# Patient Record
Sex: Female | Born: 1955 | Race: White | Hispanic: No | Marital: Married | Smoking: Current every day smoker
Health system: Southern US, Community
[De-identification: ages and names within clinical notes are randomized; demographics above are authoritative.]

## PROBLEM LIST (undated history)

## (undated) DIAGNOSIS — M797 Fibromyalgia: Secondary | ICD-10-CM

## (undated) DIAGNOSIS — M1990 Unspecified osteoarthritis, unspecified site: Secondary | ICD-10-CM

## (undated) DIAGNOSIS — R112 Nausea with vomiting, unspecified: Secondary | ICD-10-CM

## (undated) DIAGNOSIS — F419 Anxiety disorder, unspecified: Secondary | ICD-10-CM

## (undated) DIAGNOSIS — S8992XA Unspecified injury of left lower leg, initial encounter: Secondary | ICD-10-CM

## (undated) DIAGNOSIS — D169 Benign neoplasm of bone and articular cartilage, unspecified: Secondary | ICD-10-CM

## (undated) DIAGNOSIS — R102 Pelvic and perineal pain unspecified side: Secondary | ICD-10-CM

## (undated) DIAGNOSIS — Z9889 Other specified postprocedural states: Secondary | ICD-10-CM

## (undated) DIAGNOSIS — R030 Elevated blood-pressure reading, without diagnosis of hypertension: Secondary | ICD-10-CM

## (undated) HISTORY — DX: Fibromyalgia: M79.7

## (undated) HISTORY — PX: DILATION AND CURETTAGE OF UTERUS: SHX78

## (undated) HISTORY — PX: TONSILLECTOMY: SUR1361

## (undated) HISTORY — PX: TUBAL LIGATION: SHX77

## (undated) HISTORY — PX: NASAL SEPTUM SURGERY: SHX37

---

## 1998-10-25 ENCOUNTER — Other Ambulatory Visit: Admission: RE | Admit: 1998-10-25 | Discharge: 1998-10-25 | Payer: Self-pay | Admitting: Obstetrics and Gynecology

## 2000-05-02 ENCOUNTER — Other Ambulatory Visit: Admission: RE | Admit: 2000-05-02 | Discharge: 2000-05-02 | Payer: Self-pay | Admitting: Obstetrics and Gynecology

## 2002-11-01 ENCOUNTER — Other Ambulatory Visit: Admission: RE | Admit: 2002-11-01 | Discharge: 2002-11-01 | Payer: Self-pay | Admitting: Obstetrics and Gynecology

## 2003-01-25 ENCOUNTER — Observation Stay (HOSPITAL_COMMUNITY): Admission: RE | Admit: 2003-01-25 | Discharge: 2003-01-26 | Payer: Self-pay | Admitting: Obstetrics and Gynecology

## 2003-01-25 ENCOUNTER — Encounter (INDEPENDENT_AMBULATORY_CARE_PROVIDER_SITE_OTHER): Payer: Self-pay | Admitting: *Deleted

## 2003-01-25 HISTORY — PX: LAPAROSCOPIC ASSISTED VAGINAL HYSTERECTOMY: SHX5398

## 2003-11-29 ENCOUNTER — Other Ambulatory Visit: Admission: RE | Admit: 2003-11-29 | Discharge: 2003-11-29 | Payer: Self-pay | Admitting: Obstetrics and Gynecology

## 2003-12-07 ENCOUNTER — Encounter: Admission: RE | Admit: 2003-12-07 | Discharge: 2003-12-07 | Payer: Self-pay | Admitting: Obstetrics and Gynecology

## 2004-10-24 ENCOUNTER — Encounter: Admission: RE | Admit: 2004-10-24 | Discharge: 2004-10-24 | Payer: Self-pay | Admitting: Obstetrics and Gynecology

## 2005-01-24 ENCOUNTER — Other Ambulatory Visit: Admission: RE | Admit: 2005-01-24 | Discharge: 2005-01-24 | Payer: Self-pay | Admitting: Obstetrics and Gynecology

## 2006-03-27 ENCOUNTER — Other Ambulatory Visit: Admission: RE | Admit: 2006-03-27 | Discharge: 2006-03-27 | Payer: Self-pay | Admitting: Obstetrics and Gynecology

## 2008-05-31 ENCOUNTER — Ambulatory Visit: Payer: Self-pay

## 2009-12-20 ENCOUNTER — Encounter: Admission: RE | Admit: 2009-12-20 | Discharge: 2009-12-20 | Payer: Self-pay | Admitting: Obstetrics and Gynecology

## 2010-10-19 ENCOUNTER — Encounter: Admission: RE | Admit: 2010-10-19 | Discharge: 2010-10-19 | Payer: Self-pay | Admitting: Obstetrics and Gynecology

## 2011-01-05 ENCOUNTER — Encounter: Payer: Self-pay | Admitting: Obstetrics and Gynecology

## 2011-05-03 NOTE — Discharge Summary (Signed)
   NAME:  [name] [name], [name] [name]                   ACCOUNT NO.:  [id number]   MEDICAL RECORD NO.:  [id number]                   PATIENT TYPE:  OBV   LOCATION:  9305                                 FACILITY:  [facility]   PHYSICIAN:  Duke Salvia. Marcelle Overlie, M.D.            DATE OF BIRTH:  [birth year]   DATE OF ADMISSION:  01/25/2003  DATE OF DISCHARGE:  01/26/2003                                 DISCHARGE SUMMARY   DISCHARGE DIAGNOSES:  1. Menorrhagia, dysmenorrhea.  2. Laparoscopy-assisted vaginal hysterectomy this admission.   SUMMARY OF HISTORY AND PHYSICAL EXAMINATION:  Please see admission H&P for  details.  Briefly, a 55 year old G2 P2 with prior tubal ligation with a one-  year history of severe dys menorrhagia and dysmenorrhea unresponsive to  conservative measures presents now for a hysterectomy.   HOSPITAL COURSE:  On January 25, 2003 under general anesthesia the patient  underwent LAVH without complications.  The ovaries appeared to be normal and  were conserved.  On postoperative day #1 she was afebrile, tolerating a  regular diet, voiding without difficulty, and ambulating, was ready for  discharge at that point.   LABORATORY DATA:  Preoperative hemoglobin on October 14, 2002 was 14.4; on  January 24, 2003 was 14.1; on January 26, 2003 - postoperative day #1 - was  10.1.  Admission UA was negative.  Blood type O positive, antibody screen  negative.   DISPOSITION:  The patient was discharged on Tylox p.r.n. for pain.  She will  take multivitamins with iron once daily.   FOLLOW-UP:  I will see back in my office in one week.   DISCHARGE INSTRUCTIONS:  1. Advised to report any increased vaginal bleeding, dysuria, fever over     101.  2. She was given specific instructions regarding diet, sex, exercise.   CONDITION:  Good.   ACTIVITY:  Gradually increase.                                                Richard M. Marcelle Overlie, M.D.    RMH/MEDQ  D:  01/26/2003  T:  01/26/2003   Job:  [id number]

## 2011-05-03 NOTE — H&P (Signed)
[name], [name]                       ACCOUNT NO.:  [id number]   MEDICAL RECORD NO.:  [id number]                   PATIENT TYPE:   LOCATION:                                       FACILITY:   PHYSICIAN:  Duke Salvia. Marcelle [name], M.D.            DATE OF BIRTH:   DATE OF ADMISSION:  DATE OF DISCHARGE:                                HISTORY & PHYSICAL   DATE OF SCHEDULED SURGERY:  October 18, 2002 at [facility].   CHIEF COMPLAINT:  Menorrhagia and dysmenorrhea.   HISTORY OF PRESENT ILLNESS:  The patient is a 55 year old G2, P2 female,  prior tubal, history of menorrhagia and pelvic pain.  We tried, in the past,  Levlite, with some improvement.  However, because she continues to smoke,  this has not been a good long-term option.   In the last six months, her pain and menorrhagia have worsened.  HSG on  April 2002 did not show any abnormality.  She presents now for definitive  hysterectomy to correct this problem.  In addition, she has had menstrual  migraine and some problems with PMDD that she has taken Sarafem and Vioxx  for cramps in the past.   ALLERGIES:  None.   CURRENT MEDICATIONS:  1. Xanax 0.25 p.r.n.  2. Advil or Aleve p.r.n. cramping.   PAST SURGICAL HISTORY:  Prior tubal.   OB HISTORY:  Two vaginal deliveries at term without complication except for  postpartum bleeding.  She did have a D&E postpartum because of that.   SOCIAL HISTORY:  She does smoke one-half to one pack per day.   REVIEW OF SYSTEMS:  She has problems with PMDD and stress-related anxiety  problems in the past.   FAMILY HISTORY:  Father with anxiety disorder.  Father and sister both have  had hypertension.   PHYSICAL EXAMINATION:  GENERAL:  Temperature 98.2, blood pressure 120/72.  HEENT:  Unremarkable.  Neck supple without masses.  LUNGS:  Clear.  CARDIOVASCULAR:  Regular rate and rhythm without murmurs, rubs or gallops.  BREASTS:  Without masses.  ABDOMEN:  Soft, flat,  nontender.  PELVIC:  Normal external genitalia.  Vagina clear.  Uterus mid position,  normal size.  Adnexa negative.  EXTREMITIES:  Unremarkable.  NEUROLOGICAL:  Unremarkable.   IMPRESSION:  1. Menorrhagia.  2. Dysmenorrhea.   PLAN:  1. Both of these problems have not responded to conservative measures.  She     presents now for definitive LAVH.  We discussed the procedure including     risks of bleeding, infection, adjacent organ injury, transfusion and     possible need for open additional surgery were all discussed with her,     which she understands and accepts.  [facility] September 15, 2002 was normal at     3.6.  Most recent Pap smear November 2002 was normal.  [name] [name], M.D.    RMH/MEDQ  D:  10/12/2002  T:  10/13/2002  Job:  [id number]

## 2011-05-03 NOTE — Op Note (Signed)
NAME:  [name] [name], [name] [name]                   ACCOUNT NO.:  [id number]   MEDICAL RECORD NO.:  [id number]                   PATIENT TYPE:  OBV   LOCATION:  9399                                 [facility]ILITY:  [facility]   PHYSICIAN:  Duke Salvia. Marcelle Overlie, M.D.            DATE OF BIRTH:  Aug 10, 1956   DATE OF PROCEDURE:  01/25/2003  DATE OF DISCHARGE:                                 OPERATIVE REPORT   PREOPERATIVE DIAGNOSIS:  Menorrhagia and dysmenorrhea unresponsive to  conservative measures.   POSTOPERATIVE DIAGNOSIS:  Menorrhagia and dysmenorrhea unresponsive to  conservative measures.   PROCEDURE:  Laparoscopically-assisted vaginal hysterectomy.   SURGEON:  Duke Salvia. Marcelle Overlie, M.D.   ASSISTANT:  Dineen Kid. Rana Snare, M.D.   ANESTHESIA:  General endotracheal.   COMPLICATIONS:  None.   DRAINS:  Foley catheter.   ESTIMATED BLOOD LOSS:  150.   DESCRIPTION OF PROCEDURE:  Patient to the operating room.  After an adequate  level of general endotracheal anesthesia was obtained, with the patient's  legs in stirrups for laparoscopy, the abdomen, perineum, and vagina were  prepped and draped with Betadine, the bladder was drained, a UA carried out.  The uterus was midposition and normal size and mobile, adnexa negative.  A  Hulka tenaculum was positioned, attention directed to the abdomen, where a 2  cm subumbilical incision was made.  The Veress needle was inserted without  difficulty.  Its intra-abdominal position was verified by pressure and water  testing.  The trocar and sleeve were then introduced without difficulty and  the laparoscope was inserted.  There was no evidence of any bleeding or  trauma.  The patient was placed in Trendelenburg.  A 5 mm trocar was placed  three fingerbreadths above the symphysis in the midline and used to  manipulate.  The uterus itself was normal size.  Cul-de-sac and the adnexa  unremarkable.  The cecum, appendix, gallbladder, liver, and upper abdomen  were  normal.  After these findings were noted, the Gyrus tripolar instrument  was used to coagulate and cut the utero-ovarian pedicle including the round  ligament on either side with excellent hemostasis.  At this point we started  the vaginal portion of the procedure.  The legs were extended, cervix  grasped with a tenaculum, and the cervicovaginal mucosa was incised with a  scalpel.  Posterior culdotomy performed without difficulty.  The Gyrus  instrument was then used to clamp, coagulate, and then cut the uterosacral  ligaments.  The bladder was advanced superiorly with sharp and blunt  dissection until the peritoneum could be identified.  This was entered  sharply and a retractor used to gently elevate the bladder out of the field.  In a sequential manner staying close to the uterus, the cardinal ligament-  upper broad ligament pedicles were clamped, coagulated, and divided.  The  remaining upper broad ligament pedicle was then clamped, coagulated, and  divided, and the specimen was removed.  The cuff was  closed in a running  locked 2-0 Dexon suture.  A McCall culdoplasty suture of 0 Dexon was placed  from the left uterosacral ligament, picking up posterior peritoneum across  to the right uterosacral ligament, which was then tied down.  The cuff was  closed from right to left with interrupted 2-0 Monocryl sutures with  excellent hemostasis, Foley catheter positioned draining clear urine.  The  abdomen was then reinsufflated and inspected, all pedicles noted to be  hemostatic.  Prior to closure, sponge, needle, and instruments reported as  correct x2 before closure of the vaginal cuff.  Instruments were removed,  gas allowed to escape.  The deep layer was closed with 4-0 Dexon  subcuticular sutures.  She tolerated this well and went to the recovery room  in good condition.                                               Richard M. Marcelle Overlie, M.D.    RMH/MEDQ  D:  01/25/2003  T:  01/25/2003   Job:  [id number]

## 2011-05-03 NOTE — H&P (Signed)
   NAME:  [name] [name], [name] [name]                   ACCOUNT NO.:  [id number]   MEDICAL RECORD NO.:  [id number]                   PATIENT TYPE:  AMB   LOCATION:  SDC                                  [facility]ILITY:  [facility]   PHYSICIAN:  Duke Salvia. Marcelle Overlie, M.D.            DATE OF BIRTH:  20-Nov-1956   DATE OF ADMISSION:  01/25/2003  DATE OF DISCHARGE:                                HISTORY & PHYSICAL   CHIEF COMPLAINT:  Menorrhagia and dysmenorrhea.   HISTORY OF PRESENT ILLNESS:  A 55 year old G2, P2 with prior tubal ligation,  who has had a one-year history of severe menorrhagia and dysmenorrhea that  has been unresponsive to conservative management.   As part of her recent workup she had an ultrasound in 2002 that showed no  abnormality.  A Pap done  November 2003 was normal; October 2003 [facility] 3.6  with a CBC that was normal.   She presents now for LAVH.  Risks including risks of bleeding, infection,  transfusion and possible need for open additional surgery were discussed.  She prefers to preserve her ovaries if normal.  Expected recovery time and other risks such as wound infection and phlebitis  have been discussed with her.   PAST MEDICAL HISTORY:   ALLERGIES:  NONE.   OPERATIONS:  Prior tubal ligation.   OBSTETRICAL HISTORY:  Two vaginal deliveries at term.   REVIEW OF SYSTEMS:  Significant for smoking.  She has also had anxiety  disorder.   CURRENT MEDICATIONS:  1. Allegra-D p.r.n.  2. Xanax 0.25 mg p.r.n.   FAMILY HISTORY:  Significant for her father and sister both with  hypertension.  Grandfather had lung cancer and a grandmother with diabetes.   PHYSICAL EXAMINATION:  VITAL SIGNS:  Temperature 98.2, blood pressure  124/84.  HEENT:  Unremarkable.  NECK:  Supple without masses.  LUNGS:  Clear.  CARDIOVASCULAR:  Regular rate and rhythm without murmur, rubs or gallops.  BREASTS:  Without masses.  ABDOMEN:  Soft, nontender.  PELVIC:  Normal external genitalia.  __________  clear.  Cervical lumen  and  normal size.  Adnexa negative.  EXTREMITIES:  Unremarkable.  NEUROLOGIC:  Unremarkable.    IMPRESSION:  Menorrhagia and dysmenorrhea; unresponsive to conservative  measures.   PLAN:  Laparoscopically-assisted vaginal hysterectomy.  The procedure and  risks were reviewed as above.                                               Richard M. Marcelle Overlie, M.D.    RMH/MEDQ  D:  01/19/2003  T:  01/20/2003  Job:  [id number]

## 2012-03-31 ENCOUNTER — Other Ambulatory Visit: Payer: Self-pay | Admitting: Orthopedic Surgery

## 2012-03-31 DIAGNOSIS — M79651 Pain in right thigh: Secondary | ICD-10-CM

## 2012-04-02 ENCOUNTER — Ambulatory Visit
Admission: RE | Admit: 2012-04-02 | Discharge: 2012-04-02 | Disposition: A | Discharge status: Home / Self Care | Payer: BC Managed Care – PPO | Source: Ambulatory Visit | Attending: Orthopedic Surgery | Admitting: Orthopedic Surgery

## 2012-04-02 DIAGNOSIS — M79651 Pain in right thigh: Secondary | ICD-10-CM

## 2012-04-02 MED ORDER — IOHEXOL 300 MG/ML  SOLN
100.0000 mL | Freq: Once | INTRAMUSCULAR | Status: AC | PRN
  Administered 2012-04-02: 100 mL via INTRAVENOUS

## 2012-04-07 DIAGNOSIS — M898X9 Other specified disorders of bone, unspecified site: Secondary | ICD-10-CM | POA: Insufficient documentation

## 2012-04-07 DIAGNOSIS — D169 Benign neoplasm of bone and articular cartilage, unspecified: Secondary | ICD-10-CM | POA: Insufficient documentation

## 2012-09-01 ENCOUNTER — Ambulatory Visit (INDEPENDENT_AMBULATORY_CARE_PROVIDER_SITE_OTHER): Payer: BC Managed Care – PPO | Admitting: Family Medicine

## 2012-09-01 ENCOUNTER — Encounter: Payer: Self-pay | Admitting: Family Medicine

## 2012-09-01 ENCOUNTER — Encounter: Payer: Self-pay | Admitting: Internal Medicine

## 2012-09-01 VITALS — BP 140/70 | HR 68 | Temp 98.0°F | Ht 68.0 in | Wt 162.0 lb

## 2012-09-01 DIAGNOSIS — Z720 Tobacco use: Secondary | ICD-10-CM | POA: Insufficient documentation

## 2012-09-01 DIAGNOSIS — IMO0001 Reserved for inherently not codable concepts without codable children: Secondary | ICD-10-CM

## 2012-09-01 DIAGNOSIS — M797 Fibromyalgia: Secondary | ICD-10-CM

## 2012-09-01 DIAGNOSIS — Z1211 Encounter for screening for malignant neoplasm of colon: Secondary | ICD-10-CM

## 2012-09-01 DIAGNOSIS — F17200 Nicotine dependence, unspecified, uncomplicated: Secondary | ICD-10-CM

## 2012-09-01 HISTORY — DX: Tobacco use: Z72.0

## 2012-09-01 NOTE — Patient Instructions (Addendum)
It is great to meet you. Please stop by to see Shirlee Limerick on your way out to set up your colonoscopy.  Please let me know ir or when you're ready to try Chantix.

## 2012-09-01 NOTE — Progress Notes (Signed)
Subjective:    Patient ID: [name] [name], female    DOB: [birth year], 56 y.o.   MRN: [id number]  HPI 56 yo here to establish care.  Pt has not seen a PCP in 4 years but has been having yearly gyn exams at [facility] GYN [facility]  Last pap and GYN exam was in 12/2011.  Left foot fx- followed by Dr. Cleophas Dunker and Dr. Madelon Lips. 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  Tobacco abuse- smokes 6-7 cigs per day.  Quit for five years using gum. Has never tried Chantix.    Fibromylgia- followed by Dr. Corliss Skains.  Recently started the supplements suggested by Dr. Corliss Skains and feels symptoms are improving. Symptoms mainly occur in hips and shoulders. Tramadol does help with pain as well.  Patient Active Problem List  Diagnosis  . Tobacco abuse  . Fibromyalgia   Past Medical History  Diagnosis Date  . Fibromyalgia    Past Surgical History  Procedure Date  . Abdominal hysterectomy   . Tonsillectomy    History  Substance Use Topics  . Smoking status: Current Every Day Smoker  . Smokeless tobacco: Not on file  . Alcohol Use: Not on file   No family history on file. Allergies  Allergen Reactions  . Demerol (Meperidine) Nausea And Vomiting  . Sulfa Antibiotics Rash   Current Outpatient Prescriptions on File Prior to Visit  Medication Sig Dispense Refill  . calcium carbonate (OS-CAL) 600 MG TABS Take 600 mg by mouth 2 (two) times daily with a meal.       The PMH, PSH, Social History, Family History, Medications, and allergies have been reviewed in [facility], and have been updated if relevant.    Review of Systems    See HPI Patient reports no  vision/ hearing changes,anorexia, weight change, fever ,adenopathy, persistant / recurrent hoarseness, swallowing issues, chest pain, edema,persistant / recurrent cough, hemoptysis, dyspnea(rest, exertional, paroxysmal nocturnal), gastrointestinal  bleeding (melena, rectal bleeding), abdominal pain, excessive  heart burn, GU symptoms(dysuria, hematuria, pyuria, voiding/incontinence  Issues) syncope, focal weakness, severe memory loss, concerning skin lesions, depression, anxiety, abnormal bruising/bleeding, major joint swelling, breast masses or abnormal vaginal bleeding.    Objective:   Physical Exam BP 140/70  Pulse 68  Temp 98 F (36.7 C)  Ht 5\' 8"  (1.727 m)  Wt 162 lb (73.483 kg)  BMI 24.63 kg/m2  General:  Well-developed,well-nourished,in no acute distress; alert,appropriate and cooperative throughout examination Head:  normocephalic and atraumatic.   Eyes:  vision grossly intact, pupils equal, pupils round, and pupils reactive to light.   Ears:  R ear normal and L ear normal.   Nose:  no external deformity.   Mouth:  good dentition.  .   Lungs:  Normal respiratory effort, chest expands symmetrically. Lungs are clear to auscultation, no crackles or wheezes. Heart:  Normal rate and regular rhythm. S1 and S2 normal without gallop, murmur, click, rub or other extra sounds. Msk:  No deformity or scoliosis noted of thoracic or lumbar spine.   Extremities:  No clubbing, cyanosis, edema, or deformity noted with normal full range of motion of all joints.   Neurologic:  alert & oriented X3 and gait normal.   Skin:  Intact without suspicious lesions or rashes Psych:  Cognition and judgment appear intact. Alert and cooperative with normal attention span and concentration. No apparent delusions, illusions, hallucinations        Assessment & Plan:   1. Screening for colon cancer  Ambulatory  referral to Gastroenterology  2. Tobacco abuse  Not quite ready to quit. Discussed Chantix. She would like to think about it.   3. Fibromyalgia  Newer diagnosis. Followed by Dr. Corliss Skains.  Taking supplements which appear to be helping.

## 2012-09-17 ENCOUNTER — Ambulatory Visit (AMBULATORY_SURGERY_CENTER): Payer: BC Managed Care – PPO | Admitting: *Deleted

## 2012-09-17 VITALS — Ht 68.0 in | Wt 163.5 lb

## 2012-09-17 DIAGNOSIS — Z1211 Encounter for screening for malignant neoplasm of colon: Secondary | ICD-10-CM

## 2012-09-17 MED ORDER — NA SULFATE-K SULFATE-MG SULF 17.5-3.13-1.6 GM/177ML PO SOLN: ORAL | Status: DC

## 2012-09-18 ENCOUNTER — Encounter: Payer: Self-pay | Admitting: Internal Medicine

## 2012-09-29 ENCOUNTER — Encounter: Payer: Self-pay | Admitting: Family Medicine

## 2012-09-29 ENCOUNTER — Ambulatory Visit (INDEPENDENT_AMBULATORY_CARE_PROVIDER_SITE_OTHER): Payer: BC Managed Care – PPO | Admitting: Family Medicine

## 2012-09-29 VITALS — BP 130/76 | HR 75 | Temp 98.3°F | Wt 162.8 lb

## 2012-09-29 DIAGNOSIS — J069 Acute upper respiratory infection, unspecified: Secondary | ICD-10-CM

## 2012-09-29 MED ORDER — FEXOFENADINE-PSEUDOEPHED ER 60-120 MG PO TB12: 1.0000 | ORAL_TABLET | Freq: Two times a day (BID) | ORAL | Status: DC

## 2012-09-29 NOTE — Assessment & Plan Note (Signed)
Likely viral.  Use allegra D for congestion and rhinorrhea.  Should resolve.  Will have pt notify GI clinic tomorrow with update.  I'll defer to GI's opinion.  Her lungs are clear.  Nontoxic. Will notify PCP as FYI.  D/w pt.  She understood.  Out of work today and tomorrow.

## 2012-09-29 NOTE — Patient Instructions (Signed)
Call Dr. Marvell Fuller office tomorrow AM with an update.  See if he wants to proceed with the colonoscopy.  Take allegra D in the meantime and get some rest.  This is likely a virus that should resolve within 7-10 days.

## 2012-09-29 NOTE — Progress Notes (Signed)
Mult sick contacts at work duration of symptoms: ~2 days rhinorrhea:yes congestion:yes ear pain:yes, left Some facial pain sore throat: yes Cough: no Myalgias: not more than typical.   other concerns: headache.  Scheduled for colonoscopy on Thursday with Dr. Leone Payor.   She had a flu shot at work.   Had some allegra D at home, used with some relief.  It helped the HA and congestion.    ROS: See HPI.  Otherwise negative.    Meds, vitals, and allergies reviewed.   GEN: nad, alert and oriented HEENT: mucous membranes moist, TM w/o erythema, nasal epithelium injected, OP with cobblestoning, max/frontal sinuses mildly ttp x4 NECK: supple w/o LA CV: rrr. PULM: ctab, no inc wob ABD: soft, +bs EXT: no edema

## 2012-09-30 ENCOUNTER — Telehealth: Payer: Self-pay | Admitting: Internal Medicine

## 2012-09-30 NOTE — Telephone Encounter (Signed)
Pt saw her family MD yesterday and was dx with a URI and has chills, aches and coughs this morning.  She states, "I don't think I can make it tomorrow."  Her appt is changed to 10-14-12 at 9:00 and instructions reviewed with pt.

## 2012-10-01 ENCOUNTER — Encounter: Payer: BC Managed Care – PPO | Admitting: Internal Medicine

## 2012-10-14 ENCOUNTER — Ambulatory Visit (AMBULATORY_SURGERY_CENTER): Payer: BC Managed Care – PPO | Admitting: Internal Medicine

## 2012-10-14 ENCOUNTER — Encounter: Payer: Self-pay | Admitting: Internal Medicine

## 2012-10-14 VITALS — BP 120/72 | HR 79 | Temp 98.2°F | Resp 18 | Ht 68.0 in | Wt 163.0 lb

## 2012-10-14 DIAGNOSIS — Z1211 Encounter for screening for malignant neoplasm of colon: Secondary | ICD-10-CM

## 2012-10-14 MED ORDER — SODIUM CHLORIDE 0.9 % IV SOLN: 500.0000 mL | INTRAVENOUS | Status: DC

## 2012-10-14 NOTE — Progress Notes (Signed)
Patient did not experience any of the following events: a burn prior to discharge; a fall within the facility; wrong site/side/patient/procedure/implant event; or a hospital transfer or hospital admission upon discharge from the facility. (G8907) Patient did not have preoperative order for IV antibiotic SSI prophylaxis. (G8918)  

## 2012-10-14 NOTE — Op Note (Signed)
[facility] [address] [place], [address]   COLONOSCOPY PROCEDURE REPORT  PATIENT: [name], [name]  MR#: [id number] BIRTHDATE: 1956-07-11 , 56  yrs. old GENDER: Female ENDOSCOPIST: Iva Boop, MD, [facility] REFERRED NF:AOZHY Aron, M.D. PROCEDURE DATE:  10/14/2012 PROCEDURE:   Colonoscopy, diagnostic ASA CLASS:   Class II INDICATIONS:average risk screening. MEDICATIONS: Propofol (Diprivan) 280 mg IV, MAC sedation, administered by CRNA, and These medications were titrated to patient response per physician's verbal order  DESCRIPTION OF PROCEDURE:   After the risks benefits and alternatives of the procedure were thoroughly explained, informed consent was obtained.  A digital rectal exam revealed no abnormalities of the rectum.   The LB [id number]  endoscope was introduced through the anus and advanced to the cecum, which was identified by both the appendix and ileocecal valve. No adverse events experienced.   The quality of the prep was Suprep excellent The instrument was then slowly withdrawn as the colon was fully examined.      COLON FINDINGS: A normal appearing cecum, ileocecal valve, and appendiceal orifice were identified.  The ascending, hepatic flexure, transverse, splenic flexure, descending, sigmoid colon and rectum appeared unremarkable.  No polyps or cancers were seen. Retroflexed views in right colon and rectum revealed no abnormalities. The time to cecum=2 minutes 37 seconds.  Withdrawal time=7 minutes 05 seconds.  The scope was withdrawn and the procedure completed. COMPLICATIONS: There were no complications.  ENDOSCOPIC IMPRESSION: Normal colon with excellent prep  RECOMMENDATIONS: Repeat Colonscopy in 10 years.   eSigned:  Iva Boop, MD, [facility] 10/14/2012 9:35 AM   cc: Enos Fling MD and The Patient

## 2012-10-14 NOTE — Patient Instructions (Addendum)
The colon was normal and you had an excellent prep!  Repeat routine colonoscopy recommended in about 10 years.  Thank you for choosing me and [facility].  Iva Boop, MD, FACG   YOU HAD AN ENDOSCOPIC PROCEDURE TODAY AT THE [facility]: Refer to the procedure report that was given to you for any specific questions about what was found during the examination.  If the procedure report does not answer your questions, please call your gastroenterologist to clarify.  If you requested that your care partner not be given the details of your procedure findings, then the procedure report has been included in a sealed envelope for you to review at your convenience later.  YOU SHOULD EXPECT: Some feelings of bloating in the abdomen. Passage of more gas than usual.  Walking can help get rid of the air that was put into your GI tract during the procedure and reduce the bloating. If you had a lower endoscopy (such as a colonoscopy or flexible sigmoidoscopy) you may notice spotting of blood in your stool or on the toilet paper. If you underwent a bowel prep for your procedure, then you may not have a normal bowel movement for a few days.  DIET: Your first meal following the procedure should be a light meal and then it is ok to progress to your normal diet.  A half-sandwich or bowl of soup is an example of a good first meal.  Heavy or fried foods are harder to digest and may make you feel nauseous or bloated.  Likewise meals heavy in dairy and vegetables can cause extra gas to form and this can also increase the bloating.  Drink plenty of fluids but you should avoid alcoholic beverages for 24 hours.  ACTIVITY: Your care partner should take you home directly after the procedure.  You should plan to take it easy, moving slowly for the rest of the day.  You can resume normal activity the day after the procedure however you should NOT DRIVE or use heavy machinery for 24 hours (because of  the sedation medicines used during the test).    SYMPTOMS TO REPORT IMMEDIATELY: A gastroenterologist can be reached at any hour.  During normal business hours, 8:30 AM to 5:00 PM Monday through Friday, call [phone].  After hours and on weekends, please call the GI answering service at [phone] who will take a message and have the physician on call contact you.   Following lower endoscopy (colonoscopy or flexible sigmoidoscopy):  Excessive amounts of blood in the stool  Significant tenderness or worsening of abdominal pains  Swelling of the abdomen that is new, acute  Fever of 100F or higher.  FOLLOW UP: If any biopsies were taken you will be contacted by phone or by letter within the next 1-3 weeks.  Call your gastroenterologist if you have not heard about the biopsies in 3 weeks.  Our staff will call the home number listed on your records the next business day following your procedure to check on you and address any questions or concerns that you may have at that time regarding the information given to you following your procedure. This is a courtesy call and so if there is no answer at the home number and we have not heard from you through the emergency physician on call, we will assume that you have returned to your regular daily activities without incident.  SIGNATURES/CONFIDENTIALITY: You and/or your care partner have signed paperwork which will be entered  into your electronic medical record.  These signatures attest to the fact that that the information above on your After Visit Summary has been reviewed and is understood.  Full responsibility of the confidentiality of this discharge information lies with you and/or your care-partner.   Resume medications.

## 2012-10-14 NOTE — Progress Notes (Signed)
Transferred to PACU-VSS Spont resp,alert and oriented X3 Report to RN

## 2012-10-14 NOTE — Progress Notes (Signed)
Pt. C/o right sided pain . Pt. Expelling air and repositioned from side to side.abdomen soft. DR. Leone Payor made aware and in to evaluate pt. At 10:02. Physician stated she is soft and passing air,it is benign order to d/c pt. Received. 1008 pt. Stated that the pain is better. Fluids given to pt. And is tolerating without difficulty.

## 2012-10-15 ENCOUNTER — Telehealth: Payer: Self-pay | Admitting: *Deleted

## 2012-10-15 NOTE — Telephone Encounter (Signed)
  Follow up Call-  Call back number 10/14/2012  Post procedure Call Back phone  # [phone]  work    [phone] cell  OK to leave message on cell  Permission to leave phone message No  comments pt. states she will answer the phone     Patient questions:  Called both numbers and no one answered; did not leave a message.

## 2012-12-23 ENCOUNTER — Other Ambulatory Visit: Payer: Self-pay | Admitting: Obstetrics and Gynecology

## 2012-12-23 DIAGNOSIS — N644 Mastodynia: Secondary | ICD-10-CM

## 2012-12-31 ENCOUNTER — Ambulatory Visit
Admission: RE | Admit: 2012-12-31 | Discharge: 2012-12-31 | Disposition: A | Discharge status: Home / Self Care | Payer: BC Managed Care – PPO | Source: Ambulatory Visit | Attending: Obstetrics and Gynecology | Admitting: Obstetrics and Gynecology

## 2012-12-31 DIAGNOSIS — N644 Mastodynia: Secondary | ICD-10-CM

## 2013-01-13 ENCOUNTER — Telehealth: Payer: Self-pay | Admitting: Family Medicine

## 2013-01-13 NOTE — Telephone Encounter (Signed)
Patient Information:  Caller Name: Cera  Phone: [phone]  Patient: [name], [name]  Gender: Female  DOB: 1956/02/25  Age: 57 Years  PCP: Kerby Nora (Family Practice)  Pregnant: No  Office Follow Up:  Does the office need to follow up with this patient?: Yes  Instructions For The Office: refuses an appt, wants medication   Symptoms  Reason For Call & Symptoms: Patient calling about a headache, nasal swelling, facial pain.  Has been around of alot of dust at her office.  No fever.  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  "I know my body."  Offered appointment, she refused.   Suggested Tylenol for her headache, warm compresses and to call back for appointment time.  Her husband works for himself and has already left the house.  She is going to see if he will bring her to the office later today.

## 2013-01-14 ENCOUNTER — Encounter: Payer: Self-pay | Admitting: Family Medicine

## 2013-01-14 ENCOUNTER — Ambulatory Visit (INDEPENDENT_AMBULATORY_CARE_PROVIDER_SITE_OTHER): Payer: BC Managed Care – PPO | Admitting: Family Medicine

## 2013-01-14 VITALS — BP 140/88 | HR 78 | Temp 97.8°F | Wt 164.0 lb

## 2013-01-14 DIAGNOSIS — J309 Allergic rhinitis, unspecified: Secondary | ICD-10-CM

## 2013-01-14 MED ORDER — FLUTICASONE PROPIONATE 50 MCG/ACT NA SUSP: 2.0000 | Freq: Every day | NASAL | Status: DC

## 2013-01-14 NOTE — Progress Notes (Signed)
  Subjective:    Patient ID: [name] [name], female    DOB: [birth year], 57 y.o.   MRN: [id number]  HPI  57 yo current smoker here to discuss sinus problems.  At work, she has been inhaling particles from sheet rock due to recent renovations.  Construction started two days ago. Since then, her sinuses feel swollen, she has facial pressure.  Nose is running. No fevers. No cough.  No SOB.  Not taking anything OTC.  Patient Active Problem List  Diagnosis  . Tobacco abuse  . Fibromyalgia   Past Medical History  Diagnosis Date  . Fibromyalgia   . 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 Abdominal hysterectomy   . Tonsillectomy   . Dilation and curettage of uterus 1986, 1988   History  Substance Use Topics  . Smoking status: Current Every Day Smoker -- 0.5 packs/day    Types: Cigarettes  . Smokeless tobacco: Never Used  . Alcohol Use: Yes     Comment: rare   Family History  Problem Relation Age of Onset  . Colon cancer Neg Hx   . Stomach cancer Neg Hx    Allergies  Allergen Reactions  . Demerol (Meperidine) Nausea And Vomiting  . Sulfa Antibiotics Rash   Current Outpatient Prescriptions on File Prior to Visit  Medication Sig Dispense Refill  . ALPRAZolam (XANAX) 0.25 MG tablet Take 0.25 mg by mouth at bedtime as needed.      . calcium carbonate (OS-CAL) 600 MG TABS Take 600 mg by mouth 2 (two) times daily with a meal.      . Cholecalciferol (VITAMIN D-400 PO) Take one by mouth twice daily      . Coenzyme Q10 (COQ10) 200 MG CAPS Take 1 capsule by mouth daily.      . fexofenadine-pseudoephedrine (ALLEGRA-D 12 HOUR) 60-120 MG per tablet Take 1 tablet by mouth 2 (two) times daily.      Marland Kitchen MAGNESIUM PO Take 1000 mg's three times a day with meals.      . thiamine (VITAMIN B-1) 100 MG tablet Take 100 mg by mouth daily.      . traMADol (ULTRAM) 50 MG tablet Take one to two by mouth at bedtime as needed.      . fluticasone (FLONASE) 50 MCG/ACT nasal  spray Place 2 sprays into the nose daily.  16 g  6   The PMH, PSH, Social History, Family History, Medications, and allergies have been reviewed in [facility], and have been updated if relevant.   Review of Systems + HA    Objective:   Physical Exam BP 140/88  Pulse 78  Temp 97.8 F (36.6 C)  Wt 164 lb (74.39 kg) Gen: alert, pleasant, NAD HEENT: Nasal erythema, no edema, sinuses TTP- maxillary, bilateral Resp: CTA bilaterally CVS:  RRR Ext:  No edema    Assessment & Plan:  1. Sinusitis- I suspect due to allergen exposure and is not infectious at this point. Start Flonase, advised wearing a mask at work.  Start Allegra or Zyrtec D. Call or return to clinic prn if these symptoms worsen or fail to improve as anticipated. The patient indicates understanding of these issues and agrees with the plan.

## 2013-01-14 NOTE — Patient Instructions (Signed)
This seems like an allergic reaction from what you are inhaling at work.  We are starting Flonase (prescription sent to your pharmacy). Also try claritin D or zyrtec D over the counter- two times a day as needed ( have to sign for them at pharmacy). You can use warm compresses.  Call if not improving as expected in 5-7 days.

## 2013-08-03 ENCOUNTER — Telehealth: Payer: Self-pay

## 2013-08-03 NOTE — Telephone Encounter (Signed)
Agreed -

## 2013-08-03 NOTE — Telephone Encounter (Signed)
Pt walked in; pt seen at orthopedic dr this morning with knee pain, pain now is 6. Pt went back to work BP was 165/85 P 102. Pt complained at work with h/a, numbness in lt arm and ? Heaviness or chest pain lt side of chest. Pt said she cannot describe how she feels but is not as bad now as when she was at work. Pt took Xanax 1:30 pm. Now slight heaviness or pain in lt chest ; no SOB, dizziness, no radiation of chest discomfort down her arm or into neck, no N&V, no sweating. Pt said under a lot of stress; workers comp case for knee injury for 1 year and still having pain, 2 daughters that are pregnant and other stress factors. Pt has had numbness in lower lt arm on and off 2-3 months. BP 138/82 P90 pulse ox 98%. Dr Ermalene Searing advised if pt is better can schedule appt in AM (pt does not appear in any distress) and if condition changes or worsens tonight pt to go to [facility] or ED. Pt voiced understanding and scheduled appt with Dr Dayton Martes 08/04/13 at 9 am.Took BP again 136/82 and P 77; pulse ox 98%.

## 2013-08-04 ENCOUNTER — Ambulatory Visit: Payer: BC Managed Care – PPO | Admitting: Family Medicine

## 2013-08-04 DIAGNOSIS — Z0289 Encounter for other administrative examinations: Secondary | ICD-10-CM

## 2014-01-25 ENCOUNTER — Other Ambulatory Visit: Payer: Self-pay | Admitting: Obstetrics and Gynecology

## 2014-03-24 ENCOUNTER — Telehealth: Payer: Self-pay | Admitting: Internal Medicine

## 2014-03-24 NOTE — Telephone Encounter (Signed)
Patient reports that she is having gas and bloating.  She recently had a colposcopy and is scheduled for exploratory GYN surgery in a few weeks.  She is not having any other complaints except for some occasional constipation.  She also stated she is making "smoothies" with fresh fruits and vegetables a few times a week.  She is advised to try a low gas diet and I have mailed her information on this.  She is also advised to try gax -x or phazyme for the excess gas and Miralax 1-2 times a day.  She will try the above and if her symptoms don't improve she will call back.

## 2014-03-28 NOTE — Telephone Encounter (Signed)
agree

## 2014-04-07 NOTE — H&P (Signed)
[name] [name]  DICTATION # [id number] CSN# [id number]   Margarette Asal, MD 04/07/2014 2:29 PM

## 2014-04-08 NOTE — H&P (Signed)
[name] [name], [name] [name]                ACCOUNT NO.:  [id number]  MEDICAL RECORD NO.:  [id number]  LOCATION:                                 FACILITY:  PHYSICIAN:  Ralene Bathe. [name] [name], M.D.DATE OF BIRTH:  19-Nov-1956  DATE OF ADMISSION:  04/18/2014 DATE OF DISCHARGE:  04/18/2014                             HISTORY & PHYSICAL   CHIEF COMPLAINT:  Pelvic pain.  HISTORY OF PRESENT ILLNESS:  A 58 year old, G2, P2, this patient had LAVH in 2004, with normal ovarian findings at that time.  In the last 6 months, she has experienced increased pelvic pain, has undergone ultrasound in our office, March 15 that was normal.  We had discussed other imaging, such as CT which she declined preferring to proceed with diagnostic laparoscopy with possible lysis of adhesions or BSO, depending on the findings at that time.  This procedure including specific risks related to bleeding, infection, other complications may require additional surgery or open surgery reviewed with her.  Other risks related to wound infection, phlebitis, need for ERT, and her expected recovery time reviewed also.  Note that she had a colpo with biopsy of __________ scheduled to have a Pap repeated at 66-month interval.  PAST MEDICAL HISTORY:  ALLERGIES:  DEMEROL, SULFA, AND INHALANT ALLERGIES SUCH AS DUST, MOLD, AND __________.  CURRENT MEDICATIONS:  She is on calcium and magnesium supplements, __________CoQ10, and takes Xanax by prescription p.r.n.  SURGICAL HISTORY:  Tonsillectomy, repair of deviated septum, LAVH in 2004, 2 prior D and Cs, she has also had 2 vaginal deliveries.  REVIEW OF SYSTEMS:  Significant for borderline hypertension and fibromyalgia.  FAMILY HISTORY:  Positive for asthma, UTI, mother and sister with diabetes, father has hypertension and also an unspecified cancer.  SOCIAL HISTORY:  Smokes one-half PPD.  Denies drug.  Rare alcohol use. She is married.  Dr. __________ is her PCP.  PHYSICAL  EXAMINATION:  VITAL SIGNS:  Temp 98.2, blood pressure 130/72. HEENT:  Unremarkable. NECK:  Supple without masses. LUNGS:  Clear. CARDIOVASCULAR:  Regular rate and rhythm without murmurs, rubs, gallops noted. BREASTS:  Without masses. ABDOMEN:  Soft, flat, nontender. GU:  Vulva, vagina, vaginal cuff normal.  Bimanual, no unusual masses or nodularity, slightly tender on exam. EXTREMITIES:  Unremarkable. NEUROLOGIC EXAM:  Unremarkable.  IMPRESSION:  Chronic pelvic pain, rule out adnexal adhesions.  PLAN:  Laparoscopy with possible BSO, procedure and risks discussed as above.     [name] [name], M.D.     [facility]MED[facility]  D:  04/07/2014  T:  04/08/2014  Job:  [id number]

## 2014-04-14 ENCOUNTER — Encounter (HOSPITAL_BASED_OUTPATIENT_CLINIC_OR_DEPARTMENT_OTHER): Payer: Self-pay | Admitting: *Deleted

## 2014-04-15 ENCOUNTER — Encounter (HOSPITAL_BASED_OUTPATIENT_CLINIC_OR_DEPARTMENT_OTHER): Payer: Self-pay | Admitting: *Deleted

## 2014-04-15 NOTE — Progress Notes (Signed)
NPO AFTER MN. ARRIVE AT 1021.  NEEDS CBC.  MAY TAKE TRAMADOL / TYLENOL IF NEEDED W/ SIPS OF WATER AM DOS.

## 2014-04-17 ENCOUNTER — Encounter (HOSPITAL_BASED_OUTPATIENT_CLINIC_OR_DEPARTMENT_OTHER): Payer: Self-pay | Admitting: Anesthesiology

## 2014-04-17 NOTE — Anesthesia Preprocedure Evaluation (Addendum)
Anesthesia Evaluation  Patient identified by MRN, date of birth, ID band Patient awake    Reviewed: Allergy & Precautions, H&P , NPO status , Patient's Chart, lab work & pertinent test results  History of Anesthesia Complications (+) PONV and history of anesthetic complications  Airway Mallampati: II TM Distance: >3 FB Neck ROM: Full    Dental no notable dental hx.  C/O right lower rear molar crack and temperature sensitiivity:   Pulmonary Current Smoker,  breath sounds clear to auscultation  Pulmonary exam normal       Cardiovascular negative cardio ROS  Rhythm:Regular Rate:Normal     Neuro/Psych Anxiety  Neuromuscular disease    GI/Hepatic negative GI ROS, Neg liver ROS,   Endo/Other  negative endocrine ROS  Renal/GU negative Renal ROS  negative genitourinary   Musculoskeletal  (+) Fibromyalgia -  Abdominal   Peds negative pediatric ROS (+)  Hematology negative hematology ROS (+)   Anesthesia Other Findings   Reproductive/Obstetrics negative OB ROS                          Anesthesia Physical Anesthesia Plan  ASA: II  Anesthesia Plan: General   Post-op Pain Management:    Induction: Intravenous  Airway Management Planned: Oral ETT  Additional Equipment:   Intra-op Plan:   Post-operative Plan: Extubation in OR  Informed Consent: I have reviewed the patients History and Physical, chart, labs and discussed the procedure including the risks, benefits and alternatives for the proposed anesthesia with the patient or authorized representative who has indicated his/her understanding and acceptance.   Dental advisory given  Plan Discussed with: CRNA  Anesthesia Plan Comments:         Anesthesia Quick Evaluation

## 2014-04-18 ENCOUNTER — Ambulatory Visit (HOSPITAL_BASED_OUTPATIENT_CLINIC_OR_DEPARTMENT_OTHER)
Admission: RE | Admit: 2014-04-18 | Discharge: 2014-04-18 | Disposition: A | Discharge status: Home / Self Care | Payer: BC Managed Care – PPO | Source: Ambulatory Visit | Attending: Obstetrics and Gynecology | Admitting: Obstetrics and Gynecology

## 2014-04-18 ENCOUNTER — Encounter (HOSPITAL_BASED_OUTPATIENT_CLINIC_OR_DEPARTMENT_OTHER): Payer: BC Managed Care – PPO | Admitting: Anesthesiology

## 2014-04-18 ENCOUNTER — Encounter (HOSPITAL_BASED_OUTPATIENT_CLINIC_OR_DEPARTMENT_OTHER)
Admission: RE | Disposition: A | Discharge status: Home / Self Care | Payer: Self-pay | Source: Ambulatory Visit | Attending: Obstetrics and Gynecology

## 2014-04-18 ENCOUNTER — Ambulatory Visit (HOSPITAL_BASED_OUTPATIENT_CLINIC_OR_DEPARTMENT_OTHER): Payer: BC Managed Care – PPO | Admitting: Anesthesiology

## 2014-04-18 ENCOUNTER — Encounter (HOSPITAL_BASED_OUTPATIENT_CLINIC_OR_DEPARTMENT_OTHER): Payer: Self-pay | Admitting: *Deleted

## 2014-04-18 DIAGNOSIS — N80109 Endometriosis of ovary, unspecified side, unspecified depth: Secondary | ICD-10-CM | POA: Insufficient documentation

## 2014-04-18 DIAGNOSIS — Z885 Allergy status to narcotic agent status: Secondary | ICD-10-CM | POA: Insufficient documentation

## 2014-04-18 DIAGNOSIS — N736 Female pelvic peritoneal adhesions (postinfective): Secondary | ICD-10-CM | POA: Insufficient documentation

## 2014-04-18 DIAGNOSIS — N801 Endometriosis of ovary: Secondary | ICD-10-CM | POA: Insufficient documentation

## 2014-04-18 DIAGNOSIS — Z882 Allergy status to sulfonamides status: Secondary | ICD-10-CM | POA: Insufficient documentation

## 2014-04-18 DIAGNOSIS — F17200 Nicotine dependence, unspecified, uncomplicated: Secondary | ICD-10-CM | POA: Insufficient documentation

## 2014-04-18 DIAGNOSIS — IMO0001 Reserved for inherently not codable concepts without codable children: Secondary | ICD-10-CM | POA: Insufficient documentation

## 2014-04-18 DIAGNOSIS — F411 Generalized anxiety disorder: Secondary | ICD-10-CM | POA: Insufficient documentation

## 2014-04-18 HISTORY — DX: Anxiety disorder, unspecified: F41.9

## 2014-04-18 HISTORY — DX: Benign neoplasm of bone and articular cartilage, unspecified: D16.9

## 2014-04-18 HISTORY — DX: Unspecified injury of left lower leg, initial encounter: S89.92XA

## 2014-04-18 HISTORY — DX: Unspecified osteoarthritis, unspecified site: M19.90

## 2014-04-18 HISTORY — DX: Pelvic and perineal pain: R10.2

## 2014-04-18 HISTORY — PX: SALPINGOOPHORECTOMY: SHX82

## 2014-04-18 HISTORY — DX: Pelvic and perineal pain unspecified side: R10.20

## 2014-04-18 HISTORY — DX: Nausea with vomiting, unspecified: R11.2

## 2014-04-18 HISTORY — DX: Elevated blood-pressure reading, without diagnosis of hypertension: R03.0

## 2014-04-18 HISTORY — DX: Other specified postprocedural states: Z98.890

## 2014-04-18 HISTORY — PX: LAPAROSCOPY: SHX197

## 2014-04-18 LAB — CBC
HCT: 44.6 % (ref 36.0–46.0)
Hemoglobin: 14.9 g/dL (ref 12.0–15.0)
MCH: 30.4 pg (ref 26.0–34.0)
MCHC: 33.4 g/dL (ref 30.0–36.0)
MCV: 91 fL (ref 78.0–100.0)
Platelets: 313 10*3/uL (ref 150–400)
RBC: 4.9 MIL/uL (ref 3.87–5.11)
RDW: 12.7 % (ref 11.5–15.5)
WBC: 7 10*3/uL (ref 4.0–10.5)

## 2014-04-18 SURGERY — LAPAROSCOPY, DIAGNOSTIC
Anesthesia: General | Site: Pelvis

## 2014-04-18 MED ORDER — MIDAZOLAM HCL 2 MG/2ML IJ SOLN
INTRAMUSCULAR | Status: AC
  Filled 2014-04-18: qty 2

## 2014-04-18 MED ORDER — PROMETHAZINE HCL 25 MG/ML IJ SOLN
6.2500 mg | INTRAMUSCULAR | Status: DC | PRN
  Filled 2014-04-18: qty 1

## 2014-04-18 MED ORDER — CEFOTETAN DISODIUM 2 G IJ SOLR
2.0000 g | INTRAMUSCULAR | Status: AC
  Administered 2014-04-18: 2 g via INTRAVENOUS
  Filled 2014-04-18: qty 2

## 2014-04-18 MED ORDER — LACTATED RINGERS IV SOLN
INTRAVENOUS | Status: DC
  Administered 2014-04-18 (×3): via INTRAVENOUS
  Filled 2014-04-18: qty 1000

## 2014-04-18 MED ORDER — FENTANYL CITRATE 0.05 MG/ML IJ SOLN
INTRAMUSCULAR | Status: AC
  Filled 2014-04-18: qty 6

## 2014-04-18 MED ORDER — ACETAMINOPHEN 10 MG/ML IV SOLN
INTRAVENOUS | Status: DC | PRN
  Administered 2014-04-18: 1000 mg via INTRAVENOUS

## 2014-04-18 MED ORDER — DEXAMETHASONE SODIUM PHOSPHATE 4 MG/ML IJ SOLN
INTRAMUSCULAR | Status: DC | PRN
  Administered 2014-04-18: 10 mg via INTRAVENOUS

## 2014-04-18 MED ORDER — FENTANYL CITRATE 0.05 MG/ML IJ SOLN
INTRAMUSCULAR | Status: AC
  Filled 2014-04-18: qty 2

## 2014-04-18 MED ORDER — PROPOFOL 10 MG/ML IV BOLUS
INTRAVENOUS | Status: DC | PRN
  Administered 2014-04-18: 200 mg via INTRAVENOUS

## 2014-04-18 MED ORDER — ROCURONIUM BROMIDE 100 MG/10ML IV SOLN
INTRAVENOUS | Status: DC | PRN
Start: 2014-04-18 — End: 2014-04-18
  Administered 2014-04-18: 25 mg via INTRAVENOUS

## 2014-04-18 MED ORDER — FENTANYL CITRATE 0.05 MG/ML IJ SOLN
25.0000 ug | INTRAMUSCULAR | Status: DC | PRN
  Administered 2014-04-18: 25 ug via INTRAVENOUS
  Filled 2014-04-18: qty 1

## 2014-04-18 MED ORDER — LIDOCAINE HCL (CARDIAC) 20 MG/ML IV SOLN
INTRAVENOUS | Status: DC | PRN
  Administered 2014-04-18: 100 mg via INTRAVENOUS

## 2014-04-18 MED ORDER — MIDAZOLAM HCL 5 MG/5ML IJ SOLN
INTRAMUSCULAR | Status: DC | PRN
  Administered 2014-04-18: 2 mg via INTRAVENOUS

## 2014-04-18 MED ORDER — GLYCOPYRROLATE 0.2 MG/ML IJ SOLN
INTRAMUSCULAR | Status: DC | PRN
  Administered 2014-04-18: 0.6 mg via INTRAVENOUS
  Administered 2014-04-18: 0.2 mg via INTRAVENOUS

## 2014-04-18 MED ORDER — SUCCINYLCHOLINE CHLORIDE 20 MG/ML IJ SOLN
INTRAMUSCULAR | Status: DC | PRN
  Administered 2014-04-18: 120 mg via INTRAVENOUS

## 2014-04-18 MED ORDER — ONDANSETRON HCL 4 MG/2ML IJ SOLN
INTRAMUSCULAR | Status: DC | PRN
  Administered 2014-04-18: 4 mg via INTRAVENOUS

## 2014-04-18 MED ORDER — NEOSTIGMINE METHYLSULFATE 10 MG/10ML IV SOLN
INTRAVENOUS | Status: DC | PRN
  Administered 2014-04-18: 5 mg via INTRAVENOUS

## 2014-04-18 MED ORDER — LACTATED RINGERS IR SOLN
Status: DC | PRN
  Administered 2014-04-18: 1

## 2014-04-18 MED ORDER — KETOROLAC TROMETHAMINE 30 MG/ML IJ SOLN
INTRAMUSCULAR | Status: DC | PRN
  Administered 2014-04-18: 30 mg via INTRAVENOUS

## 2014-04-18 MED ORDER — HYDROCODONE-IBUPROFEN 7.5-200 MG PO TABS: 1.0000 | ORAL_TABLET | Freq: Three times a day (TID) | ORAL | Status: DC | PRN

## 2014-04-18 MED ORDER — BUPIVACAINE HCL (PF) 0.25 % IJ SOLN
INTRAMUSCULAR | Status: DC | PRN
Start: 2014-04-18 — End: 2014-04-18
  Administered 2014-04-18: 10 mL

## 2014-04-18 MED ORDER — FENTANYL CITRATE 0.05 MG/ML IJ SOLN
INTRAMUSCULAR | Status: DC | PRN
  Administered 2014-04-18 (×4): 50 ug via INTRAVENOUS

## 2014-04-18 SURGICAL SUPPLY — 63 items
APPLICATOR COTTON TIP 6IN STRL (MISCELLANEOUS) IMPLANT
BAG URINE DRAINAGE (UROLOGICAL SUPPLIES) IMPLANT
BANDAGE ADHESIVE 1X3 (GAUZE/BANDAGES/DRESSINGS) IMPLANT
BLADE SURG 11 STRL SS (BLADE) ×3 IMPLANT
BLADE SURG ROTATE 9660 (MISCELLANEOUS) IMPLANT
CANISTER SUCTION 2500CC (MISCELLANEOUS) ×3 IMPLANT
CATH FOLEY 2WAY SLVR  5CC 16FR (CATHETERS) ×1
CATH FOLEY 2WAY SLVR 5CC 16FR (CATHETERS) ×2 IMPLANT
CATH ROBINSON RED A/P 16FR (CATHETERS) IMPLANT
CLOTH BEACON ORANGE TIMEOUT ST (SAFETY) ×3 IMPLANT
DERMABOND ADVANCED (GAUZE/BANDAGES/DRESSINGS)
DERMABOND ADVANCED .7 DNX12 (GAUZE/BANDAGES/DRESSINGS) IMPLANT
DRAPE CAMERA CLOSED 9X96 (DRAPES) ×3 IMPLANT
DRAPE UNDERBUTTOCKS STRL (DRAPE) ×3 IMPLANT
DRESSING TELFA 8X3 (GAUZE/BANDAGES/DRESSINGS) IMPLANT
ELECT REM PT RETURN 9FT ADLT (ELECTROSURGICAL) ×3
ELECTRODE REM PT RTRN 9FT ADLT (ELECTROSURGICAL) ×2 IMPLANT
FILTER SMOKE EVAC LAPAROSHD (FILTER) IMPLANT
GLOVE BIO SURGEON STRL SZ7 (GLOVE) ×6 IMPLANT
GLOVE BIOGEL M 6.5 STRL (GLOVE) ×3 IMPLANT
GLOVE INDICATOR 6.5 STRL GRN (GLOVE) ×3 IMPLANT
GOWN PREVENTION PLUS LG XLONG (DISPOSABLE) IMPLANT
GOWN STRL REUS W/TWL LRG LVL3 (GOWN DISPOSABLE) ×6 IMPLANT
LEGGING LITHOTOMY PAIR STRL (DRAPES) ×3 IMPLANT
NEEDLE HYPO 25X1 1.5 SAFETY (NEEDLE) ×3 IMPLANT
NEEDLE INSUFFLATION 14GA 120MM (NEEDLE) ×3 IMPLANT
NEEDLE INSUFFLATION 14GA 150MM (NEEDLE) IMPLANT
NS IRRIG 500ML POUR BTL (IV SOLUTION) ×3 IMPLANT
PACK BASIN DAY SURGERY FS (CUSTOM PROCEDURE TRAY) ×3 IMPLANT
PACK LAPAROSCOPY II (CUSTOM PROCEDURE TRAY) ×3 IMPLANT
PAD OB MATERNITY 4.3X12.25 (PERSONAL CARE ITEMS) ×3 IMPLANT
PAD PREP 24X48 CUFFED NSTRL (MISCELLANEOUS) ×3 IMPLANT
PENCIL BUTTON HOLSTER BLD 10FT (ELECTRODE) IMPLANT
POUCH SPECIMEN RETRIEVAL 10MM (ENDOMECHANICALS) ×3 IMPLANT
SCALPEL HARMONIC ACE (MISCELLANEOUS) IMPLANT
SCISSORS LAP 5X35 DISP (ENDOMECHANICALS) IMPLANT
SEALER TISSUE G2 CVD JAW 35 (ENDOMECHANICALS) IMPLANT
SEALER TISSUE G2 CVD JAW 45CM (ENDOMECHANICALS) ×3 IMPLANT
SET IRRIG TUBING LAPAROSCOPIC (IRRIGATION / IRRIGATOR) ×3 IMPLANT
SOLUTION ANTI FOG 6CC (MISCELLANEOUS) ×3 IMPLANT
SPONGE GAUZE 2X2 8PLY STRL LF (GAUZE/BANDAGES/DRESSINGS) ×3 IMPLANT
STRIP CLOSURE SKIN 1/4X4 (GAUZE/BANDAGES/DRESSINGS) IMPLANT
SUT MNCRL AB 3-0 PS2 18 (SUTURE) ×3 IMPLANT
SUT MON AB 2-0 CT1 36 (SUTURE) IMPLANT
SUT MON AB-0 CT1 36 (SUTURE) ×6 IMPLANT
SUT PLAIN 4 0 FS 2 27 (SUTURE) IMPLANT
SUT VIC AB 2-0 UR6 27 (SUTURE) ×3 IMPLANT
SUT VICRYL 0 TIES 12 18 (SUTURE) IMPLANT
SUT VICRYL 0 UR6 27IN ABS (SUTURE) IMPLANT
SUT VICRYL RAPIDE 3 0 (SUTURE) IMPLANT
SUT VICRYL RAPIDE 4/0 PS 2 (SUTURE) IMPLANT
SYR 3ML 23GX1 SAFETY (SYRINGE) IMPLANT
SYR CONTROL 10ML LL (SYRINGE) ×3 IMPLANT
SYRINGE 10CC LL (SYRINGE) ×3 IMPLANT
TAPE CLOTH SOFT 2X10 (GAUZE/BANDAGES/DRESSINGS) ×3 IMPLANT
TOWEL OR 17X24 6PK STRL BLUE (TOWEL DISPOSABLE) ×6 IMPLANT
TRAY DSU PREP LF (CUSTOM PROCEDURE TRAY) ×3 IMPLANT
TROCAR OPTI TIP 5M 100M (ENDOMECHANICALS) ×6 IMPLANT
TROCAR XCEL BLUNT TIP 100MML (ENDOMECHANICALS) IMPLANT
TROCAR XCEL DIL TIP R 11M (ENDOMECHANICALS) ×3 IMPLANT
TUBING INSUFFLATION 10FT LAP (TUBING) ×3 IMPLANT
VACUUM HOSE/TUBING 7/8INX6FT (MISCELLANEOUS) IMPLANT
WATER STERILE IRR 500ML POUR (IV SOLUTION) ×3 IMPLANT

## 2014-04-18 NOTE — Progress Notes (Signed)
Dr. Matthew Saras called and reported patient changed allergies, she is allergic to hydrocodone and not oxycodone. Allergies changed in epic, Dr. Matthew Saras will leave perscription at front desk in his office, husband to pick up.

## 2014-04-18 NOTE — Progress Notes (Signed)
The patient was re-examined with no change in status 

## 2014-04-18 NOTE — Transfer of Care (Signed)
Immediate Anesthesia Transfer of Care Note  Patient: [name] [name]  Procedure(s) Performed: Procedure(s) (LRB): LAPAROSCOPY DIAGNOSTIC (N/A) SALPINGO OOPHORECTOMY (Bilateral)  Patient Location: PACU  Anesthesia Type: General  Level of Consciousness: awake, alert  and oriented  Airway & Oxygen Therapy: Patient Spontanous Breathing and Patient connected to face mask oxygen  Post-op Assessment: Report given to PACU RN and Post -op Vital signs reviewed and stable  Post vital signs: Reviewed and stable  Complications: No apparent anesthesia complications

## 2014-04-18 NOTE — Discharge Instructions (Signed)
Bilateral Salpingo-Oophorectomy, Care After Refer to this sheet in the next few weeks. These instructions provide you with information on caring for yourself after your procedure. Your health care provider may also give you more specific instructions. Your treatment has been planned according to current medical practices, but problems sometimes occur. Call your health care provider if you have any problems or questions after your procedure. WHAT TO EXPECT AFTER THE PROCEDURE After your procedure, it is typical to have the following:   Abdominal pain that can be controlled with medicine.  Vaginal spotting.  Constipation.  Menopausal symptoms such as hot flashes, vaginal dryness, and mood swings. HOME CARE INSTRUCTIONS   Get plenty of rest and sleep.  Only take over-the-counter or prescription medicines as directed by your health care provider. Do not take aspirin. It can cause bleeding.  Keep incision areas clean and dry. Remove or change bandages (dressings) only as directed by your health care provider.  Take showers instead of baths for a few weeks as directed by your health care provider.  Limit exercise and activities as directed by your health care provider. Do not lift anything heavier than 5 pounds (2.3 kg) until your health care provider approves.  Do not drive until your health care provider approves.  Follow your health care provider's advice regarding diet. You may be able to resume your usual diet right away.  Drink enough fluids to keep your urine clear or pale yellow.  Do not douche, use tampons, or have sexual intercourse for 6 weeks after the procedure.  Do not drink alcohol until your health care provider says it is okay.  Take your temperature twice a day and write it down.  If you become constipated, you may:  Ask your health care provider about taking a mild laxative.  Add more fruit and bran to your diet.  Drink more fluids.  Follow up with your health  care provider as directed. SEEK MEDICAL CARE IF:   You have swelling, redness, or increasing pain in the incision area.  You see pus coming from the incision area.  You notice a bad smell coming from the wound or dressing.  You have pain, redness, or swelling where the IV access tube was placed.  Your incision is breaking open (the edges are not staying together).  You feel dizzy or feel like fainting.  You develop pain or bleeding when you urinate.  You develop diarrhea.  You develop nausea and vomiting.  You develop abnormal vaginal discharge.  You develop a rash.  You have pain that is not controlled with medicine. SEEK IMMEDIATE MEDICAL CARE IF:   You develop a fever.  You develop abdominal pain.  You have chest pain.  You develop shortness of breath.  You pass out.  You develop pain, swelling, or redness in your leg.  You develop heavy vaginal bleeding with or without blood clots. Document Released: 12/02/2005 Document Revised: 08/04/2013 Document Reviewed: 05/26/2013 [facility] Patient Information 2014 [place].   Post Anesthesia Home Care Instructions  Activity: Get plenty of rest for the remainder of the day. A responsible adult should stay with you for 24 hours following the procedure.  For the next 24 hours, DO NOT: -Drive a car -Paediatric nurse -Drink alcoholic beverages -Take any medication unless instructed by your physician -Make any legal decisions or sign important papers.  Meals: Start with liquid foods such as gelatin or soup. Progress to regular foods as tolerated. Avoid greasy, spicy, heavy foods. If nausea and/or  vomiting occur, drink only clear liquids until the nausea and/or vomiting subsides. Call your physician if vomiting continues.  Special Instructions/Symptoms: Your throat may feel dry or sore from the anesthesia or the breathing tube placed in your throat during surgery. If this causes discomfort, gargle with warm salt  water. The discomfort should disappear within 24 hours.

## 2014-04-18 NOTE — Op Note (Signed)
Patient ID: [name] [name], female   DOB: 1956/06/23, 59 y.o.   MRN: [id number]   Preoperative diagnosis: Chronic pelvic pain  Postoperative diagnosis: Same, adnexal adhesions  Procedure: Diagnostic laparoscopy with BSO  Surgeon: Matthew Saras  Anesthesia: Gen.  Drains: In and out Foley catheter  Specimens removed: Bilateral tubes and ovaries, to pathology  EBL: Less than 50 cc  Procedure and findings:  The patient taken the operating room after an adequate level of general anesthesia was obtained with the patient's legs in stirrups the abdomen perineum and vagina prepped and draped in usual fashion for laparoscopy. Foley catheter was positioned. Appropriate timeout taken at that point. Attention directed to the abdomen where the subumbilical area was infiltrated with quarter percent Marcaine plain, small incision was made in the varies needle was introduced without difficulty. Its intra-abdominal position was verified by pressure water testing. After 2-1/2 L pneumoperitoneum syncopated, lap scopic trocar and sleeve were then introduced that difficulty. There was no evidence of any bleeding or trauma. Patient was then placed in slight Trendelenburg, 3 finger breaths above the symphysis in the midline, 5 mm trocar was inserted under direct visualization. The pelvic findings as follows:  The uterus is surgically absent there were some filmy adhesions and possible early stage endometriosis on the surface of both ovaries. The pelvic cul-de-sac was otherwise free and clear, the upper abdomen including appendix appear to be normal.  With the right tube and ovary placed on traction toward the midline, the course of the right pelvic ureter was noted be well below. The Enseal device was then used to coagulate and divide the right IP ligament close to the ovary with excellent hemostasis. The exact same repeated on the left after carefully identifying the course of the left pelvic ureter well below. The lower  incision was enlarged slightly in both intact tubes and ovaries were removed and sent to pathology. Repeat laparoscopy revealed the operative site to be hemostatic. Its was removed, gas allowed to escape. The lower incision fascia closed with 2-0 Vicryl interrupted sutures. 4-0 Monocryl subcuticular closure on the skin at both sites. She tolerated this well went to recovery room in good condition.  Dictated with [facility]  [name] Suleiman M. Garry Heater.D.

## 2014-04-18 NOTE — Anesthesia Postprocedure Evaluation (Signed)
  Anesthesia Post-op Note  Patient: [name] [name]  Procedure(s) Performed: Procedure(s) (LRB): LAPAROSCOPY DIAGNOSTIC (N/A) SALPINGO OOPHORECTOMY (Bilateral)  Patient Location: PACU  Anesthesia Type: General  Level of Consciousness: awake and alert   Airway and Oxygen Therapy: Patient Spontanous Breathing  Post-op Pain: mild  Post-op Assessment: Post-op Vital signs reviewed, Patient's Cardiovascular Status Stable, Respiratory Function Stable, Patent Airway and No signs of Nausea or vomiting  Last Vitals:  Filed Vitals:   04/18/14 0830  BP: 138/81  Pulse: 89  Temp:   Resp: 10    Post-op Vital Signs: stable   Complications: No apparent anesthesia complications

## 2014-04-19 ENCOUNTER — Encounter (HOSPITAL_BASED_OUTPATIENT_CLINIC_OR_DEPARTMENT_OTHER): Payer: Self-pay | Admitting: Obstetrics and Gynecology

## 2014-07-28 ENCOUNTER — Other Ambulatory Visit: Payer: Self-pay | Admitting: Obstetrics and Gynecology

## 2014-07-29 LAB — CYTOLOGY - PAP

## 2014-11-03 ENCOUNTER — Encounter: Payer: Self-pay | Admitting: Family Medicine

## 2014-11-03 ENCOUNTER — Ambulatory Visit (INDEPENDENT_AMBULATORY_CARE_PROVIDER_SITE_OTHER): Payer: BC Managed Care – PPO | Admitting: Family Medicine

## 2014-11-03 VITALS — BP 132/70 | HR 71 | Temp 98.5°F | Wt 168.5 lb

## 2014-11-03 DIAGNOSIS — J0100 Acute maxillary sinusitis, unspecified: Secondary | ICD-10-CM

## 2014-11-03 MED ORDER — AMOXICILLIN-POT CLAVULANATE 875-125 MG PO TABS: 1.0000 | ORAL_TABLET | Freq: Two times a day (BID) | ORAL | Status: DC

## 2014-11-03 MED ORDER — FLUTICASONE PROPIONATE 50 MCG/ACT NA SUSP: 2.0000 | Freq: Every day | NASAL | Status: DC

## 2014-11-03 NOTE — Patient Instructions (Signed)
Use nasal saline and then flonase.  Start the antibiotics today and this should gradually get better.  Try to get some rest.  Drink plenty of fluids.   Glad to see you.

## 2014-11-03 NOTE — Progress Notes (Signed)
Pre visit review using our clinic review tool, if applicable. No additional management support is needed unless otherwise documented below in the visit note.  Sx started about 5 days ago, more HA in the meantime.  ST and swollen glands.  Slept sitting up.  Thick yellow nasal discharge.   Tried to go to work but had to leave early.  No fevers.  No vomiting, no diarrhea, no chills.  L>R ear pain. Facial pain, "like somebody hit me with a tire tool."  Some cough, mild.  Tooth pain noted, upper tooth pain.    Meds, vitals, and allergies reviewed.   ROS: See HPI.  Otherwise, noncontributory.  GEN: nad, alert and oriented HEENT: mucous membranes moist, tm w/o erythema, nasal exam w/o erythema, clear discharge noted,  OP with cobblestoning, sinuses ttp- maxillary>frontal NECK: supple w/o LA CV: rrr.   PULM: ctab, no inc wob EXT: no edema SKIN: no acute rash

## 2014-11-04 ENCOUNTER — Telehealth: Payer: Self-pay | Admitting: Family Medicine

## 2014-11-04 DIAGNOSIS — J0100 Acute maxillary sinusitis, unspecified: Secondary | ICD-10-CM | POA: Insufficient documentation

## 2014-11-04 NOTE — Assessment & Plan Note (Signed)
Use nasal saline and then flonase. Start augmentin, f/u prn.  Supportive care. Nontoxic.  She agrees.

## 2014-11-04 NOTE — Telephone Encounter (Signed)
emmi emailed °

## 2014-11-29 ENCOUNTER — Ambulatory Visit: Payer: BC Managed Care – PPO | Admitting: Internal Medicine

## 2014-12-16 LAB — HM MAMMOGRAPHY

## 2015-01-13 ENCOUNTER — Other Ambulatory Visit: Payer: Self-pay | Admitting: Obstetrics and Gynecology

## 2015-01-16 ENCOUNTER — Ambulatory Visit (INDEPENDENT_AMBULATORY_CARE_PROVIDER_SITE_OTHER): Payer: BLUE CROSS/BLUE SHIELD

## 2015-01-16 ENCOUNTER — Ambulatory Visit (INDEPENDENT_AMBULATORY_CARE_PROVIDER_SITE_OTHER): Payer: BLUE CROSS/BLUE SHIELD | Admitting: Podiatry

## 2015-01-16 ENCOUNTER — Encounter: Payer: Self-pay | Admitting: Podiatry

## 2015-01-16 VITALS — BP 147/86 | HR 70 | Resp 16 | Ht 68.0 in | Wt 169.0 lb

## 2015-01-16 DIAGNOSIS — M722 Plantar fascial fibromatosis: Secondary | ICD-10-CM

## 2015-01-16 MED ORDER — METHYLPREDNISOLONE (PAK) 4 MG PO TABS: ORAL_TABLET | ORAL | Status: DC

## 2015-01-16 MED ORDER — MELOXICAM 15 MG PO TABS: 15.0000 mg | ORAL_TABLET | Freq: Every day | ORAL | Status: DC

## 2015-01-16 NOTE — Patient Instructions (Signed)

## 2015-01-16 NOTE — Progress Notes (Signed)
   Subjective:    Patient ID: [name] [name], female    DOB: [birth year], 59 y.o.   MRN: [id number]  HPI Comments: Pain in both feet , I have heel spurs, i have plantar fasciitis. i have had a shot in my left foot, used vionixx shoes, stretch , ice . Its been going on for two years. Cant stand barefoot.  To step down on them hurts   Foot Pain Associated symptoms include fatigue.      Review of Systems  Constitutional: Positive for fatigue.  HENT:       Sinus problems   Eyes: Positive for redness.  Allergic/Immunologic: Positive for environmental allergies.  All other systems reviewed and are negative.      Objective:   Physical Exam: I have reviewed her past medical history medications allergies surgery social history and review of systems. Pulses are strongly palpable bilateral. Neurologic sensorium is intact percent once the monofilament. Deep tendon reflexes are intact bilaterally muscle strength is 5 over 5 dorsiflexion splint flexes it bothers her Tye Savoy musculature is intact. Orthopedic evaluation Mr. is all joints distal to the ankle range of motion without crepitation. Cutaneous evaluation demonstrates no erythema edema cellulitis drainage or odor. She has pain on palpation medial calcaneal tubercles bilateral. No pain on medial and lateral compression of the calcaneus. Radiographic evaluation does demonstrates small plantar distally oriented calcaneal heel spurs with a soft tissue increase in density at the plantar fascial calcaneal insertion site. Cutaneous evaluation does not demonstrate any type of erythema edema cellulitis drainage or odor.        Assessment & Plan:  Assessment: Plantar fasciitis bilateral.  Plan: Discussed the etiology pathology conservative versus surgical therapies. We discussed shoe gear modification stretching and ice exercises. I injected the bilateral heels. This was performed with Kenalog and local anesthetic. Plantar fascial strappings were  dispensed. Plantar fascial brace was dispensed. I wrote a prescription for Medrol Dosepak to be followed by meloxicam

## 2015-01-18 ENCOUNTER — Encounter: Payer: Self-pay | Admitting: Family Medicine

## 2015-01-18 ENCOUNTER — Ambulatory Visit (INDEPENDENT_AMBULATORY_CARE_PROVIDER_SITE_OTHER): Payer: BLUE CROSS/BLUE SHIELD | Admitting: Family Medicine

## 2015-01-18 VITALS — BP 128/78 | HR 65 | Temp 98.3°F | Ht 68.0 in | Wt 166.5 lb

## 2015-01-18 DIAGNOSIS — M797 Fibromyalgia: Secondary | ICD-10-CM

## 2015-01-18 DIAGNOSIS — Z01419 Encounter for gynecological examination (general) (routine) without abnormal findings: Secondary | ICD-10-CM

## 2015-01-18 DIAGNOSIS — Z0000 Encounter for general adult medical examination without abnormal findings: Secondary | ICD-10-CM

## 2015-01-18 DIAGNOSIS — Z720 Tobacco use: Secondary | ICD-10-CM

## 2015-01-18 LAB — COMPREHENSIVE METABOLIC PANEL WITH GFR
ALT: 17 U/L (ref 0–35)
AST: 12 U/L (ref 0–37)
Albumin: 4.5 g/dL (ref 3.5–5.2)
Alkaline Phosphatase: 83 U/L (ref 39–117)
BUN: 21 mg/dL (ref 6–23)
CO2: 26 meq/L (ref 19–32)
Calcium: 10 mg/dL (ref 8.4–10.5)
Chloride: 106 meq/L (ref 96–112)
Creatinine, Ser: 0.72 mg/dL (ref 0.40–1.20)
GFR: 88.72 mL/min
Glucose, Bld: 95 mg/dL (ref 70–99)
Potassium: 3.9 meq/L (ref 3.5–5.1)
Sodium: 137 meq/L (ref 135–145)
Total Bilirubin: 0.4 mg/dL (ref 0.2–1.2)
Total Protein: 6.9 g/dL (ref 6.0–8.3)

## 2015-01-18 LAB — CBC WITH DIFFERENTIAL/PLATELET
Basophils Absolute: 0 10*3/uL (ref 0.0–0.1)
Basophils Relative: 0.3 % (ref 0.0–3.0)
Eosinophils Absolute: 0 10*3/uL (ref 0.0–0.7)
Eosinophils Relative: 0.2 % (ref 0.0–5.0)
HCT: 42.5 % (ref 36.0–46.0)
Hemoglobin: 14.2 g/dL (ref 12.0–15.0)
Lymphocytes Relative: 13.1 % (ref 12.0–46.0)
Lymphs Abs: 1.7 10*3/uL (ref 0.7–4.0)
MCHC: 33.4 g/dL (ref 30.0–36.0)
MCV: 90.8 fl (ref 78.0–100.0)
Monocytes Absolute: 0.8 10*3/uL (ref 0.1–1.0)
Monocytes Relative: 6.1 % (ref 3.0–12.0)
Neutro Abs: 10.3 10*3/uL — ABNORMAL HIGH (ref 1.4–7.7)
Neutrophils Relative %: 80.3 % — ABNORMAL HIGH (ref 43.0–77.0)
Platelets: 314 10*3/uL (ref 150.0–400.0)
RBC: 4.68 Mil/uL (ref 3.87–5.11)
RDW: 13.3 % (ref 11.5–15.5)
WBC: 12.8 10*3/uL — ABNORMAL HIGH (ref 4.0–10.5)

## 2015-01-18 LAB — LIPID PANEL
Cholesterol: 219 mg/dL — ABNORMAL HIGH (ref 0–200)
HDL: 45 mg/dL (ref >39.00)
LDL Cholesterol: 146 mg/dL — ABNORMAL HIGH (ref 0–99)
NonHDL: 174
Total CHOL/HDL Ratio: 5
Triglycerides: 142 mg/dL (ref 0.0–149.0)
VLDL: 28.4 mg/dL (ref 0.0–40.0)

## 2015-01-18 LAB — MAGNESIUM: Magnesium: 2.1 mg/dL (ref 1.5–2.5)

## 2015-01-18 LAB — VITAMIN D 25 HYDROXY (VIT D DEFICIENCY, FRACTURES): VITD: 58.93 ng/mL (ref 30.00–100.00)

## 2015-01-18 LAB — TSH: TSH: 1.37 u[IU]/mL (ref 0.35–4.50)

## 2015-01-18 NOTE — Assessment & Plan Note (Signed)
Reviewed preventive care protocols, scheduled due services, and updated immunizations Discussed nutrition, exercise, diet, and healthy lifestyle.  Refuses vaccinations.  Due for labs. Orders Placed This Encounter  Procedures  . CBC with Differential/Platelet  . Comprehensive metabolic panel  . Lipid panel  . TSH  . Magnesium  . Vitamin D, 25-hydroxy

## 2015-01-18 NOTE — Assessment & Plan Note (Signed)
No longer taking Tramadol. Feels she is coping ok- still taking Mag and Calcium- will check these today.

## 2015-01-18 NOTE — Assessment & Plan Note (Signed)
Smoking cessation instruction/counseling given:  counseled patient on the dangers of tobacco use, advised patient to stop smoking, and reviewed strategies to maximize success 

## 2015-01-18 NOTE — Patient Instructions (Signed)
Good to see you. We will call you with your lab results.   

## 2015-01-18 NOTE — Progress Notes (Signed)
Pre visit review using our clinic review tool, if applicable. No additional management support is needed unless otherwise documented below in the visit note. 

## 2015-01-18 NOTE — Progress Notes (Signed)
Subjective:   Patient ID: [name] [name], female    DOB: 10/13/1956, 59 y.o.   MRN: [id number]  [name] [name] is a pleasant 59 y.o. year old female who presents to clinic today with Annual Exam  on 01/18/2015  HPI:  Has had a stressful year.   Seeing ortho and PT for knee injury- sees [facility] ortho.  Saw Dr. Milinda Pointer two days ago- received cortisone injections for plantar fascitis.  Colonoscopy 10/30/13[facility]le[facility] Purl Mammogram last week- sees Dr. Matthew Saras Refuses flu vaccines  Remote h/o hysterectomy, then had BSO in 04/2014. Not taking any HRT but she feels she coping ok- no hot flashes.  Tobacco abuse- not ready to quit.  Under stress, feels she cannot quit right now.  Fibromylagia- was followed by Dr. Estanislado Pandy. Has not seen her for a long time.  Does not like taking medications but she is taking some supplements that she suggested- Magnesium, calcium.  Current Outpatient Prescriptions on File Prior to Visit  Medication Sig Dispense Refill  . ALPRAZolam (XANAX) 0.25 MG tablet Take 0.25 mg by mouth at bedtime as needed.    . calcium carbonate (OS-CAL) 600 MG TABS Take 2 tablets by mouth 2 (two) times daily.     . Cholecalciferol (VITAMIN D3) 2000 UNITS TABS Take 1 capsule by mouth daily.    . Magnesium 250 MG TABS Take 1 tablet by mouth daily.    . methylPREDNIsolone (MEDROL DOSPACK) 4 MG tablet follow package directions 21 tablet 0  . vitamin E 400 UNIT capsule Take 400 Units by mouth daily.     No current facility-administered medications on file prior to visit.    Allergies  Allergen Reactions  . Hydrocodone Rash  . Demerol [Meperidine] Nausea And Vomiting    severe  . Sulfa Antibiotics Swelling and Rash    Past Medical History  Diagnosis Date  . Fibromyalgia   . Female pelvic pain   . Borderline hypertension   . Bone tumor (benign)     RIGHT FEMUR  . Arthritis     HANDS,  KNEES  . Left knee injury     TENDONDINITIS/  CHONDRAMALIA  . Anxiety   . PONV  (postoperative nausea and vomiting)     Past Surgical History  Procedure Laterality Date  . Laparoscopic assisted vaginal hysterectomy  01-25-2003  . Tubal ligation    . Nasal septum surgery  AGE 21  . Dilation and curettage of uterus  [place]  . Tonsillectomy  AGE 33  . Laparoscopy N/A 04/18/2014    Procedure: LAPAROSCOPY DIAGNOSTIC;  Surgeon: Margarette Asal, MD;  Location: [facility];  Service: Gynecology;  Laterality: N/A;  . Salpingoophorectomy Bilateral 04/18/2014    Procedure: SALPINGO OOPHORECTOMY;  Surgeon: Margarette Asal, MD;  Location: [facility];  Service: Gynecology;  Laterality: Bilateral;    Family History  Problem Relation Age of Onset  . Colon cancer Neg Hx   . Stomach cancer Neg Hx     History   Social History  . Marital Status: Married    Spouse Name: N/A    Number of Children: N/A  . Years of Education: N/A   Occupational History  . Not on file.   Social History Main Topics  . Smoking status: Current Every Day Smoker -- 0.50 packs/day for 30 years    Types: Cigarettes  . Smokeless tobacco: Never Used  . Alcohol Use: Yes  Comment: rare  . Drug Use: No  . Sexual Activity: Not on file   Other Topics Concern  . Not on file   Social History Narrative   The PMH, PSH, Social History, Family History, Medications, and allergies have been reviewed in [facility], and have been updated if relevant.  Review of Systems  Constitutional: Positive for fatigue. Negative for fever and unexpected weight change.  HENT: Negative.   Respiratory: Negative.   Cardiovascular: Negative.   Gastrointestinal: Negative.   Endocrine: Negative.   Genitourinary: Negative.   Musculoskeletal: Positive for myalgias and arthralgias.  Skin: Negative.   Allergic/Immunologic: Negative.   Neurological: Negative.   Hematological: Negative.   Psychiatric/Behavioral: Negative.        Objective:    BP 128/78 mmHg   Pulse 65  Temp(Src) 98.3 F (36.8 C) (Oral)  Ht 5\' 8"  (1.727 m)  Wt 166 lb 8 oz (75.524 kg)  BMI 25.32 kg/m2  SpO2 98%   Physical Exam   General:  Well-developed,well-nourished,in no acute distress; alert,appropriate and cooperative throughout examination Head:  normocephalic and atraumatic.   Eyes:  vision grossly intact, pupils equal, pupils round, and pupils reactive to light.   Ears:  R ear normal and L ear normal.   Nose:  no external deformity.   Mouth:  good dentition.   Neck:  No deformities, masses, or tenderness noted. Breasts:  Deferred- has GYN Lungs:  Normal respiratory effort, chest expands symmetrically. Lungs are clear to auscultation, no crackles or wheezes. Heart:  Normal rate and regular rhythm. S1 and S2 normal without gallop, murmur, click, rub or other extra sounds. Abdomen:  Bowel sounds positive,abdomen soft and non-tender without masses, organomegaly or hernias noted. Genitalia: deferred- has GYN Msk:  No deformity or scoliosis noted of thoracic or lumbar spine.   Extremities:  No clubbing, cyanosis, edema, or deformity noted with normal full range of motion of all joints.   Neurologic:  alert & oriented X3 and gait normal.   Skin:  Intact without suspicious lesions or rashes Cervical Nodes:  No lymphadenopathy noted Axillary Nodes:  No palpable lymphadenopathy Psych:  Cognition and judgment appear intact. Alert and cooperative with normal attention span and concentration. No apparent delusions, illusions, hallucinations       Assessment & Plan:   Well woman exam  Tobacco abuse  Fibromyalgia No Follow-up on file.

## 2015-01-19 LAB — CYTOLOGY - PAP

## 2015-01-23 ENCOUNTER — Encounter: Payer: Self-pay | Admitting: *Deleted

## 2015-01-25 ENCOUNTER — Telehealth: Payer: Self-pay | Admitting: Family Medicine

## 2015-01-25 NOTE — Telephone Encounter (Signed)
Lm on pts vm requesting a call back. Letter was mailed to pt indicating results

## 2015-01-25 NOTE — Telephone Encounter (Signed)
Pt called requesting call back with lab results.

## 2015-01-26 NOTE — Telephone Encounter (Signed)
Spoke to pt and informed her of results and instruction. Advised her i could send info regarding lowering cholesterol; mailed.

## 2015-01-26 NOTE — Telephone Encounter (Signed)
Pt returned your call. [phone]

## 2015-01-27 ENCOUNTER — Telehealth: Payer: Self-pay | Admitting: *Deleted

## 2015-01-27 NOTE — Telephone Encounter (Addendum)
Pt states is hurting in the right foot where she was injected 01/16/2015, started about 01/24/2015, what should she do?  Pt asked what type of foot she is and what type of shoe wear.  I spoke with pt and informed that the pain at the injection site was due to the injection being given into the inflamed area and the cortisone had work to the end of it's effectiveness.  I informed pt that often plantar fasciitis is a series of treatments or injections.  I encouraged pt to ice area 3 - 4 times a day for 10 - 15 minutes, protecting the skin with a thin towel or sock.  Pt states she completed the steroid pack and asked if she could take OTC Ibuprofen and I said if she could tolerate it, take it as the package directs.  Pt agreed.

## 2015-01-31 ENCOUNTER — Telehealth: Payer: Self-pay | Admitting: *Deleted

## 2015-01-31 NOTE — Telephone Encounter (Signed)
Pt states she can't buy shoes, because she doesn't know what kind of foot she has - pronate, or supinate.

## 2015-01-31 NOTE — Telephone Encounter (Signed)
She has a neutral foot type and should purchase a neurtral shoe.  She should go somewhere where there is an individual at the store that is experienced in evaluating foot type and gait.  ie [employer], shoe market, [employer] store in [place].

## 2015-01-31 NOTE — Telephone Encounter (Signed)
Dr. Milinda Pointer states [name] [name] has a neutral foot and should buy a neutral shoe, and be evaluated where salesperson knows gait and shoe type.

## 2015-02-13 ENCOUNTER — Ambulatory Visit: Payer: BLUE CROSS/BLUE SHIELD | Admitting: Podiatry

## 2015-02-14 ENCOUNTER — Encounter: Payer: Self-pay | Admitting: Podiatry

## 2015-02-20 ENCOUNTER — Ambulatory Visit (INDEPENDENT_AMBULATORY_CARE_PROVIDER_SITE_OTHER): Payer: BLUE CROSS/BLUE SHIELD | Admitting: Podiatry

## 2015-02-20 ENCOUNTER — Encounter: Payer: Self-pay | Admitting: Podiatry

## 2015-02-20 VITALS — BP 141/77 | HR 75 | Resp 16

## 2015-02-20 DIAGNOSIS — M722 Plantar fascial fibromatosis: Secondary | ICD-10-CM

## 2015-02-20 NOTE — Progress Notes (Signed)
She presents today for follow-up of her plantar fasciitis. She states that I think that the shot actually made my foot hurts worse. There she goes on to tell me that her feet are feeling much better. She still has pain but to a less degree. She states that she is no longer taking her medication and she is not wearing her night splint.  Objective: Vital signs are stable she is alert and oriented 3. Pulses are palpable bilateral. Positive pain on palpation medial calcaneal tubercles bilateral.  Assessment: Chronic intractable plantar fasciitis bilateral.  Plan: Encouraged her to get back on her meloxicam. Encouraged her to continue all other conservative therapies. We discussed the need for injection therapy today which she declined. We also discussed the need for physical therapy which she declined. We also discussed the need for orthotics which she was reluctant to her she did agree.  She was scanned for set of orthotics today. We'll follow-up with her in the near future which time another set of injections may need to be had.

## 2015-02-28 DIAGNOSIS — M25561 Pain in right knee: Secondary | ICD-10-CM | POA: Insufficient documentation

## 2015-03-15 ENCOUNTER — Telehealth: Payer: Self-pay | Admitting: Family Medicine

## 2015-03-15 DIAGNOSIS — E785 Hyperlipidemia, unspecified: Secondary | ICD-10-CM

## 2015-03-15 NOTE — Telephone Encounter (Signed)
Yes ok to check cholesterol. Lab ordered- please schedule appt.

## 2015-03-15 NOTE — Addendum Note (Signed)
Addended by: Lucille Passy on: 03/15/2015 02:21 PM   Modules accepted: Orders

## 2015-03-15 NOTE — Telephone Encounter (Signed)
Patient said her cholesterol was elevated when she was seen last.  Patient said she has lost a little weight and she's been watching her diet.  Patient wants to know if she can have her cholesterol checked again.  Patient said she had breakfast the last time she had it done and would like to come in fasting this time.

## 2015-03-17 ENCOUNTER — Ambulatory Visit (INDEPENDENT_AMBULATORY_CARE_PROVIDER_SITE_OTHER): Payer: BLUE CROSS/BLUE SHIELD | Admitting: Nurse Practitioner

## 2015-03-17 ENCOUNTER — Encounter: Payer: Self-pay | Admitting: Nurse Practitioner

## 2015-03-17 ENCOUNTER — Other Ambulatory Visit (INDEPENDENT_AMBULATORY_CARE_PROVIDER_SITE_OTHER): Payer: BLUE CROSS/BLUE SHIELD

## 2015-03-17 VITALS — BP 148/72 | HR 86 | Temp 98.0°F | Resp 16 | Ht 68.0 in | Wt 163.4 lb

## 2015-03-17 DIAGNOSIS — E785 Hyperlipidemia, unspecified: Secondary | ICD-10-CM

## 2015-03-17 DIAGNOSIS — I10 Essential (primary) hypertension: Secondary | ICD-10-CM

## 2015-03-17 LAB — LIPID PANEL
Cholesterol: 223 mg/dL — ABNORMAL HIGH (ref 0–200)
HDL: 44.8 mg/dL (ref >39.00)
LDL Cholesterol: 150 mg/dL — ABNORMAL HIGH (ref 0–99)
NonHDL: 178.2
Total CHOL/HDL Ratio: 5
Triglycerides: 142 mg/dL (ref 0.0–149.0)
VLDL: 28.4 mg/dL (ref 0.0–40.0)

## 2015-03-17 MED ORDER — HYDROCHLOROTHIAZIDE 12.5 MG PO TABS: 12.5000 mg | ORAL_TABLET | Freq: Every day | ORAL | Status: DC

## 2015-03-17 NOTE — Progress Notes (Signed)
Pre visit review using our clinic review tool, if applicable. No additional management support is needed unless otherwise documented below in the visit note. 

## 2015-03-17 NOTE — Patient Instructions (Addendum)
Please take 1 daily in the morning.   potassium foods include beans, dark leafy greens, potatoes, squash, yogurt, fish, avocados, mushrooms, and bananas.   Follow up with your provider when possible.

## 2015-03-17 NOTE — Progress Notes (Signed)
Subjective:    Patient ID: [name] [name], female    DOB: [birth year], 59 y.o.   MRN: [id number]  HPI  [name] [name] is a 59 yo female with a CC of BP issues.   1) Patient reports she is stressing more lately, workman's comp case about knee  Seeing foot doctor for plantar faciitis  165/90 at ortho doctor  148/72 today Pt not taking anything for BP and is taking Xanax for stress/anxiety.   Review of Systems  Constitutional: Negative for fever, chills, diaphoresis and fatigue.  Respiratory: Negative for chest tightness, shortness of breath and wheezing.   Cardiovascular: Negative for chest pain, palpitations and leg swelling.  Gastrointestinal: Negative for nausea, vomiting and diarrhea.  Musculoskeletal: Positive for myalgias and arthralgias.  Skin: Negative for rash.  Neurological: Negative for dizziness, weakness, numbness and headaches.  Psychiatric/Behavioral: Positive for sleep disturbance. The patient is nervous/anxious.    Past Medical History  Diagnosis Date  . Fibromyalgia   . Female pelvic pain   . Borderline hypertension   . Bone tumor (benign)     RIGHT FEMUR  . Arthritis     HANDS,  KNEES  . Left knee injury     TENDONDINITIS/  CHONDRAMALIA  . Anxiety   . PONV (postoperative nausea and vomiting)     History   Social History  . Marital Status: Married    Spouse Name: N/A  . Number of Children: N/A  . Years of Education: N/A   Occupational History  . Not on file.   Social History Main Topics  . Smoking status: Current Every Day Smoker -- 0.50 packs/day for 30 years    Types: Cigarettes  . Smokeless tobacco: Never Used  . Alcohol Use: Yes     Comment: rare  . Drug Use: No  . Sexual Activity: Not on file   Other Topics Concern  . Not on file   Social History Narrative    Past Surgical History  Procedure Laterality Date  . Laparoscopic assisted vaginal hysterectomy  01-25-2003  . Tubal ligation    . Nasal septum surgery  AGE 55  . Dilation  and curettage of uterus  [place]  . Tonsillectomy  AGE 70  . Laparoscopy N/A 04/18/2014    Procedure: LAPAROSCOPY DIAGNOSTIC;  Surgeon: Margarette Asal, MD;  Location: [facility];  Service: Gynecology;  Laterality: N/A;  . Salpingoophorectomy Bilateral 04/18/2014    Procedure: SALPINGO OOPHORECTOMY;  Surgeon: Margarette Asal, MD;  Location: [facility];  Service: Gynecology;  Laterality: Bilateral;    Family History  Problem Relation Age of Onset  . Colon cancer Neg Hx   . Stomach cancer Neg Hx     Allergies  Allergen Reactions  . Hydrocodone Rash  . Demerol [Meperidine] Nausea And Vomiting    severe  . Sulfa Antibiotics Swelling and Rash    Current Outpatient Prescriptions on File Prior to Visit  Medication Sig Dispense Refill  . ALPRAZolam (XANAX) 0.25 MG tablet Take 0.25 mg by mouth at bedtime as needed.    . calcium carbonate (OS-CAL) 600 MG TABS Take 1 tablet by mouth 2 (two) times daily.    . Cholecalciferol (VITAMIN D3) 2000 UNITS TABS Take 1 capsule by mouth daily.    . Magnesium 250 MG TABS Take 1 tablet by mouth daily.    . vitamin E 400 UNIT capsule Take 400 Units by mouth daily.  No current facility-administered medications on file prior to visit.      Objective:   Physical Exam  Constitutional: She is oriented to person, place, and time. She appears well-developed and well-nourished. No distress.  BP 148/72 mmHg  Pulse 86  Temp(Src) 98 F (36.7 C) (Oral)  Resp 16  Ht 5\' 8"  (1.727 m)  Wt 163 lb 6.4 oz (74.118 kg)  BMI 24.85 kg/m2  SpO2 97%   HENT:  Head: Normocephalic and atraumatic.  Right Ear: External ear normal.  Left Ear: External ear normal.  Cardiovascular: Normal rate, regular rhythm and normal heart sounds.  Exam reveals no gallop and no friction rub.   No murmur heard. Pulmonary/Chest: Effort normal and breath sounds normal. No respiratory distress. She has no wheezes. She has no  rales. She exhibits no tenderness.  Neurological: She is alert and oriented to person, place, and time. No cranial nerve deficit. She exhibits normal muscle tone. Coordination normal.  Skin: Skin is warm and dry. No rash noted. She is not diaphoretic.  Psychiatric: She has a normal mood and affect. Her behavior is normal. Judgment and thought content normal.      Assessment & Plan:

## 2015-03-20 ENCOUNTER — Ambulatory Visit (INDEPENDENT_AMBULATORY_CARE_PROVIDER_SITE_OTHER): Payer: BLUE CROSS/BLUE SHIELD | Admitting: Podiatry

## 2015-03-20 DIAGNOSIS — M722 Plantar fascial fibromatosis: Secondary | ICD-10-CM | POA: Diagnosis not present

## 2015-03-20 NOTE — Progress Notes (Signed)
Orthotics dispensed. Given breakin instructions.Will recheck in 1 mo., if needed  Objective: Ulcer strongly palpable no calf pain. She has pain on palpation medial calcaneal tubercle of her left heel and medial longitudinal arch.  Assessment: Chronic intractable plantar fascitis left foot.  Plan: Injected her left heel today with Kenalog and local anesthetic. I will follow-up with her in 1 month and she will start wearing her orthotics on a regular basis.

## 2015-03-20 NOTE — Patient Instructions (Signed)

## 2015-03-24 DIAGNOSIS — I10 Essential (primary) hypertension: Secondary | ICD-10-CM

## 2015-03-24 HISTORY — DX: Essential (primary) hypertension: I10

## 2015-03-24 NOTE — Assessment & Plan Note (Signed)
BP Readings from Last 3 Encounters:  03/17/15 148/72  02/20/15 141/77  01/18/15 128/78   BP at Ortho visit was 165/90 Asked pt to try HCTZ 12.5 daily in the morning. Pt has low end of normal range for potassium, instructed on potassium rich foods. Will need to follow up with PCP in 1 month. Work on stress with meditation, diary, or talking with a support person in her life.

## 2015-03-29 ENCOUNTER — Encounter: Payer: Self-pay | Admitting: *Deleted

## 2015-03-29 MED ORDER — SIMVASTATIN 10 MG PO TABS: 10.0000 mg | ORAL_TABLET | Freq: Every day | ORAL | Status: DC

## 2015-03-29 NOTE — Addendum Note (Signed)
Addended by: Modena Nunnery on: 03/29/2015 08:22 AM   Modules accepted: Orders

## 2015-04-07 ENCOUNTER — Telehealth: Payer: Self-pay

## 2015-04-07 MED ORDER — LOVASTATIN 20 MG PO TABS: 20.0000 mg | ORAL_TABLET | Freq: Every day | ORAL | Status: DC

## 2015-04-07 NOTE — Telephone Encounter (Signed)
Okay to change to the lovastatin 20 (1 year Rx)  See if Dr Deborra Medina had set up repeat lab or if she has follow up appt scheduled

## 2015-04-07 NOTE — Telephone Encounter (Signed)
Lovastatin 10 & 20 mg is on the $4 list

## 2015-04-07 NOTE — Telephone Encounter (Signed)
See if [facility] has any statin on $4 list

## 2015-04-07 NOTE — Telephone Encounter (Signed)
Pt has not started simvastatin yet; pt called [facility] [address] and was advised cost monthly will be $19.00; pt understood would cost $4.00/ month. Pt has lost her job and ins and pt wants to know if there is something less expensive could be substituted. Pt request cb today.

## 2015-04-07 NOTE — Telephone Encounter (Signed)
Spoke with patient and advised results rx sent to pharmacy by e-script  

## 2015-06-30 ENCOUNTER — Telehealth: Payer: Self-pay | Admitting: Family Medicine

## 2015-06-30 NOTE — Telephone Encounter (Signed)
Lm on pts vm and advised per RBaity 

## 2015-06-30 NOTE — Telephone Encounter (Signed)
Pt has appt on 07/03/15 with Dr Deborra Medina. Sending note to Dr Deborra Medina and Webb Silversmith NP.

## 2015-06-30 NOTE — Telephone Encounter (Signed)
If she has been having symptoms this long and they are not any worse and no difficulty breathing, OK to wait to appt on Monday. UC or ER over the weekend if symptoms worsen

## 2015-06-30 NOTE — Telephone Encounter (Signed)
Patient Name: [name] [name]  DOB: 20-Aug-1956    Initial Comment Caller states was put on low dose BP meds on April 1st. having facial swelling and lips feel strange (started noticing a month ago). Heart feels weird. feels like it flutters (wants to know if she should continue BP)    Nurse Assessment  Nurse: Mallie Mussel, RN, Alveta Heimlich Date/Time (Eastern Time): 06/30/2015 2:41:24 PM  Confirm and document reason for call. If symptomatic, describe symptoms. ---Caller states that she was put on HCTZ in April. She has had some facial swelling and lip swelling which began about a month ago. Her face is a little puffy at present. She has had some heart fluttering for the past 2.5-3 months. She denies that at this time.  Has the patient traveled out of the country within the last 30 days? ---No  Does the patient require triage? ---Yes  Related visit to physician within the last 2 weeks? ---No  Does the PT have any chronic conditions? (i.e. diabetes, asthma, etc.) ---Yes  List chronic conditions. ---HTN, Fibromyalgia     Guidelines    Guideline Title Affirmed Question Affirmed Notes  Face Swelling [1] Mild facial swelling (puffiness) AND [2] persists > 3 days    Final Disposition User   See PCP When Office is Open (within 3 days) Mallie Mussel, RN, Alveta Heimlich    Disagree/Comply: Leta Baptist

## 2015-07-03 ENCOUNTER — Ambulatory Visit: Payer: Self-pay | Admitting: Family Medicine

## 2015-07-04 ENCOUNTER — Encounter: Payer: Self-pay | Admitting: Family Medicine

## 2015-07-04 ENCOUNTER — Ambulatory Visit (INDEPENDENT_AMBULATORY_CARE_PROVIDER_SITE_OTHER): Payer: Self-pay | Admitting: Family Medicine

## 2015-07-04 VITALS — BP 128/82 | HR 63 | Temp 98.3°F | Wt 162.5 lb

## 2015-07-04 DIAGNOSIS — J3089 Other allergic rhinitis: Secondary | ICD-10-CM

## 2015-07-04 DIAGNOSIS — J309 Allergic rhinitis, unspecified: Secondary | ICD-10-CM | POA: Insufficient documentation

## 2015-07-04 DIAGNOSIS — F4323 Adjustment disorder with mixed anxiety and depressed mood: Secondary | ICD-10-CM | POA: Insufficient documentation

## 2015-07-04 DIAGNOSIS — I10 Essential (primary) hypertension: Secondary | ICD-10-CM

## 2015-07-04 DIAGNOSIS — T50905A Adverse effect of unspecified drugs, medicaments and biological substances, initial encounter: Secondary | ICD-10-CM | POA: Insufficient documentation

## 2015-07-04 DIAGNOSIS — T887XXA Unspecified adverse effect of drug or medicament, initial encounter: Secondary | ICD-10-CM

## 2015-07-04 HISTORY — DX: Allergic rhinitis, unspecified: J30.9

## 2015-07-04 HISTORY — DX: Adjustment disorder with mixed anxiety and depressed mood: F43.23

## 2015-07-04 MED ORDER — ALPRAZOLAM 0.25 MG PO TABS: 0.2500 mg | ORAL_TABLET | Freq: Every evening | ORAL | Status: DC | PRN

## 2015-07-04 NOTE — Assessment & Plan Note (Signed)
New- resolved but unclear if this was a drug reaction or due to anxiety.  Normotensive without HCTZ. Will d/c HCTZ and follow up BP check in 2 weeks. The patient indicates understanding of these issues and agrees with the plan.

## 2015-07-04 NOTE — Progress Notes (Signed)
Subjective:   Patient ID: [name] [name], female    DOB: 1956/03/05, 59 y.o.   MRN: [id number]  [name] [name] is a pleasant 59 y.o. year old female who presents to clinic today with Facial Swelling  on 07/04/2015  HPI:  ? Allergic to HCTZ.  Started HCTZ on 03/17/15 Lorane Gell, NP).  Has noticed some facial and lip swelling intermittently for about a month along with intermittent palpitations for past 3 months.  Symptoms have mostly resolved since she stopped taking HCTZ a few days ago.  She is under a lot of anxiety lately.  Recently lost her job- end of March- a few days before she was started on HCTZ.  Had some palpitations intermittently since she lost her job.  Prn xanax does seem to help but she tries not to take it often.  Cleaning out her mom's old house.  Allergic to mold and now her face feels more swollen.  Not taking anything for it.  BP Readings from Last 3 Encounters:  07/04/15 128/82  03/17/15 148/72  02/20/15 141/77     Lab Results  Component Value Date   TSH 1.37 01/18/2015   Lab Results  Component Value Date   CREATININE 0.72 01/18/2015   Lab Results  Component Value Date   WBC 12.8* 01/18/2015   HGB 14.2 01/18/2015   HCT 42.5 01/18/2015   MCV 90.8 01/18/2015   PLT 314.0 01/18/2015   Lab Results  Component Value Date   NA 137 01/18/2015   K 3.9 01/18/2015   CL 106 01/18/2015   CO2 26 01/18/2015    Current Outpatient Prescriptions on File Prior to Visit  Medication Sig Dispense Refill  . ALPRAZolam (XANAX) 0.25 MG tablet Take 0.25 mg by mouth at bedtime as needed.    . calcium carbonate (OS-CAL) 600 MG TABS Take 1 tablet by mouth 2 (two) times daily.    . Cholecalciferol (VITAMIN D3) 2000 UNITS TABS Take 1 capsule by mouth daily.    . hydrochlorothiazide (HYDRODIURIL) 12.5 MG tablet Take 1 tablet (12.5 mg total) by mouth daily. 90 tablet 3  . Magnesium 250 MG TABS Take 1 tablet by mouth daily.    . vitamin E 400 UNIT capsule Take 400 Units by  mouth daily.    Marland Kitchen lovastatin (MEVACOR) 20 MG tablet Take 1 tablet (20 mg total) by mouth at bedtime. (Patient not taking: Reported on 07/04/2015) 30 tablet 11   No current facility-administered medications on file prior to visit.    Allergies  Allergen Reactions  . Hydrocodone Rash  . Demerol [Meperidine] Nausea And Vomiting    severe  . Sulfa Antibiotics Swelling and Rash    Past Medical History  Diagnosis Date  . Fibromyalgia   . Female pelvic pain   . Borderline hypertension   . Bone tumor (benign)     RIGHT FEMUR  . Arthritis     HANDS,  KNEES  . Left knee injury     TENDONDINITIS/  CHONDRAMALIA  . Anxiety   . PONV (postoperative nausea and vomiting)     Past Surgical History  Procedure Laterality Date  . Laparoscopic assisted vaginal hysterectomy  01-25-2003  . Tubal ligation    . Nasal septum surgery  AGE 59  . Dilation and curettage of uterus  [place]  . Tonsillectomy  AGE 59  . Laparoscopy N/A 04/18/2014    Procedure: LAPAROSCOPY DIAGNOSTIC;  Surgeon: Margarette Asal, MD;  Location: [place];  Service: Gynecology;  Laterality: N/A;  . Salpingoophorectomy Bilateral 04/18/2014    Procedure: SALPINGO OOPHORECTOMY;  Surgeon: Margarette Asal, MD;  Location: [facility];  Service: Gynecology;  Laterality: Bilateral;    Family History  Problem Relation Age of Onset  . Colon cancer Neg Hx   . Stomach cancer Neg Hx     History   Social History  . Marital Status: Married    Spouse Name: N/A  . Number of Children: N/A  . Years of Education: N/A   Occupational History  . Not on file.   Social History Main Topics  . Smoking status: Current Every Day Smoker -- 0.50 packs/day for 30 years    Types: Cigarettes  . Smokeless tobacco: Never Used  . Alcohol Use: Yes     Comment: rare  . Drug Use: No  . Sexual Activity: Not on file   Other Topics Concern  . Not on file   Social History Narrative    The PMH, PSH, Social History, Family History, Medications, and allergies have been reviewed in [facility], and have been updated if relevant.  Review of Systems  Constitutional: Negative.   HENT: Positive for sinus pressure. Negative for ear discharge, ear pain, facial swelling, hearing loss, mouth sores, nosebleeds, postnasal drip, rhinorrhea, sneezing, sore throat, tinnitus, trouble swallowing and voice change.   Respiratory: Negative.   Cardiovascular: Positive for palpitations. Negative for chest pain and leg swelling.  Musculoskeletal: Negative.   Skin: Negative.   Neurological: Negative.   Hematological: Negative.   Psychiatric/Behavioral: Positive for sleep disturbance. Negative for suicidal ideas, hallucinations, behavioral problems, confusion, self-injury, dysphoric mood, decreased concentration and agitation. The patient is nervous/anxious and is hyperactive.   All other systems reviewed and are negative.      Objective:    BP 128/82 mmHg  Pulse 63  Temp(Src) 98.3 F (36.8 C) (Oral)  Wt 162 lb 8 oz (73.71 kg)  SpO2 98%  Wt Readings from Last 3 Encounters:  07/04/15 162 lb 8 oz (73.71 kg)  03/17/15 163 lb 6.4 oz (74.118 kg)  01/18/15 166 lb 8 oz (75.524 kg)    Physical Exam  Constitutional: She is oriented to person, place, and time. She appears well-developed and well-nourished. No distress.  HENT:  Head: Normocephalic.  Right Ear: Hearing and tympanic membrane normal.  Left Ear: Hearing and tympanic membrane normal.  Nose: Mucosal edema present. No rhinorrhea. Right sinus exhibits no maxillary sinus tenderness and no frontal sinus tenderness. Left sinus exhibits no maxillary sinus tenderness and no frontal sinus tenderness.  Eyes: Conjunctivae are normal.  Cardiovascular: Normal rate, regular rhythm and normal heart sounds.   Pulmonary/Chest: Effort normal and breath sounds normal.  Musculoskeletal: Normal range of motion. She exhibits no edema.  Neurological: She is  alert and oriented to person, place, and time. No cranial nerve deficit.  Skin: Skin is warm and dry.  Psychiatric: She has a normal mood and affect. Her behavior is normal. Judgment normal.  anxious  Nursing note and vitals reviewed.         Assessment & Plan:   Essential hypertension  Adverse drug reaction, initial encounter No Follow-up on file.

## 2015-07-04 NOTE — Assessment & Plan Note (Signed)
Deteriorated. Advised flonase or other nasal steroid with as needed oral antihistamine.

## 2015-07-04 NOTE — Progress Notes (Signed)
Pre visit review using our clinic review tool, if applicable. No additional management support is needed unless otherwise documented below in the visit note. 

## 2015-07-04 NOTE — Assessment & Plan Note (Signed)
Normotensive without HCTZ. Will d/c HCTZ and follow up BP check in 2 weeks. The patient indicates understanding of these issues and agrees with the plan.

## 2015-07-04 NOTE — Assessment & Plan Note (Signed)
>  25 minutes spent in face to face time with patient, >50% spent in counselling or coordination of care discussing anxiety, palpitations, facial swelling, lip tingling. She is currently applying for a new job which will hopefully help with her anxiety symptoms.  Continue prn xanax for panic attacks- she is aware of addiction and sedation potential. Rx refilled and given to pt today at [place]. Call or return to clinic prn if these symptoms worsen or fail to improve as anticipated. The patient indicates understanding of these issues and agrees with the plan.

## 2015-07-04 NOTE — Patient Instructions (Addendum)
Good to see you. Let's stop taking the HCTZ.  If you can come see me in 2 weeks, please do.  If not, then check it at the pharmacy and call me if above 145/90 consistently.  Try flonase over the counter.

## 2015-07-13 ENCOUNTER — Encounter: Payer: Self-pay | Admitting: *Deleted

## 2015-07-21 ENCOUNTER — Encounter: Payer: Self-pay | Admitting: *Deleted

## 2015-08-08 ENCOUNTER — Encounter: Payer: Self-pay | Admitting: Family Medicine

## 2015-11-01 ENCOUNTER — Telehealth: Payer: Self-pay

## 2015-11-01 NOTE — Telephone Encounter (Signed)
Please put her on the schedule to see one of my partners this week.

## 2015-11-01 NOTE — Telephone Encounter (Signed)
Pt left note; pt was to report BP readings higher than [id number] for systolic; today BP reading earlier at [facility] was 169/87; 2nd reading 141/78 and 3rd BP reading was 149/78.  Pt request cb today. [facility]. Pt last seen 07/04/15 and no future appt scheduled.

## 2015-11-01 NOTE — Telephone Encounter (Signed)
Lm on pts vm and advised per Dr Deborra Medina. Pt advised to contact office and schedule OV; informed Dr Deborra Medina out of office remainder of week

## 2016-02-22 ENCOUNTER — Other Ambulatory Visit: Payer: Self-pay | Admitting: Family Medicine

## 2016-02-22 DIAGNOSIS — Z01419 Encounter for gynecological examination (general) (routine) without abnormal findings: Secondary | ICD-10-CM | POA: Insufficient documentation

## 2016-02-22 DIAGNOSIS — E785 Hyperlipidemia, unspecified: Secondary | ICD-10-CM

## 2016-02-22 DIAGNOSIS — I10 Essential (primary) hypertension: Secondary | ICD-10-CM

## 2016-02-23 ENCOUNTER — Other Ambulatory Visit (INDEPENDENT_AMBULATORY_CARE_PROVIDER_SITE_OTHER): Payer: BLUE CROSS/BLUE SHIELD

## 2016-02-23 ENCOUNTER — Telehealth: Payer: Self-pay | Admitting: *Deleted

## 2016-02-23 DIAGNOSIS — Z0000 Encounter for general adult medical examination without abnormal findings: Secondary | ICD-10-CM

## 2016-02-23 DIAGNOSIS — E785 Hyperlipidemia, unspecified: Secondary | ICD-10-CM | POA: Diagnosis not present

## 2016-02-23 DIAGNOSIS — Z01419 Encounter for gynecological examination (general) (routine) without abnormal findings: Secondary | ICD-10-CM

## 2016-02-23 LAB — COMPREHENSIVE METABOLIC PANEL
ALT: 18 U/L (ref 0–35)
AST: 16 U/L (ref 0–37)
Albumin: 4.5 g/dL (ref 3.5–5.2)
Alkaline Phosphatase: 71 U/L (ref 39–117)
BUN: 15 mg/dL (ref 6–23)
CO2: 30 mEq/L (ref 19–32)
Calcium: 10 mg/dL (ref 8.4–10.5)
Chloride: 105 mEq/L (ref 96–112)
Creatinine, Ser: 0.82 mg/dL (ref 0.40–1.20)
GFR: 76.06 mL/min (ref >60.00)
Glucose, Bld: 108 mg/dL — ABNORMAL HIGH (ref 70–99)
Potassium: 4.2 mEq/L (ref 3.5–5.1)
Sodium: 142 mEq/L (ref 135–145)
Total Bilirubin: 0.6 mg/dL (ref 0.2–1.2)
Total Protein: 6.9 g/dL (ref 6.0–8.3)

## 2016-02-23 LAB — CBC WITH DIFFERENTIAL/PLATELET
Basophils Absolute: 0 10*3/uL (ref 0.0–0.1)
Basophils Relative: 0.6 % (ref 0.0–3.0)
Eosinophils Absolute: 0.5 10*3/uL (ref 0.0–0.7)
Eosinophils Relative: 6.5 % — ABNORMAL HIGH (ref 0.0–5.0)
HCT: 42.3 % (ref 36.0–46.0)
Hemoglobin: 14.2 g/dL (ref 12.0–15.0)
Lymphocytes Relative: 19.8 % (ref 12.0–46.0)
Lymphs Abs: 1.5 10*3/uL (ref 0.7–4.0)
MCHC: 33.5 g/dL (ref 30.0–36.0)
MCV: 90.8 fl (ref 78.0–100.0)
Monocytes Absolute: 0.4 10*3/uL (ref 0.1–1.0)
Monocytes Relative: 5.4 % (ref 3.0–12.0)
Neutro Abs: 5.2 10*3/uL (ref 1.4–7.7)
Neutrophils Relative %: 67.7 % (ref 43.0–77.0)
Platelets: 281 10*3/uL (ref 150.0–400.0)
RBC: 4.66 Mil/uL (ref 3.87–5.11)
RDW: 13.4 % (ref 11.5–15.5)
WBC: 7.6 10*3/uL (ref 4.0–10.5)

## 2016-02-23 LAB — LIPID PANEL
Cholesterol: 219 mg/dL — ABNORMAL HIGH (ref 0–200)
HDL: 37.6 mg/dL — ABNORMAL LOW (ref >39.00)
LDL Cholesterol: 149 mg/dL — ABNORMAL HIGH (ref 0–99)
NonHDL: 181.48
Total CHOL/HDL Ratio: 6
Triglycerides: 164 mg/dL — ABNORMAL HIGH (ref 0.0–149.0)
VLDL: 32.8 mg/dL (ref 0.0–40.0)

## 2016-02-23 LAB — TSH: TSH: 1.74 u[IU]/mL (ref 0.35–4.50)

## 2016-02-23 NOTE — Telephone Encounter (Signed)
FYI pt restarted HCTZ 12.5mg  on Dec 23 because bp was elevated when checking it at pharmacies ([facility]). She has been having intermittent dull headaches, she is under more stress lately, lost job last year and has been in school. Also had lots of family visiting for holidays. She has cpx next week and will discuss with you then, just wanted to make you aware.

## 2016-02-24 LAB — HEPATITIS C ANTIBODY: HCV Ab: NEGATIVE

## 2016-02-28 ENCOUNTER — Encounter: Payer: Self-pay | Admitting: Family Medicine

## 2016-03-06 ENCOUNTER — Encounter: Payer: Self-pay | Admitting: Family Medicine

## 2016-03-06 ENCOUNTER — Ambulatory Visit (INDEPENDENT_AMBULATORY_CARE_PROVIDER_SITE_OTHER): Payer: BLUE CROSS/BLUE SHIELD | Admitting: Family Medicine

## 2016-03-06 VITALS — BP 132/72 | HR 74 | Temp 98.0°F | Ht 68.0 in | Wt 163.2 lb

## 2016-03-06 DIAGNOSIS — M797 Fibromyalgia: Secondary | ICD-10-CM

## 2016-03-06 DIAGNOSIS — Z0000 Encounter for general adult medical examination without abnormal findings: Secondary | ICD-10-CM | POA: Diagnosis not present

## 2016-03-06 DIAGNOSIS — Z01419 Encounter for gynecological examination (general) (routine) without abnormal findings: Secondary | ICD-10-CM

## 2016-03-06 DIAGNOSIS — I10 Essential (primary) hypertension: Secondary | ICD-10-CM | POA: Diagnosis not present

## 2016-03-06 DIAGNOSIS — E785 Hyperlipidemia, unspecified: Secondary | ICD-10-CM

## 2016-03-06 MED ORDER — ALPRAZOLAM 0.25 MG PO TABS: 0.2500 mg | ORAL_TABLET | Freq: Every evening | ORAL | Status: DC | PRN

## 2016-03-06 NOTE — Progress Notes (Signed)
Subjective:   Patient ID: [name] [name], female    DOB: 04/04/1956, 60 y.o.   MRN: [id number]  [name] [name] is a pleasant 60 y.o. year old female who presents to clinic today with Annual Exam and follow up of chronic medical conditions on 03/06/2016  HPI:   Colonoscopy 10/14/12- [facility] - sees Dr. Matthew Saras.  Has appt scheduled for 4/27 Refuses flu vaccines  Remote h/o hysterectomy, then had BSO in 04/2014. Not taking any HRT but she feels she coping ok- no hot flashes.  Tobacco abuse- not ready to quit.  Under stress, feels she cannot quit right now.  Fibromylagia- was followed by Dr. Estanislado Pandy. Has not seen her for a long time.  Does not like taking medications but she is taking some supplements that she suggested- Magnesium, calcium.  HTN- currently taking HCTZ 12.5 mg daily. Has had some intermittent elevated readings. Denies HA, blurred vision, CP or SOB.  HLD- was prescirbed Mevacor 20 mg daily but never started it. She was afraid it would worsen her myalgias.  Lab Results  Component Value Date   CHOL 219* 02/23/2016   HDL 37.60* 02/23/2016   LDLCALC 149* 02/23/2016   TRIG 164.0* 02/23/2016   CHOLHDL 6 02/23/2016     Current Outpatient Prescriptions on File Prior to Visit  Medication Sig Dispense Refill  . ALPRAZolam (XANAX) 0.25 MG tablet Take 1 tablet (0.25 mg total) by mouth at bedtime as needed. 30 tablet 0  . calcium carbonate (OS-CAL) 600 MG TABS Take 1 tablet by mouth 2 (two) times daily.    . Cholecalciferol (VITAMIN D3) 2000 UNITS TABS Take 1 capsule by mouth daily.    . hydrochlorothiazide (HYDRODIURIL) 12.5 MG tablet Take 1 tablet (12.5 mg total) by mouth daily. 90 tablet 3  . Magnesium 250 MG TABS Take 1 tablet by mouth daily.    . vitamin E 400 UNIT capsule Take 400 Units by mouth daily.    Marland Kitchen lovastatin (MEVACOR) 20 MG tablet Take 1 tablet (20 mg total) by mouth at bedtime. (Patient not taking: Reported on 03/06/2016) 30 tablet 11   No  current facility-administered medications on file prior to visit.    Allergies  Allergen Reactions  . Hydrocodone Rash  . Demerol [Meperidine] Nausea And Vomiting    severe  . Sulfa Antibiotics Swelling and Rash    Past Medical History  Diagnosis Date  . Fibromyalgia   . Female pelvic pain   . Borderline hypertension   . Bone tumor (benign)     RIGHT FEMUR  . Arthritis     HANDS,  KNEES  . Left knee injury     TENDONDINITIS/  CHONDRAMALIA  . Anxiety   . PONV (postoperative nausea and vomiting)     Past Surgical History  Procedure Laterality Date  . Laparoscopic assisted vaginal hysterectomy  01-25-2003  . Tubal ligation    . Nasal septum surgery  AGE 69  . Dilation and curettage of uterus  [place]  . Tonsillectomy  AGE 43  . Laparoscopy N/A 04/18/2014    Procedure: LAPAROSCOPY DIAGNOSTIC;  Surgeon: Margarette Asal, MD;  Location: [facility];  Service: Gynecology;  Laterality: N/A;  . Salpingoophorectomy Bilateral 04/18/2014    Procedure: SALPINGO OOPHORECTOMY;  Surgeon: Margarette Asal, MD;  Location: [facility];  Service: Gynecology;  Laterality: Bilateral;    Family History  Problem Relation Age of Onset  . Colon  cancer Neg Hx   . Stomach cancer Neg Hx     Social History   Social History  . Marital Status: Married    Spouse Name: N/A  . Number of Children: N/A  . Years of Education: N/A   Occupational History  . Not on file.   Social History Main Topics  . Smoking status: Current Every Day Smoker -- 0.50 packs/day for 30 years    Types: Cigarettes  . Smokeless tobacco: Never Used  . Alcohol Use: Yes     Comment: rare  . Drug Use: No  . Sexual Activity: Not on file   Other Topics Concern  . Not on file   Social History Narrative   The PMH, PSH, Social History, Family History, Medications, and allergies have been reviewed in [facility], and have been updated if relevant.  Review of Systems    Constitutional: Positive for fatigue. Negative for fever and unexpected weight change.  HENT: Negative.   Respiratory: Negative.   Cardiovascular: Negative.   Gastrointestinal: Negative.   Endocrine: Negative.   Genitourinary: Negative.   Musculoskeletal: Positive for myalgias and arthralgias.  Skin: Negative.   Allergic/Immunologic: Negative.   Neurological: Negative.   Hematological: Negative.   Psychiatric/Behavioral: Negative.        Objective:    BP 132/72 mmHg  Pulse 74  Temp(Src) 98 F (36.7 C) (Oral)  Ht 5\' 8"  (1.727 m)  Wt 163 lb 4 oz (74.05 kg)  BMI 24.83 kg/m2  SpO2 98%   Physical Exam   General:  Well-developed,well-nourished,in no acute distress; alert,appropriate and cooperative throughout examination Head:  normocephalic and atraumatic.   Eyes:  vision grossly intact, pupils equal, pupils round, and pupils reactive to light.   Ears:  R ear normal and L ear normal.   Nose:  no external deformity.   Mouth:  good dentition.   Neck:  No deformities, masses, or tenderness noted. Breasts:  Deferred- has GYN Lungs:  Normal respiratory effort, chest expands symmetrically. Lungs are clear to auscultation, no crackles or wheezes. Heart:  Normal rate and regular rhythm. S1 and S2 normal without gallop, murmur, click, rub or other extra sounds. Abdomen:  Bowel sounds positive,abdomen soft and non-tender without masses, organomegaly or hernias noted. Genitalia: deferred- has GYN Msk:  No deformity or scoliosis noted of thoracic or lumbar spine.   Extremities:  No clubbing, cyanosis, edema, or deformity noted with normal full range of motion of all joints.   Neurologic:  alert & oriented X3 and gait normal.   Skin:  Intact without suspicious lesions or rashes Cervical Nodes:  No lymphadenopathy noted Axillary Nodes:  No palpable lymphadenopathy Psych:  Cognition and judgment appear intact. Alert and cooperative with normal attention span and concentration. No  apparent delusions, illusions, hallucinations       Assessment & Plan:   Well woman exam  Essential hypertension  HLD (hyperlipidemia)  Fibromyalgia No Follow-up on file.

## 2016-03-06 NOTE — Patient Instructions (Signed)
Great to see you. Please schedule an appointment with Dr. Copland on your way out. 

## 2016-03-06 NOTE — Addendum Note (Signed)
Addended by: Lucille Passy on: 03/06/2016 10:31 AM   Modules accepted: Orders, SmartSet

## 2016-03-06 NOTE — Progress Notes (Signed)
Pre visit review using our clinic review tool, if applicable. No additional management support is needed unless otherwise documented below in the visit note. 

## 2016-03-06 NOTE — Assessment & Plan Note (Signed)
Well controlled on current rx. No changes made. 

## 2016-03-06 NOTE — Assessment & Plan Note (Signed)
Refusing rx

## 2016-03-06 NOTE — Assessment & Plan Note (Signed)
Reviewed preventive care protocols, scheduled due services, and updated immunizations Discussed nutrition, exercise, diet, and healthy lifestyle.  

## 2016-03-13 ENCOUNTER — Ambulatory Visit: Payer: BLUE CROSS/BLUE SHIELD | Admitting: Family Medicine

## 2016-03-28 DIAGNOSIS — M545 Low back pain: Secondary | ICD-10-CM | POA: Diagnosis not present

## 2016-03-28 DIAGNOSIS — M25562 Pain in left knee: Secondary | ICD-10-CM | POA: Diagnosis not present

## 2016-04-02 DIAGNOSIS — M545 Low back pain: Secondary | ICD-10-CM | POA: Diagnosis not present

## 2016-04-04 DIAGNOSIS — M545 Low back pain: Secondary | ICD-10-CM | POA: Diagnosis not present

## 2016-04-08 DIAGNOSIS — M9901 Segmental and somatic dysfunction of cervical region: Secondary | ICD-10-CM | POA: Diagnosis not present

## 2016-04-08 DIAGNOSIS — M9904 Segmental and somatic dysfunction of sacral region: Secondary | ICD-10-CM | POA: Diagnosis not present

## 2016-04-08 DIAGNOSIS — M5412 Radiculopathy, cervical region: Secondary | ICD-10-CM | POA: Diagnosis not present

## 2016-04-09 DIAGNOSIS — M461 Sacroiliitis, not elsewhere classified: Secondary | ICD-10-CM | POA: Diagnosis not present

## 2016-04-09 DIAGNOSIS — M9901 Segmental and somatic dysfunction of cervical region: Secondary | ICD-10-CM | POA: Diagnosis not present

## 2016-04-10 ENCOUNTER — Telehealth: Payer: Self-pay | Admitting: Family Medicine

## 2016-04-10 NOTE — Telephone Encounter (Signed)
Spoke to pt who states she had a question regarding her bill for Hep C. She was unaware that these labs were being drawn, and wanting to confirm that she did, in fact, have it completed since she was billed for it

## 2016-04-10 NOTE — Telephone Encounter (Signed)
Rose please call pt to make sure her questions about billing were answered.

## 2016-04-10 NOTE — Telephone Encounter (Signed)
Pt called she has questions about the hep c  Labs she had done

## 2016-04-11 DIAGNOSIS — Z6824 Body mass index (BMI) 24.0-24.9, adult: Secondary | ICD-10-CM | POA: Diagnosis not present

## 2016-04-11 DIAGNOSIS — Z01419 Encounter for gynecological examination (general) (routine) without abnormal findings: Secondary | ICD-10-CM | POA: Diagnosis not present

## 2016-04-11 DIAGNOSIS — Z0142 Encounter for cervical smear to confirm findings of recent normal smear following initial abnormal smear: Secondary | ICD-10-CM | POA: Diagnosis not present

## 2016-04-11 DIAGNOSIS — Z1231 Encounter for screening mammogram for malignant neoplasm of breast: Secondary | ICD-10-CM | POA: Diagnosis not present

## 2016-04-15 DIAGNOSIS — M9901 Segmental and somatic dysfunction of cervical region: Secondary | ICD-10-CM | POA: Diagnosis not present

## 2016-04-15 DIAGNOSIS — M47812 Spondylosis without myelopathy or radiculopathy, cervical region: Secondary | ICD-10-CM | POA: Diagnosis not present

## 2016-04-15 DIAGNOSIS — M9905 Segmental and somatic dysfunction of pelvic region: Secondary | ICD-10-CM | POA: Diagnosis not present

## 2016-04-16 DIAGNOSIS — M461 Sacroiliitis, not elsewhere classified: Secondary | ICD-10-CM | POA: Diagnosis not present

## 2016-04-16 DIAGNOSIS — M9905 Segmental and somatic dysfunction of pelvic region: Secondary | ICD-10-CM | POA: Diagnosis not present

## 2016-04-16 DIAGNOSIS — M9901 Segmental and somatic dysfunction of cervical region: Secondary | ICD-10-CM | POA: Diagnosis not present

## 2016-04-17 ENCOUNTER — Other Ambulatory Visit: Payer: Self-pay | Admitting: Obstetrics and Gynecology

## 2016-04-17 DIAGNOSIS — R928 Other abnormal and inconclusive findings on diagnostic imaging of breast: Secondary | ICD-10-CM

## 2016-04-17 NOTE — Telephone Encounter (Signed)
Patient's questions have been answered.  Thank you!

## 2016-04-18 DIAGNOSIS — M9901 Segmental and somatic dysfunction of cervical region: Secondary | ICD-10-CM | POA: Diagnosis not present

## 2016-04-18 DIAGNOSIS — M9905 Segmental and somatic dysfunction of pelvic region: Secondary | ICD-10-CM | POA: Diagnosis not present

## 2016-04-18 DIAGNOSIS — M461 Sacroiliitis, not elsewhere classified: Secondary | ICD-10-CM | POA: Diagnosis not present

## 2016-04-22 ENCOUNTER — Ambulatory Visit
Admission: RE | Admit: 2016-04-22 | Discharge: 2016-04-22 | Disposition: A | Discharge status: Home / Self Care | Payer: BLUE CROSS/BLUE SHIELD | Source: Ambulatory Visit | Attending: Obstetrics and Gynecology | Admitting: Obstetrics and Gynecology

## 2016-04-22 ENCOUNTER — Other Ambulatory Visit: Payer: Self-pay | Admitting: Obstetrics and Gynecology

## 2016-04-22 DIAGNOSIS — N63 Unspecified lump in breast: Secondary | ICD-10-CM | POA: Diagnosis not present

## 2016-04-22 DIAGNOSIS — M461 Sacroiliitis, not elsewhere classified: Secondary | ICD-10-CM | POA: Diagnosis not present

## 2016-04-22 DIAGNOSIS — R928 Other abnormal and inconclusive findings on diagnostic imaging of breast: Secondary | ICD-10-CM

## 2016-04-22 DIAGNOSIS — M9901 Segmental and somatic dysfunction of cervical region: Secondary | ICD-10-CM | POA: Diagnosis not present

## 2016-04-23 DIAGNOSIS — M5412 Radiculopathy, cervical region: Secondary | ICD-10-CM | POA: Diagnosis not present

## 2016-04-23 DIAGNOSIS — M9901 Segmental and somatic dysfunction of cervical region: Secondary | ICD-10-CM | POA: Diagnosis not present

## 2016-04-23 DIAGNOSIS — M9905 Segmental and somatic dysfunction of pelvic region: Secondary | ICD-10-CM | POA: Diagnosis not present

## 2016-04-23 DIAGNOSIS — M461 Sacroiliitis, not elsewhere classified: Secondary | ICD-10-CM | POA: Diagnosis not present

## 2016-04-24 DIAGNOSIS — M461 Sacroiliitis, not elsewhere classified: Secondary | ICD-10-CM | POA: Diagnosis not present

## 2016-04-24 DIAGNOSIS — M9905 Segmental and somatic dysfunction of pelvic region: Secondary | ICD-10-CM | POA: Diagnosis not present

## 2016-04-24 DIAGNOSIS — M9901 Segmental and somatic dysfunction of cervical region: Secondary | ICD-10-CM | POA: Diagnosis not present

## 2016-04-24 DIAGNOSIS — M5412 Radiculopathy, cervical region: Secondary | ICD-10-CM | POA: Diagnosis not present

## 2016-04-29 DIAGNOSIS — M461 Sacroiliitis, not elsewhere classified: Secondary | ICD-10-CM | POA: Diagnosis not present

## 2016-04-29 DIAGNOSIS — M9901 Segmental and somatic dysfunction of cervical region: Secondary | ICD-10-CM | POA: Diagnosis not present

## 2016-04-29 DIAGNOSIS — M5412 Radiculopathy, cervical region: Secondary | ICD-10-CM | POA: Diagnosis not present

## 2016-04-29 DIAGNOSIS — M9905 Segmental and somatic dysfunction of pelvic region: Secondary | ICD-10-CM | POA: Diagnosis not present

## 2016-04-30 DIAGNOSIS — M9901 Segmental and somatic dysfunction of cervical region: Secondary | ICD-10-CM | POA: Diagnosis not present

## 2016-04-30 DIAGNOSIS — M461 Sacroiliitis, not elsewhere classified: Secondary | ICD-10-CM | POA: Diagnosis not present

## 2016-04-30 DIAGNOSIS — M9905 Segmental and somatic dysfunction of pelvic region: Secondary | ICD-10-CM | POA: Diagnosis not present

## 2016-05-02 DIAGNOSIS — M461 Sacroiliitis, not elsewhere classified: Secondary | ICD-10-CM | POA: Diagnosis not present

## 2016-05-02 DIAGNOSIS — M9901 Segmental and somatic dysfunction of cervical region: Secondary | ICD-10-CM | POA: Diagnosis not present

## 2016-05-08 DIAGNOSIS — M5412 Radiculopathy, cervical region: Secondary | ICD-10-CM | POA: Diagnosis not present

## 2016-05-08 DIAGNOSIS — M9901 Segmental and somatic dysfunction of cervical region: Secondary | ICD-10-CM | POA: Diagnosis not present

## 2016-05-08 DIAGNOSIS — M9905 Segmental and somatic dysfunction of pelvic region: Secondary | ICD-10-CM | POA: Diagnosis not present

## 2016-05-08 DIAGNOSIS — M461 Sacroiliitis, not elsewhere classified: Secondary | ICD-10-CM | POA: Diagnosis not present

## 2016-05-14 DIAGNOSIS — M9901 Segmental and somatic dysfunction of cervical region: Secondary | ICD-10-CM | POA: Diagnosis not present

## 2016-05-14 DIAGNOSIS — M461 Sacroiliitis, not elsewhere classified: Secondary | ICD-10-CM | POA: Diagnosis not present

## 2016-05-14 DIAGNOSIS — M9905 Segmental and somatic dysfunction of pelvic region: Secondary | ICD-10-CM | POA: Diagnosis not present

## 2016-05-15 DIAGNOSIS — J069 Acute upper respiratory infection, unspecified: Secondary | ICD-10-CM | POA: Diagnosis not present

## 2016-05-15 DIAGNOSIS — J309 Allergic rhinitis, unspecified: Secondary | ICD-10-CM | POA: Diagnosis not present

## 2016-05-16 DIAGNOSIS — M461 Sacroiliitis, not elsewhere classified: Secondary | ICD-10-CM | POA: Diagnosis not present

## 2016-05-16 DIAGNOSIS — M9901 Segmental and somatic dysfunction of cervical region: Secondary | ICD-10-CM | POA: Diagnosis not present

## 2016-05-16 DIAGNOSIS — M9905 Segmental and somatic dysfunction of pelvic region: Secondary | ICD-10-CM | POA: Diagnosis not present

## 2016-05-20 DIAGNOSIS — M9901 Segmental and somatic dysfunction of cervical region: Secondary | ICD-10-CM | POA: Diagnosis not present

## 2016-05-20 DIAGNOSIS — M461 Sacroiliitis, not elsewhere classified: Secondary | ICD-10-CM | POA: Diagnosis not present

## 2016-05-20 DIAGNOSIS — M9905 Segmental and somatic dysfunction of pelvic region: Secondary | ICD-10-CM | POA: Diagnosis not present

## 2016-05-23 DIAGNOSIS — M5412 Radiculopathy, cervical region: Secondary | ICD-10-CM | POA: Diagnosis not present

## 2016-05-23 DIAGNOSIS — M461 Sacroiliitis, not elsewhere classified: Secondary | ICD-10-CM | POA: Diagnosis not present

## 2016-05-23 DIAGNOSIS — M9905 Segmental and somatic dysfunction of pelvic region: Secondary | ICD-10-CM | POA: Diagnosis not present

## 2016-05-23 DIAGNOSIS — M9901 Segmental and somatic dysfunction of cervical region: Secondary | ICD-10-CM | POA: Diagnosis not present

## 2016-05-28 DIAGNOSIS — M9905 Segmental and somatic dysfunction of pelvic region: Secondary | ICD-10-CM | POA: Diagnosis not present

## 2016-05-28 DIAGNOSIS — M9901 Segmental and somatic dysfunction of cervical region: Secondary | ICD-10-CM | POA: Diagnosis not present

## 2016-05-28 DIAGNOSIS — M5412 Radiculopathy, cervical region: Secondary | ICD-10-CM | POA: Diagnosis not present

## 2016-06-04 DIAGNOSIS — M9905 Segmental and somatic dysfunction of pelvic region: Secondary | ICD-10-CM | POA: Diagnosis not present

## 2016-06-04 DIAGNOSIS — M9901 Segmental and somatic dysfunction of cervical region: Secondary | ICD-10-CM | POA: Diagnosis not present

## 2016-06-19 DIAGNOSIS — M9905 Segmental and somatic dysfunction of pelvic region: Secondary | ICD-10-CM | POA: Diagnosis not present

## 2016-06-19 DIAGNOSIS — M461 Sacroiliitis, not elsewhere classified: Secondary | ICD-10-CM | POA: Diagnosis not present

## 2016-06-19 DIAGNOSIS — M9901 Segmental and somatic dysfunction of cervical region: Secondary | ICD-10-CM | POA: Diagnosis not present

## 2016-06-19 DIAGNOSIS — M5412 Radiculopathy, cervical region: Secondary | ICD-10-CM | POA: Diagnosis not present

## 2016-06-24 DIAGNOSIS — M9905 Segmental and somatic dysfunction of pelvic region: Secondary | ICD-10-CM | POA: Diagnosis not present

## 2016-06-24 DIAGNOSIS — M9901 Segmental and somatic dysfunction of cervical region: Secondary | ICD-10-CM | POA: Diagnosis not present

## 2016-06-24 DIAGNOSIS — M5412 Radiculopathy, cervical region: Secondary | ICD-10-CM | POA: Diagnosis not present

## 2016-06-24 DIAGNOSIS — M461 Sacroiliitis, not elsewhere classified: Secondary | ICD-10-CM | POA: Diagnosis not present

## 2016-06-26 DIAGNOSIS — M9905 Segmental and somatic dysfunction of pelvic region: Secondary | ICD-10-CM | POA: Diagnosis not present

## 2016-06-26 DIAGNOSIS — M461 Sacroiliitis, not elsewhere classified: Secondary | ICD-10-CM | POA: Diagnosis not present

## 2016-06-26 DIAGNOSIS — M5412 Radiculopathy, cervical region: Secondary | ICD-10-CM | POA: Diagnosis not present

## 2016-06-26 DIAGNOSIS — M9901 Segmental and somatic dysfunction of cervical region: Secondary | ICD-10-CM | POA: Diagnosis not present

## 2016-07-01 DIAGNOSIS — M9901 Segmental and somatic dysfunction of cervical region: Secondary | ICD-10-CM | POA: Diagnosis not present

## 2016-07-01 DIAGNOSIS — M5384 Other specified dorsopathies, thoracic region: Secondary | ICD-10-CM | POA: Diagnosis not present

## 2016-07-01 DIAGNOSIS — M9902 Segmental and somatic dysfunction of thoracic region: Secondary | ICD-10-CM | POA: Diagnosis not present

## 2016-07-01 DIAGNOSIS — M7541 Impingement syndrome of right shoulder: Secondary | ICD-10-CM | POA: Diagnosis not present

## 2016-07-01 DIAGNOSIS — M25511 Pain in right shoulder: Secondary | ICD-10-CM | POA: Diagnosis not present

## 2016-07-03 DIAGNOSIS — M5384 Other specified dorsopathies, thoracic region: Secondary | ICD-10-CM | POA: Diagnosis not present

## 2016-07-03 DIAGNOSIS — M9902 Segmental and somatic dysfunction of thoracic region: Secondary | ICD-10-CM | POA: Diagnosis not present

## 2016-07-03 DIAGNOSIS — M5412 Radiculopathy, cervical region: Secondary | ICD-10-CM | POA: Diagnosis not present

## 2016-07-03 DIAGNOSIS — M9901 Segmental and somatic dysfunction of cervical region: Secondary | ICD-10-CM | POA: Diagnosis not present

## 2016-07-08 DIAGNOSIS — M5412 Radiculopathy, cervical region: Secondary | ICD-10-CM | POA: Diagnosis not present

## 2016-07-08 DIAGNOSIS — M5384 Other specified dorsopathies, thoracic region: Secondary | ICD-10-CM | POA: Diagnosis not present

## 2016-07-08 DIAGNOSIS — M9901 Segmental and somatic dysfunction of cervical region: Secondary | ICD-10-CM | POA: Diagnosis not present

## 2016-07-08 DIAGNOSIS — M9902 Segmental and somatic dysfunction of thoracic region: Secondary | ICD-10-CM | POA: Diagnosis not present

## 2016-07-11 DIAGNOSIS — M5412 Radiculopathy, cervical region: Secondary | ICD-10-CM | POA: Diagnosis not present

## 2016-07-11 DIAGNOSIS — M9901 Segmental and somatic dysfunction of cervical region: Secondary | ICD-10-CM | POA: Diagnosis not present

## 2016-07-11 DIAGNOSIS — M9902 Segmental and somatic dysfunction of thoracic region: Secondary | ICD-10-CM | POA: Diagnosis not present

## 2016-07-11 DIAGNOSIS — M5384 Other specified dorsopathies, thoracic region: Secondary | ICD-10-CM | POA: Diagnosis not present

## 2016-07-15 DIAGNOSIS — M9901 Segmental and somatic dysfunction of cervical region: Secondary | ICD-10-CM | POA: Diagnosis not present

## 2016-07-15 DIAGNOSIS — M5384 Other specified dorsopathies, thoracic region: Secondary | ICD-10-CM | POA: Diagnosis not present

## 2016-07-15 DIAGNOSIS — M5412 Radiculopathy, cervical region: Secondary | ICD-10-CM | POA: Diagnosis not present

## 2016-07-18 DIAGNOSIS — M5412 Radiculopathy, cervical region: Secondary | ICD-10-CM | POA: Diagnosis not present

## 2016-07-18 DIAGNOSIS — M5384 Other specified dorsopathies, thoracic region: Secondary | ICD-10-CM | POA: Diagnosis not present

## 2016-07-18 DIAGNOSIS — M9902 Segmental and somatic dysfunction of thoracic region: Secondary | ICD-10-CM | POA: Diagnosis not present

## 2016-07-18 DIAGNOSIS — M9901 Segmental and somatic dysfunction of cervical region: Secondary | ICD-10-CM | POA: Diagnosis not present

## 2016-07-22 DIAGNOSIS — M5384 Other specified dorsopathies, thoracic region: Secondary | ICD-10-CM | POA: Diagnosis not present

## 2016-07-22 DIAGNOSIS — M9901 Segmental and somatic dysfunction of cervical region: Secondary | ICD-10-CM | POA: Diagnosis not present

## 2016-07-22 DIAGNOSIS — M9902 Segmental and somatic dysfunction of thoracic region: Secondary | ICD-10-CM | POA: Diagnosis not present

## 2016-07-22 DIAGNOSIS — M5412 Radiculopathy, cervical region: Secondary | ICD-10-CM | POA: Diagnosis not present

## 2016-07-25 DIAGNOSIS — M9902 Segmental and somatic dysfunction of thoracic region: Secondary | ICD-10-CM | POA: Diagnosis not present

## 2016-07-25 DIAGNOSIS — M5412 Radiculopathy, cervical region: Secondary | ICD-10-CM | POA: Diagnosis not present

## 2016-07-25 DIAGNOSIS — M9901 Segmental and somatic dysfunction of cervical region: Secondary | ICD-10-CM | POA: Diagnosis not present

## 2016-07-25 DIAGNOSIS — M5384 Other specified dorsopathies, thoracic region: Secondary | ICD-10-CM | POA: Diagnosis not present

## 2016-07-29 ENCOUNTER — Encounter: Payer: Self-pay | Admitting: Family Medicine

## 2016-07-29 ENCOUNTER — Ambulatory Visit (INDEPENDENT_AMBULATORY_CARE_PROVIDER_SITE_OTHER): Payer: BLUE CROSS/BLUE SHIELD | Admitting: Family Medicine

## 2016-07-29 VITALS — BP 142/82 | HR 70 | Temp 98.0°F | Wt 164.5 lb

## 2016-07-29 DIAGNOSIS — I10 Essential (primary) hypertension: Secondary | ICD-10-CM

## 2016-07-29 MED ORDER — HYDROCHLOROTHIAZIDE 12.5 MG PO TABS: 12.5000 mg | ORAL_TABLET | Freq: Every day | ORAL | 3 refills | Status: DC

## 2016-07-29 NOTE — Progress Notes (Signed)
Pre visit review using our clinic review tool, if applicable. No additional management support is needed unless otherwise documented below in the visit note. 

## 2016-07-29 NOTE — Progress Notes (Signed)
Subjective:   Patient ID: [name] [name], female    DOB: 1956/12/10, 60 y.o.   MRN: [id number]  [name] [name] is a pleasant 60 y.o. year old female who presents to clinic today with Follow-up (BP) and follow up of chronic medical conditions on 07/29/2016  HPI:    HTN- currently taking HCTZ 12.5 mg daily. Has had some intermittent elevated readings. Denies HA, blurred vision, CP or SOB.  Has been out of rx for weeks.  Lab Results  Component Value Date   CREATININE 0.82 02/23/2016       Current Outpatient Prescriptions on File Prior to Visit  Medication Sig Dispense Refill  . calcium carbonate (OS-CAL) 600 MG TABS Take 1 tablet by mouth 2 (two) times daily.    . Cholecalciferol (VITAMIN D3) 2000 UNITS TABS Take 1 capsule by mouth daily.    . hydrochlorothiazide (HYDRODIURIL) 12.5 MG tablet Take 1 tablet (12.5 mg total) by mouth daily. 90 tablet 3  . Magnesium 250 MG TABS Take 1 tablet by mouth daily.    . vitamin E 400 UNIT capsule Take 400 Units by mouth daily.     No current facility-administered medications on file prior to visit.     Allergies  Allergen Reactions  . Hydrocodone Rash  . Demerol [Meperidine] Nausea And Vomiting    severe  . Sulfa Antibiotics Swelling and Rash    Past Medical History:  Diagnosis Date  . Anxiety   . Arthritis    HANDS,  KNEES  . Bone tumor (benign)    RIGHT FEMUR  . Borderline hypertension   . Female pelvic pain   . Fibromyalgia   . Left knee injury    TENDONDINITIS/  CHONDRAMALIA  . PONV (postoperative nausea and vomiting)     Past Surgical History:  Procedure Laterality Date  . DILATION AND CURETTAGE OF UTERUS  1986  &  1988   RETAINED PLACENTA  . LAPAROSCOPIC ASSISTED VAGINAL HYSTERECTOMY  01-25-2003  . LAPAROSCOPY N/A 04/18/2014   Procedure: LAPAROSCOPY DIAGNOSTIC;  Surgeon: Margarette Asal, MD;  Location: [facility];  Service: Gynecology;  Laterality: N/A;  . NASAL SEPTUM SURGERY  AGE 53  .  SALPINGOOPHORECTOMY Bilateral 04/18/2014   Procedure: SALPINGO OOPHORECTOMY;  Surgeon: Margarette Asal, MD;  Location: [facility];  Service: Gynecology;  Laterality: Bilateral;  . TONSILLECTOMY  AGE 45  . TUBAL LIGATION      Family History  Problem Relation Age of Onset  . Colon cancer Neg Hx   . Stomach cancer Neg Hx     Social History   Social History  . Marital status: Married    Spouse name: N/A  . Number of children: N/A  . Years of education: N/A   Occupational History  . Not on file.   Social History Main Topics  . Smoking status: Current Every Day Smoker    Packs/day: 0.50    Years: 30.00    Types: Cigarettes  . Smokeless tobacco: Never Used  . Alcohol use Yes     Comment: rare  . Drug use: No  . Sexual activity: Not on file   Other Topics Concern  . Not on file   Social History Narrative  . No narrative on file   The PMH, PSH, Social History, Family History, Medications, and allergies have been reviewed in [facility], and have been updated if relevant.  Review of Systems  Constitutional: Positive for fatigue. Negative for fever and unexpected weight change.  HENT: Negative.   Respiratory: Negative.   Cardiovascular: Negative.   Gastrointestinal: Negative.   Endocrine: Negative.   Genitourinary: Negative.   Musculoskeletal: Negative for arthralgias and myalgias.  Skin: Negative.   Allergic/Immunologic: Negative.   Neurological: Negative.   Hematological: Negative.   Psychiatric/Behavioral: Negative.        Objective:    BP (!) 142/82   Pulse 70   Temp 98 F (36.7 C) (Oral)   Wt 164 lb 8 oz (74.6 kg)   SpO2 98%   BMI 25.01 kg/m    Physical Exam   General:  Well-developed,well-nourished,in no acute distress; alert,appropriate and cooperative throughout examination Head:  normocephalic and atraumatic.   Eyes:  vision grossly intact, pupils equal, pupils round, and pupils reactive to light.   Ears:  R ear normal and L ear  normal.   Nose:  no external deformity.   Mouth:  good dentition.   Neck:  No deformities, masses, or tenderness noted. Lungs:  Normal respiratory effort, chest expands symmetrically. Lungs are clear to auscultation, no crackles or wheezes. Heart:  Normal rate and regular rhythm. S1 and S2 normal without gallop, murmur, click, rub or other extra sounds. Msk:  No deformity or scoliosis noted of thoracic or lumbar spine.   Extremities:  No clubbing, cyanosis, edema, or deformity noted with normal full range of motion of all joints.   Neurologic:  alert & oriented X3 and gait normal.   Skin:  Intact without suspicious lesions or rashes Psych:  Cognition and judgment appear intact. Alert and cooperative with normal attention span and concentration. No apparent delusions, illusions, hallucinations       Assessment & Plan:   Essential hypertension No Follow-up on file.

## 2016-07-29 NOTE — Assessment & Plan Note (Signed)
Well controlled when she is compliant with rx. eRx refills sent for HCTZ. Follow up in 1 year. The patient indicates understanding of these issues and agrees with the plan.

## 2016-07-30 DIAGNOSIS — M9901 Segmental and somatic dysfunction of cervical region: Secondary | ICD-10-CM | POA: Diagnosis not present

## 2016-07-30 DIAGNOSIS — M9902 Segmental and somatic dysfunction of thoracic region: Secondary | ICD-10-CM | POA: Diagnosis not present

## 2016-07-30 DIAGNOSIS — M5384 Other specified dorsopathies, thoracic region: Secondary | ICD-10-CM | POA: Diagnosis not present

## 2016-07-30 DIAGNOSIS — M5412 Radiculopathy, cervical region: Secondary | ICD-10-CM | POA: Diagnosis not present

## 2016-08-06 DIAGNOSIS — M5384 Other specified dorsopathies, thoracic region: Secondary | ICD-10-CM | POA: Diagnosis not present

## 2016-08-06 DIAGNOSIS — M5412 Radiculopathy, cervical region: Secondary | ICD-10-CM | POA: Diagnosis not present

## 2016-08-06 DIAGNOSIS — M9901 Segmental and somatic dysfunction of cervical region: Secondary | ICD-10-CM | POA: Diagnosis not present

## 2016-08-12 DIAGNOSIS — M5384 Other specified dorsopathies, thoracic region: Secondary | ICD-10-CM | POA: Diagnosis not present

## 2016-08-12 DIAGNOSIS — M5412 Radiculopathy, cervical region: Secondary | ICD-10-CM | POA: Diagnosis not present

## 2016-08-12 DIAGNOSIS — M9902 Segmental and somatic dysfunction of thoracic region: Secondary | ICD-10-CM | POA: Diagnosis not present

## 2016-08-12 DIAGNOSIS — M9901 Segmental and somatic dysfunction of cervical region: Secondary | ICD-10-CM | POA: Diagnosis not present

## 2016-08-20 DIAGNOSIS — M9902 Segmental and somatic dysfunction of thoracic region: Secondary | ICD-10-CM | POA: Diagnosis not present

## 2016-08-20 DIAGNOSIS — M5384 Other specified dorsopathies, thoracic region: Secondary | ICD-10-CM | POA: Diagnosis not present

## 2016-08-20 DIAGNOSIS — M5412 Radiculopathy, cervical region: Secondary | ICD-10-CM | POA: Diagnosis not present

## 2016-08-20 DIAGNOSIS — M9901 Segmental and somatic dysfunction of cervical region: Secondary | ICD-10-CM | POA: Diagnosis not present

## 2016-08-27 ENCOUNTER — Telehealth: Payer: Self-pay | Admitting: Family Medicine

## 2016-08-27 DIAGNOSIS — M9901 Segmental and somatic dysfunction of cervical region: Secondary | ICD-10-CM | POA: Diagnosis not present

## 2016-08-27 DIAGNOSIS — M5412 Radiculopathy, cervical region: Secondary | ICD-10-CM | POA: Diagnosis not present

## 2016-08-27 DIAGNOSIS — M5384 Other specified dorsopathies, thoracic region: Secondary | ICD-10-CM | POA: Diagnosis not present

## 2016-08-27 DIAGNOSIS — M9902 Segmental and somatic dysfunction of thoracic region: Secondary | ICD-10-CM | POA: Diagnosis not present

## 2016-08-27 NOTE — Telephone Encounter (Signed)
°  Patient Name: [name] [name]  DOB: 20-Aug-1956    Initial Comment Caller states thinks having side effects of meds; off/on for 18 mo; having -sees flashes of white light strobing from time to time/blurred vision and muscle cramps; dark urine; feels dizzy; cholesterol 222, feet hurt and feels like gout in big toe; cannot take sulfa drugs; was in 3 wks ago for BP check;    Nurse Assessment  Nurse: Julien Girt, RN, Almyra Free Date/Time Eilene Ghazi Time): 08/27/2016 11:46:51 AM  Confirm and document reason for call. If symptomatic, describe symptoms. You must click the next button to save text entered. ---Caller states thinks she is having side effects of medication, HCTZ that she has been taking off and on for the last 18 mths. She has also had a knee injury and neck/back pain. She was seen 3-4 weeks ago for B/P check, she had been off her medication for 3 days ( adds ran out at one point) and was advised to take it daily. For the last 3 weeks( since resuming med) she has been having flashes of white light, her vision has been blurred, her urine is dark, she has been dizzy and feels like she has gout in her big toe. Her foot pain began 6 weeks ago. This morning she has a mild headache, no other sx except for her anxiety from losing her job.  Has the patient traveled out of the country within the last 30 days? ---Not Applicable  Does the patient have any new or worsening symptoms? ---Yes  Will a triage be completed? ---Yes  Related visit to physician within the last 2 weeks? ---No  Does the PT have any chronic conditions? (i.e. diabetes, asthma, etc.) ---Yes  List chronic conditions. ---Htn, Hx left knee/back/neck injury( work related) , Fibromyalgia, Asthma Allergic: Sulfa  Is this a behavioral health or substance abuse call? ---No     Guidelines    Guideline Title Affirmed Question Affirmed Notes  Headache [1] MODERATE headache (e.g., interferes with normal activities) AND [2] present > 24 hours AND [3] unexplained  (Exceptions: analgesics not tried, typical migraine, or headache part of viral illness)    Final Disposition User   See Physician within 24 Hours Julien Girt, RN, Almyra Free    Referrals  REFERRED TO PCP OFFICE   Disagree/Comply: Leta Baptist

## 2016-08-27 NOTE — Telephone Encounter (Signed)
Pt has appt with Dr Deborra Medina on 08/28/16 at 10:45.

## 2016-08-28 ENCOUNTER — Encounter: Payer: Self-pay | Admitting: Family Medicine

## 2016-08-28 ENCOUNTER — Telehealth: Payer: Self-pay | Admitting: Family Medicine

## 2016-08-28 ENCOUNTER — Ambulatory Visit (INDEPENDENT_AMBULATORY_CARE_PROVIDER_SITE_OTHER): Payer: BLUE CROSS/BLUE SHIELD | Admitting: Family Medicine

## 2016-08-28 DIAGNOSIS — H539 Unspecified visual disturbance: Secondary | ICD-10-CM | POA: Diagnosis not present

## 2016-08-28 DIAGNOSIS — R42 Dizziness and giddiness: Secondary | ICD-10-CM | POA: Diagnosis not present

## 2016-08-28 DIAGNOSIS — I10 Essential (primary) hypertension: Secondary | ICD-10-CM | POA: Diagnosis not present

## 2016-08-28 NOTE — Assessment & Plan Note (Signed)
?   Orthostatic hypotension. D/c HCTZ. Follow up in 2 weeks. The patient indicates understanding of these issues and agrees with the plan.

## 2016-08-28 NOTE — Assessment & Plan Note (Signed)
Advised to make appt with her eye doctor ASAP, today. The patient indicates understanding of these issues and agrees with the plan.

## 2016-08-28 NOTE — Progress Notes (Signed)
Pre visit review using our clinic review tool, if applicable. No additional management support is needed unless otherwise documented below in the visit note. 

## 2016-08-28 NOTE — Telephone Encounter (Signed)
Pt will have eye appt on 09/27. No call back needed

## 2016-08-28 NOTE — Progress Notes (Signed)
Subjective:   Patient ID: [name] [name], female    DOB: 03/20/1956, 60 y.o.   MRN: [id number]  [name] [name] is a pleasant 60 y.o. year old female who presents to clinic today with Headache (since starting BP meds daily)  on 08/28/2016  HPI:  Called team health yesterday with multiple complaints.  On and off for 18 months, seeing flashes of white lights, headache, weakness, feels dizzy when she stands.  Feels this all started when she restarted HCTZ.  Has been taking HCTZ 12.5 mg daily for HTN. Lab Results  Component Value Date   CREATININE 0.82 02/23/2016   BP Readings from Last 3 Encounters:  08/28/16 114/68  07/29/16 (!) 142/82  03/06/16 132/72   Current Outpatient Prescriptions on File Prior to Visit  Medication Sig Dispense Refill  . calcium carbonate (OS-CAL) 600 MG TABS Take 1 tablet by mouth 2 (two) times daily.    . Cholecalciferol (VITAMIN D3) 2000 UNITS TABS Take 1 capsule by mouth daily.    . hydrochlorothiazide (HYDRODIURIL) 12.5 MG tablet Take 1 tablet (12.5 mg total) by mouth daily. 90 tablet 3  . Magnesium 250 MG TABS Take 1 tablet by mouth daily.    . vitamin E 400 UNIT capsule Take 400 Units by mouth daily.     No current facility-administered medications on file prior to visit.     Allergies  Allergen Reactions  . Hydrocodone Rash  . Demerol [Meperidine] Nausea And Vomiting    severe  . Sulfa Antibiotics Swelling and Rash    Past Medical History:  Diagnosis Date  . Anxiety   . Arthritis    HANDS,  KNEES  . Bone tumor (benign)    RIGHT FEMUR  . Borderline hypertension   . Female pelvic pain   . Fibromyalgia   . Left knee injury    TENDONDINITIS/  CHONDRAMALIA  . PONV (postoperative nausea and vomiting)     Past Surgical History:  Procedure Laterality Date  . DILATION AND CURETTAGE OF UTERUS  1986  &  1988   RETAINED PLACENTA  . LAPAROSCOPIC ASSISTED VAGINAL HYSTERECTOMY  01-25-2003  . LAPAROSCOPY N/A 04/18/2014   Procedure: LAPAROSCOPY  DIAGNOSTIC;  Surgeon: Margarette Asal, MD;  Location: [facility];  Service: Gynecology;  Laterality: N/A;  . NASAL SEPTUM SURGERY  AGE 40  . SALPINGOOPHORECTOMY Bilateral 04/18/2014   Procedure: SALPINGO OOPHORECTOMY;  Surgeon: Margarette Asal, MD;  Location: [facility];  Service: Gynecology;  Laterality: Bilateral;  . TONSILLECTOMY  AGE 17  . TUBAL LIGATION      Family History  Problem Relation Age of Onset  . Colon cancer Neg Hx   . Stomach cancer Neg Hx     Social History   Social History  . Marital status: Married    Spouse name: N/A  . Number of children: N/A  . Years of education: N/A   Occupational History  . Not on file.   Social History Main Topics  . Smoking status: Current Every Day Smoker    Packs/day: 0.50    Years: 30.00    Types: Cigarettes  . Smokeless tobacco: Never Used  . Alcohol use Yes     Comment: rare  . Drug use: No  . Sexual activity: Not on file   Other Topics Concern  . Not on file   Social History Narrative  . No narrative on file   The PMH, PSH, Social History, Family History, Medications, and allergies have  been reviewed in [facility], and have been updated if relevant.   Review of Systems  Constitutional: Negative.   Eyes: Positive for visual disturbance.  Respiratory: Negative.   Cardiovascular: Negative.   Neurological: Positive for dizziness, light-headedness and headaches. Negative for tremors, seizures, syncope, facial asymmetry, speech difficulty, weakness and numbness.  All other systems reviewed and are negative.      Objective:    BP 114/68   Pulse 77   Temp 98.3 F (36.8 C) (Oral)   Wt 165 lb (74.8 kg)   SpO2 97%   BMI 25.09 kg/m    Physical Exam  Constitutional: She is oriented to person, place, and time. She appears well-developed and well-nourished. No distress.  HENT:  Head: Normocephalic and atraumatic.  Eyes: Conjunctivae and EOM are normal.  Neck: Neck supple.    Cardiovascular: Normal rate and regular rhythm.   Pulmonary/Chest: Breath sounds normal.  Musculoskeletal: Normal range of motion. She exhibits no edema.  Neurological: She is alert and oriented to person, place, and time. No cranial nerve deficit.  Skin: Skin is warm and dry. She is not diaphoretic.  Psychiatric: She has a normal mood and affect. Her behavior is normal. Judgment and thought content normal.  Nursing note and vitals reviewed.         Assessment & Plan:   No diagnosis found. No Follow-up on file.

## 2016-08-28 NOTE — Patient Instructions (Signed)
Great to see you. Please STOP taking your HCTZ (blood pressure medication) Please call your eye doctor TODAY.  Please come see me in 2 weeks.

## 2016-08-28 NOTE — Assessment & Plan Note (Signed)
D/c HCTZ. See below

## 2016-09-11 ENCOUNTER — Encounter: Payer: Self-pay | Admitting: Family Medicine

## 2016-09-11 ENCOUNTER — Ambulatory Visit (INDEPENDENT_AMBULATORY_CARE_PROVIDER_SITE_OTHER): Payer: BLUE CROSS/BLUE SHIELD | Admitting: Family Medicine

## 2016-09-11 VITALS — BP 128/68 | HR 70 | Temp 98.3°F | Wt 164.5 lb

## 2016-09-11 DIAGNOSIS — I10 Essential (primary) hypertension: Secondary | ICD-10-CM

## 2016-09-11 DIAGNOSIS — R42 Dizziness and giddiness: Secondary | ICD-10-CM

## 2016-09-11 NOTE — Progress Notes (Signed)
Subjective:   Patient ID: [name] [name], female    DOB: October 06, 1956, 60 y.o.   MRN: [id number]  [name] [name] is a pleasant 60 y.o. year old female who presents to clinic today with Follow-up  on 09/11/2016  HPI:  Saw her two weeks ago for dizziness which seemed consistent with orthostatic hypotension. D/c'd HCTZ 12.5 mg daily and advised her to follow up with me here today.   Dizziness has resolved.  Feels good.  Current Outpatient Prescriptions on File Prior to Visit  Medication Sig Dispense Refill  . calcium carbonate (OS-CAL) 600 MG TABS Take 1 tablet by mouth 2 (two) times daily.    . Cholecalciferol (VITAMIN D3) 2000 UNITS TABS Take 1 capsule by mouth daily.    . Magnesium 250 MG TABS Take 1 tablet by mouth daily.    . vitamin E 400 UNIT capsule Take 400 Units by mouth daily.    . hydrochlorothiazide (HYDRODIURIL) 12.5 MG tablet Take 1 tablet (12.5 mg total) by mouth daily. (Patient not taking: Reported on 09/11/2016) 90 tablet 3   No current facility-administered medications on file prior to visit.     Allergies  Allergen Reactions  . Hydrocodone Rash  . Demerol [Meperidine] Nausea And Vomiting    severe  . Sulfa Antibiotics Swelling and Rash    Past Medical History:  Diagnosis Date  . Anxiety   . Arthritis    HANDS,  KNEES  . Bone tumor (benign)    RIGHT FEMUR  . Borderline hypertension   . Female pelvic pain   . Fibromyalgia   . Left knee injury    TENDONDINITIS/  CHONDRAMALIA  . PONV (postoperative nausea and vomiting)     Past Surgical History:  Procedure Laterality Date  . DILATION AND CURETTAGE OF UTERUS  1986  &  1988   RETAINED PLACENTA  . LAPAROSCOPIC ASSISTED VAGINAL HYSTERECTOMY  01-25-2003  . LAPAROSCOPY N/A 04/18/2014   Procedure: LAPAROSCOPY DIAGNOSTIC;  Surgeon: Margarette Asal, MD;  Location: [facility];  Service: Gynecology;  Laterality: N/A;  . NASAL SEPTUM SURGERY  AGE 70  . SALPINGOOPHORECTOMY Bilateral 04/18/2014   Procedure: SALPINGO OOPHORECTOMY;  Surgeon: Margarette Asal, MD;  Location: [facility];  Service: Gynecology;  Laterality: Bilateral;  . TONSILLECTOMY  AGE 28  . TUBAL LIGATION      Family History  Problem Relation Age of Onset  . Colon cancer Neg Hx   . Stomach cancer Neg Hx     Social History   Social History  . Marital status: Married    Spouse name: N/A  . Number of children: N/A  . Years of education: N/A   Occupational History  . Not on file.   Social History Main Topics  . Smoking status: Current Every Day Smoker    Packs/day: 0.50    Years: 30.00    Types: Cigarettes  . Smokeless tobacco: Never Used  . Alcohol use Yes     Comment: rare  . Drug use: No  . Sexual activity: Not on file   Other Topics Concern  . Not on file   Social History Narrative  . No narrative on file   The PMH, PSH, Social History, Family History, Medications, and allergies have been reviewed in [facility], and have been updated if relevant.   Review of Systems  Neurological: Negative.        Objective:    BP 128/68   Pulse 70   Temp 98.3  F (36.8 C) (Oral)   Wt 164 lb 8 oz (74.6 kg)   SpO2 97%   BMI 25.01 kg/m    Physical Exam  Constitutional: She is oriented to person, place, and time. She appears well-developed and well-nourished. No distress.  HENT:  Head: Normocephalic.  Eyes: Conjunctivae are normal.  Cardiovascular: Normal rate and regular rhythm.   Musculoskeletal: Normal range of motion.  Neurological: She is alert and oriented to person, place, and time. No cranial nerve deficit.  Skin: Skin is warm and dry. She is not diaphoretic.  Psychiatric: She has a normal mood and affect. Her behavior is normal. Judgment and thought content normal.  Nursing note and vitals reviewed.         Assessment & Plan:   Dizziness and giddiness  Essential hypertension No Follow-up on file.

## 2016-09-11 NOTE — Assessment & Plan Note (Signed)
Resolved without HCTZ. Consistent with orthostatic hypotension. Will not restart HCTZ. She will keep an eye on her BPs at home. The patient indicates understanding of these issues and agrees with the plan.

## 2016-09-11 NOTE — Progress Notes (Signed)
Pre visit review using our clinic review tool, if applicable. No additional management support is needed unless otherwise documented below in the visit note. 

## 2016-10-29 ENCOUNTER — Other Ambulatory Visit: Payer: Self-pay | Admitting: Obstetrics and Gynecology

## 2016-10-29 DIAGNOSIS — N6310 Unspecified lump in the right breast, unspecified quadrant: Secondary | ICD-10-CM

## 2016-11-14 ENCOUNTER — Ambulatory Visit
Admission: RE | Admit: 2016-11-14 | Discharge: 2016-11-14 | Disposition: A | Discharge status: Home / Self Care | Payer: BLUE CROSS/BLUE SHIELD | Source: Ambulatory Visit | Attending: Obstetrics and Gynecology | Admitting: Obstetrics and Gynecology

## 2016-11-14 ENCOUNTER — Other Ambulatory Visit: Payer: Self-pay | Admitting: Obstetrics and Gynecology

## 2016-11-14 DIAGNOSIS — N6310 Unspecified lump in the right breast, unspecified quadrant: Secondary | ICD-10-CM

## 2016-11-19 DIAGNOSIS — M545 Low back pain: Secondary | ICD-10-CM | POA: Diagnosis not present

## 2017-01-03 DIAGNOSIS — J111 Influenza due to unidentified influenza virus with other respiratory manifestations: Secondary | ICD-10-CM | POA: Diagnosis not present

## 2017-01-03 DIAGNOSIS — H6692 Otitis media, unspecified, left ear: Secondary | ICD-10-CM | POA: Diagnosis not present

## 2017-01-15 ENCOUNTER — Ambulatory Visit (INDEPENDENT_AMBULATORY_CARE_PROVIDER_SITE_OTHER): Payer: BLUE CROSS/BLUE SHIELD | Admitting: Family Medicine

## 2017-01-15 ENCOUNTER — Encounter: Payer: Self-pay | Admitting: Family Medicine

## 2017-01-15 DIAGNOSIS — J069 Acute upper respiratory infection, unspecified: Secondary | ICD-10-CM

## 2017-01-15 MED ORDER — LORATADINE 10 MG PO TABS: 10.0000 mg | ORAL_TABLET | Freq: Every day | ORAL | 11 refills | Status: DC

## 2017-01-15 MED ORDER — FLUTICASONE PROPIONATE 50 MCG/ACT NA SUSP
2.0000 | Freq: Every day | NASAL | 6 refills | Status: DC
Start: 2017-01-15 — End: 2018-01-19

## 2017-01-15 NOTE — Assessment & Plan Note (Signed)
Patient with prior upper respiratory infection related to influenza and also had an ear infection. Overall she is improved. Still some ear discomfort though no apparent infection on exam. Does continue to have some postnasal drip. Suspect ear pain is related to eustachian tube dysfunction. Also suspect sternocleidomastoid muscle strain given exam and area of tenderness. There are no underlying palpable defects. Discussed Flonase and Claritin for continued slight upper respiratory issues that could be related to allergic rhinitis. Tylenol for discomfort. Given return precautions.

## 2017-01-15 NOTE — Progress Notes (Signed)
Pre visit review using our clinic review tool, if applicable. No additional management support is needed unless otherwise documented below in the visit note. 

## 2017-01-15 NOTE — Progress Notes (Signed)
  Tommi Rumps, MD Phone: [phone]  [name] [name] is a 61 y.o. female who presents today for same-day visit.  Patient notes on 01/01/17 she developed a fever and then 2 days later was evaluated at an urgent care and diagnosed with type A flu. She was treated with Tamiflu. They also diagnosed her with an ear infection. She was treated with azithromycin and prednisone as well. She notes overall she feels better though her left ear still does hurt some. She does note some mild right-sided neck discomfort as well with this. This is over the sternocleidomastoid also. This started earlier today after waking up. She notes no congestion. Still coughing some. Still some postnasal drip. No fevers. No difficulty swallowing.   ROS see history of present illness  Objective  Physical Exam Vitals:   01/15/17 1316 01/15/17 1335  BP: 140/88 124/76  Pulse: 68   Temp: 99.2 F (37.3 C)     BP Readings from Last 3 Encounters:  01/15/17 124/76  09/11/16 128/68  08/28/16 114/68   Wt Readings from Last 3 Encounters:  01/15/17 165 lb 9.6 oz (75.1 kg)  09/11/16 164 lb 8 oz (74.6 kg)  08/28/16 165 lb (74.8 kg)    Physical Exam  Constitutional: No distress.  HENT:  Head: Normocephalic and atraumatic.  Mouth/Throat: Oropharynx is clear and moist. No oropharyngeal exudate.  Normal TMs bilaterally  Eyes: Conjunctivae are normal. Pupils are equal, round, and reactive to light.  Neck: Neck supple.  Patient notes tenderness over the right sternocleidomastoid muscle over the midportion, has some discomfort in this area when she rotates her head to the left, there is no palpable mass or swelling noted in this area, there is no overlying skin changes  Cardiovascular: Normal rate, regular rhythm and normal heart sounds.   Pulmonary/Chest: Effort normal and breath sounds normal.  Lymphadenopathy:    She has no cervical adenopathy.  Neurological: She is alert. Gait normal.  Skin: Skin is warm and dry.  She is not diaphoretic.     Assessment/Plan: Please see individual problem list.  Upper respiratory infection Patient with prior upper respiratory infection related to influenza and also had an ear infection. Overall she is improved. Still some ear discomfort though no apparent infection on exam. Does continue to have some postnasal drip. Suspect ear pain is related to eustachian tube dysfunction. Also suspect sternocleidomastoid muscle strain given exam and area of tenderness. There are no underlying palpable defects. Discussed Flonase and Claritin for continued slight upper respiratory issues that could be related to allergic rhinitis. Tylenol for discomfort. Given return precautions.   No orders of the defined types were placed in this encounter.   Meds ordered this encounter  Medications  . fluticasone (FLONASE) 50 MCG/ACT nasal spray    Sig: Place 2 sprays into both nostrils daily.    Dispense:  16 g    Refill:  6  . loratadine (CLARITIN) 10 MG tablet    Sig: Take 1 tablet (10 mg total) by mouth daily.    Dispense:  30 tablet    Refill:  [place], MD [place]

## 2017-01-15 NOTE — Patient Instructions (Signed)
Nice to see you. We're going to try Flonase and Claritin to see if these will help with your symptoms. You can take Tylenol for any pain. If you develop worsening symptoms, fevers, or any new or changing symptoms please seek medical attention immediately.

## 2017-03-04 DIAGNOSIS — M25562 Pain in left knee: Secondary | ICD-10-CM | POA: Diagnosis not present

## 2017-03-04 DIAGNOSIS — M546 Pain in thoracic spine: Secondary | ICD-10-CM | POA: Diagnosis not present

## 2017-03-04 DIAGNOSIS — M545 Low back pain: Secondary | ICD-10-CM | POA: Diagnosis not present

## 2017-03-04 DIAGNOSIS — M542 Cervicalgia: Secondary | ICD-10-CM | POA: Diagnosis not present

## 2017-03-06 DIAGNOSIS — M25562 Pain in left knee: Secondary | ICD-10-CM | POA: Diagnosis not present

## 2017-03-06 DIAGNOSIS — M545 Low back pain: Secondary | ICD-10-CM | POA: Diagnosis not present

## 2017-03-06 DIAGNOSIS — M542 Cervicalgia: Secondary | ICD-10-CM | POA: Diagnosis not present

## 2017-03-06 DIAGNOSIS — M546 Pain in thoracic spine: Secondary | ICD-10-CM | POA: Diagnosis not present

## 2017-03-10 DIAGNOSIS — M546 Pain in thoracic spine: Secondary | ICD-10-CM | POA: Diagnosis not present

## 2017-03-10 DIAGNOSIS — M545 Low back pain: Secondary | ICD-10-CM | POA: Diagnosis not present

## 2017-03-10 DIAGNOSIS — M25562 Pain in left knee: Secondary | ICD-10-CM | POA: Diagnosis not present

## 2017-03-10 DIAGNOSIS — M542 Cervicalgia: Secondary | ICD-10-CM | POA: Diagnosis not present

## 2017-03-13 DIAGNOSIS — M542 Cervicalgia: Secondary | ICD-10-CM | POA: Diagnosis not present

## 2017-03-13 DIAGNOSIS — M546 Pain in thoracic spine: Secondary | ICD-10-CM | POA: Diagnosis not present

## 2017-03-13 DIAGNOSIS — M545 Low back pain: Secondary | ICD-10-CM | POA: Diagnosis not present

## 2017-03-13 DIAGNOSIS — M25562 Pain in left knee: Secondary | ICD-10-CM | POA: Diagnosis not present

## 2017-03-17 DIAGNOSIS — M25562 Pain in left knee: Secondary | ICD-10-CM | POA: Diagnosis not present

## 2017-03-17 DIAGNOSIS — M545 Low back pain: Secondary | ICD-10-CM | POA: Diagnosis not present

## 2017-03-17 DIAGNOSIS — M546 Pain in thoracic spine: Secondary | ICD-10-CM | POA: Diagnosis not present

## 2017-03-17 DIAGNOSIS — M542 Cervicalgia: Secondary | ICD-10-CM | POA: Diagnosis not present

## 2017-03-20 DIAGNOSIS — M542 Cervicalgia: Secondary | ICD-10-CM | POA: Diagnosis not present

## 2017-03-20 DIAGNOSIS — M546 Pain in thoracic spine: Secondary | ICD-10-CM | POA: Diagnosis not present

## 2017-03-20 DIAGNOSIS — M25562 Pain in left knee: Secondary | ICD-10-CM | POA: Diagnosis not present

## 2017-03-20 DIAGNOSIS — M545 Low back pain: Secondary | ICD-10-CM | POA: Diagnosis not present

## 2017-03-24 DIAGNOSIS — M545 Low back pain: Secondary | ICD-10-CM | POA: Diagnosis not present

## 2017-03-24 DIAGNOSIS — M542 Cervicalgia: Secondary | ICD-10-CM | POA: Diagnosis not present

## 2017-03-24 DIAGNOSIS — M546 Pain in thoracic spine: Secondary | ICD-10-CM | POA: Diagnosis not present

## 2017-03-24 DIAGNOSIS — M25562 Pain in left knee: Secondary | ICD-10-CM | POA: Diagnosis not present

## 2017-03-26 DIAGNOSIS — J0100 Acute maxillary sinusitis, unspecified: Secondary | ICD-10-CM | POA: Diagnosis not present

## 2017-03-27 DIAGNOSIS — M25562 Pain in left knee: Secondary | ICD-10-CM | POA: Diagnosis not present

## 2017-03-27 DIAGNOSIS — M542 Cervicalgia: Secondary | ICD-10-CM | POA: Diagnosis not present

## 2017-03-27 DIAGNOSIS — M546 Pain in thoracic spine: Secondary | ICD-10-CM | POA: Diagnosis not present

## 2017-03-27 DIAGNOSIS — M545 Low back pain: Secondary | ICD-10-CM | POA: Diagnosis not present

## 2017-04-17 DIAGNOSIS — Z01419 Encounter for gynecological examination (general) (routine) without abnormal findings: Secondary | ICD-10-CM | POA: Diagnosis not present

## 2017-04-17 DIAGNOSIS — Z6824 Body mass index (BMI) 24.0-24.9, adult: Secondary | ICD-10-CM | POA: Diagnosis not present

## 2017-04-17 DIAGNOSIS — N644 Mastodynia: Secondary | ICD-10-CM | POA: Diagnosis not present

## 2017-04-17 DIAGNOSIS — Z13820 Encounter for screening for osteoporosis: Secondary | ICD-10-CM | POA: Diagnosis not present

## 2017-04-18 ENCOUNTER — Other Ambulatory Visit: Payer: Self-pay | Admitting: Obstetrics and Gynecology

## 2017-04-18 DIAGNOSIS — N644 Mastodynia: Secondary | ICD-10-CM

## 2017-04-23 ENCOUNTER — Ambulatory Visit
Admission: RE | Admit: 2017-04-23 | Discharge: 2017-04-23 | Disposition: A | Discharge status: Home / Self Care | Payer: BLUE CROSS/BLUE SHIELD | Source: Ambulatory Visit | Attending: Obstetrics and Gynecology | Admitting: Obstetrics and Gynecology

## 2017-04-23 DIAGNOSIS — N644 Mastodynia: Secondary | ICD-10-CM

## 2017-04-23 DIAGNOSIS — R928 Other abnormal and inconclusive findings on diagnostic imaging of breast: Secondary | ICD-10-CM | POA: Diagnosis not present

## 2017-04-23 DIAGNOSIS — N6489 Other specified disorders of breast: Secondary | ICD-10-CM | POA: Diagnosis not present

## 2017-06-28 DIAGNOSIS — J019 Acute sinusitis, unspecified: Secondary | ICD-10-CM | POA: Diagnosis not present

## 2017-06-28 DIAGNOSIS — A932 Colorado tick fever: Secondary | ICD-10-CM | POA: Diagnosis not present

## 2017-08-31 IMAGING — US US BREAST LTD UNI RIGHT INC AXILLA
1 series · 6 of 6 positions shown · non-contrast
Comparison: 04/11/2016 and earlier

CLINICAL DATA: Patient returns after screening study for evaluation
of a possible right breast mass.

EXAM:
2D DIGITAL DIAGNOSTIC RIGHT MAMMOGRAM WITH ADJUNCT TOMO
ULTRASOUND RIGHT BREAST

[Series 1: advbreast · 6 of 6 slices shown]
[im 1/6]
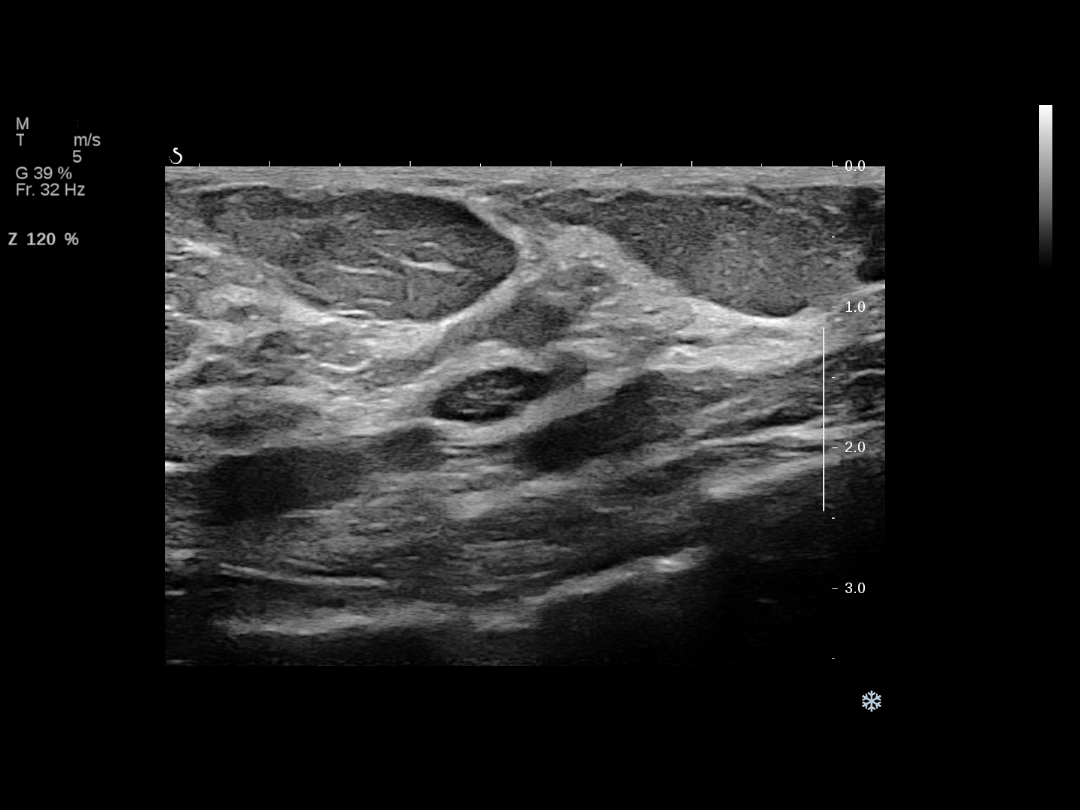
[im 2/6]
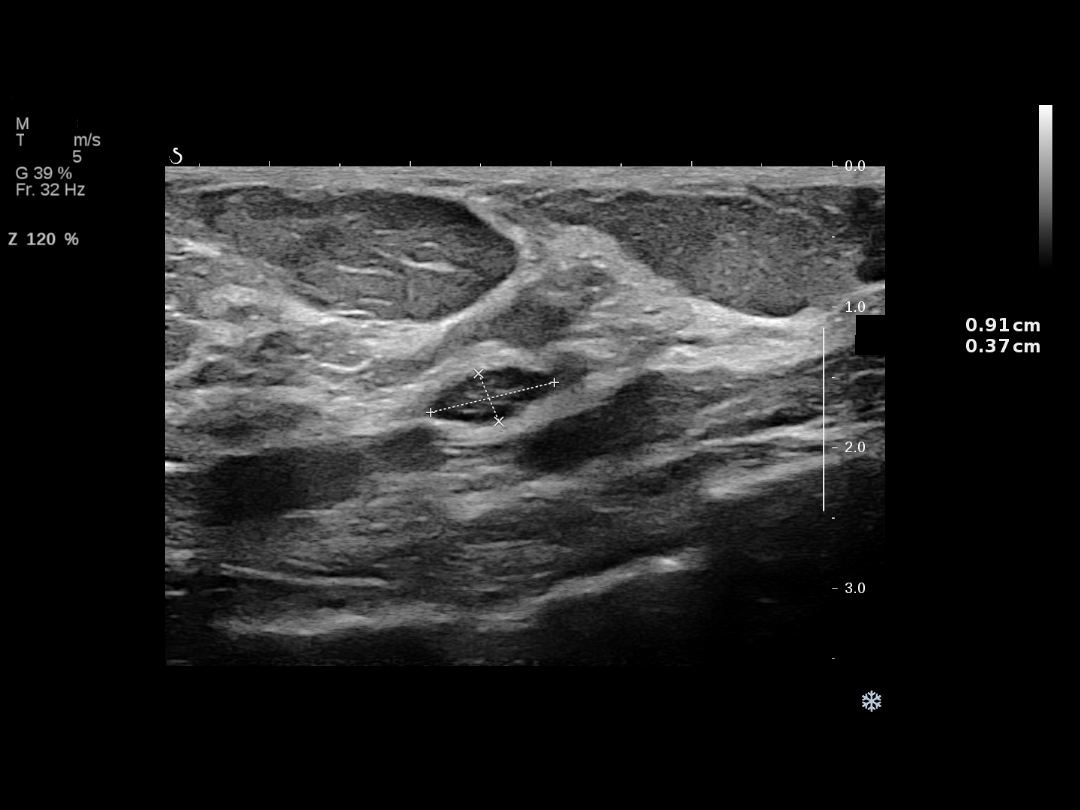
[im 3/6]
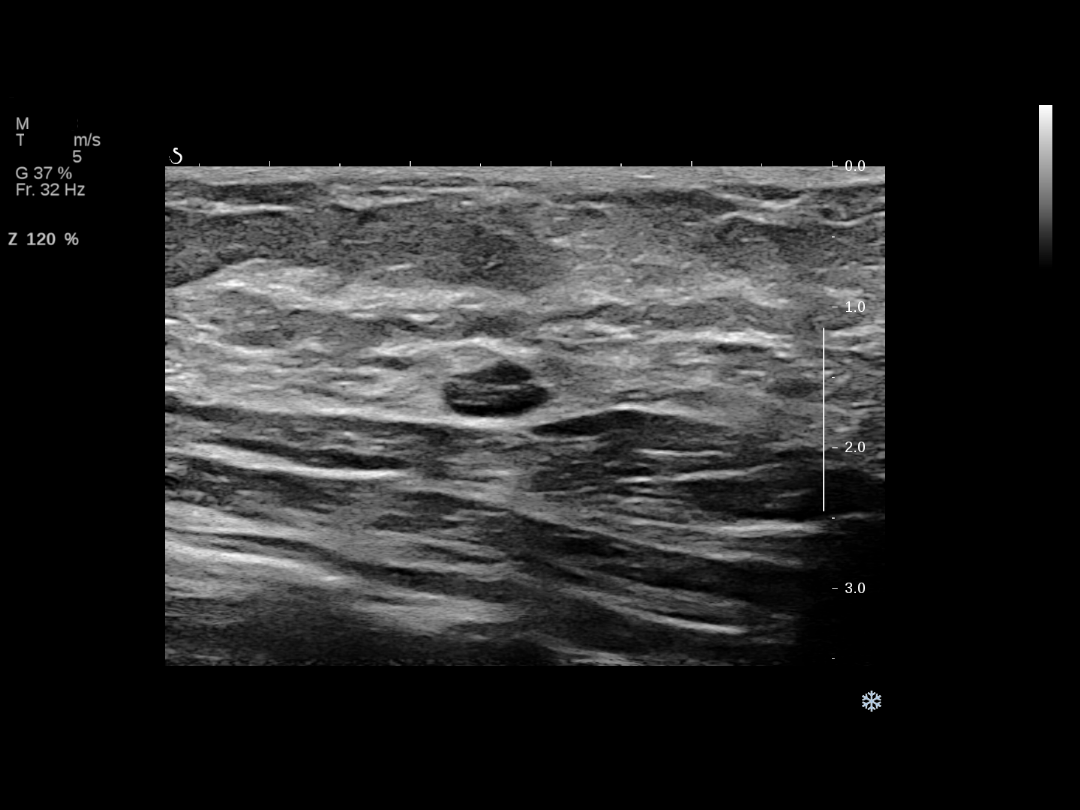
[im 4/6]
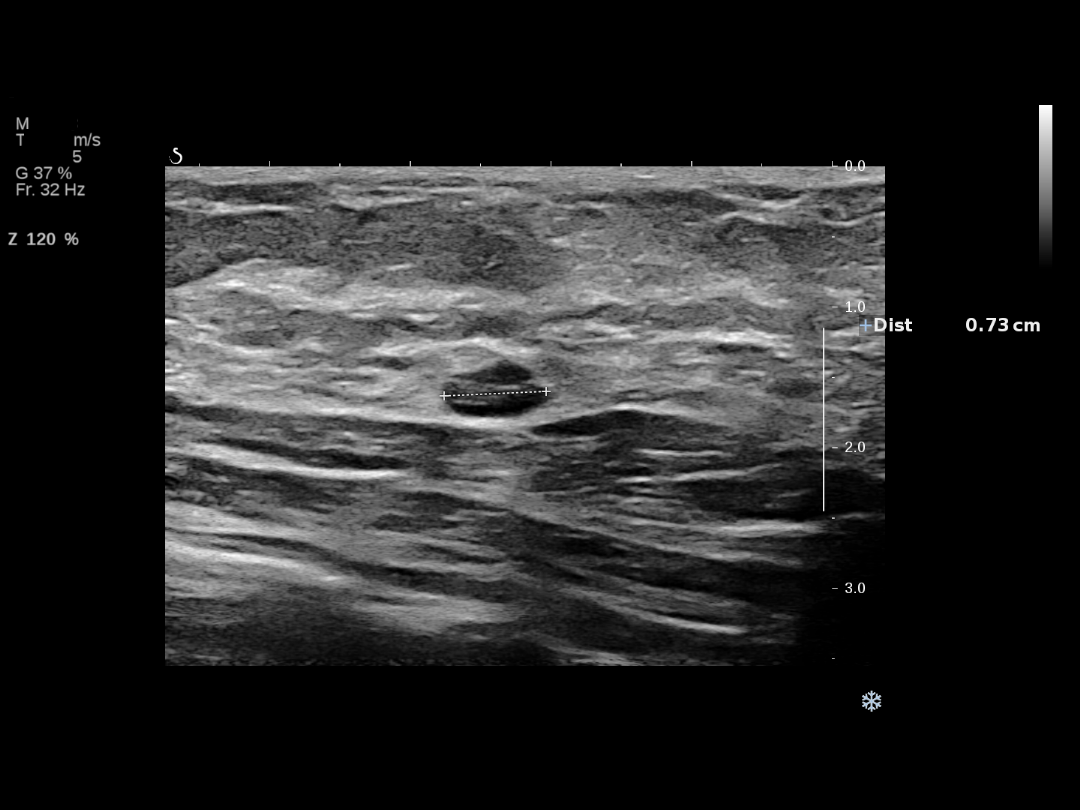
[im 5/6]
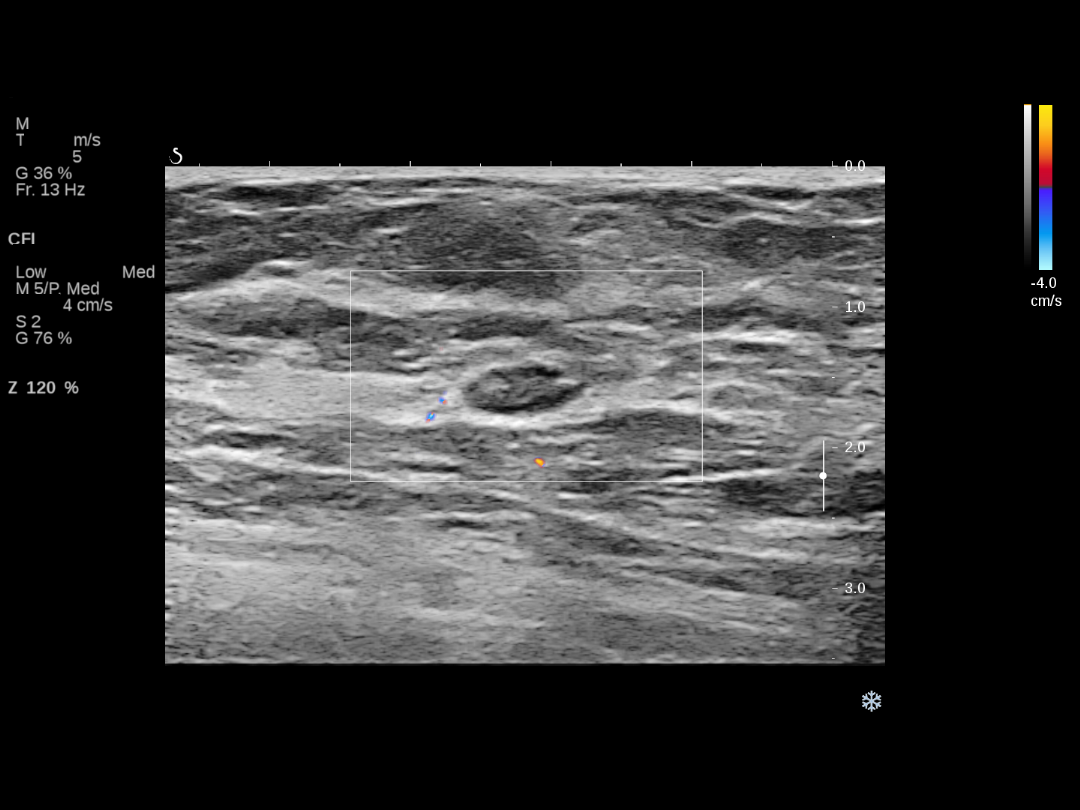
[im 6/6]
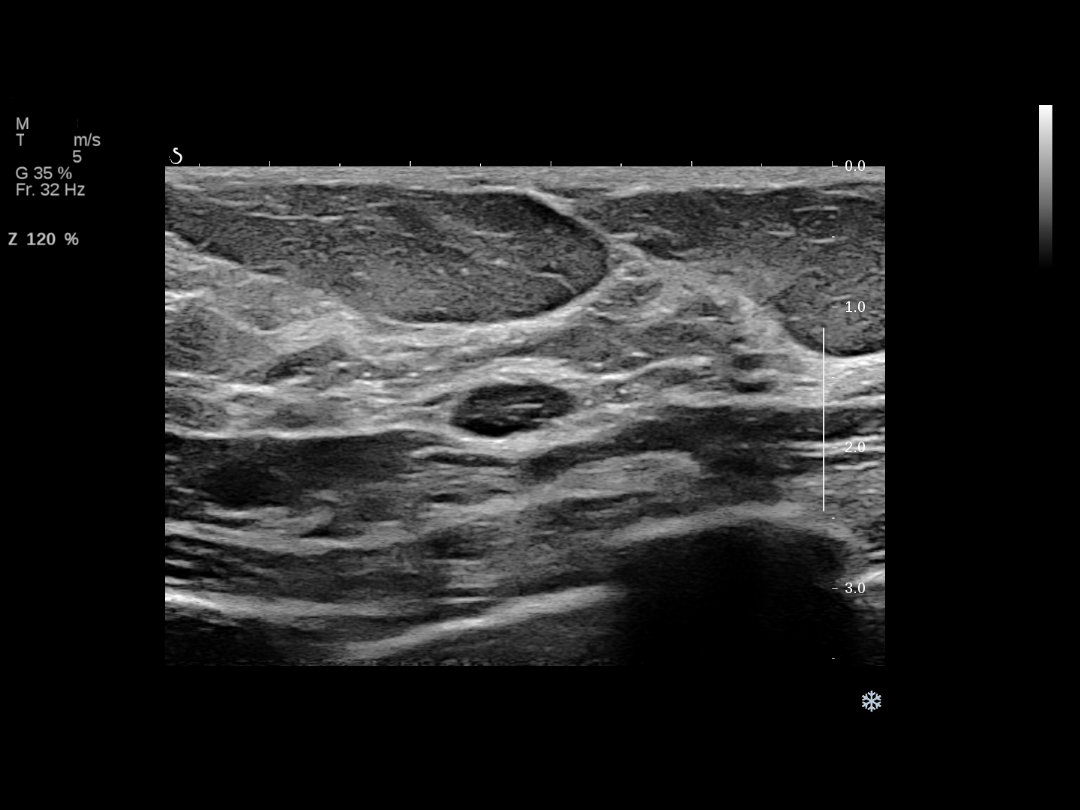

[6 of 6 positions shown; findings below may reference images not displayed]

ACR Breast Density Category b: There are scattered areas of
fibroglandular density.
FINDINGS: Additional views are performed, confirming presence of a
low-attenuation rounded mass in the lateral aspect of the right
breast.

On physical exam, I palpate no abnormality in the lateral quadrants
of the right breast.

Targeted ultrasound is performed, showing a small group of
hypoechoic to anechoic masses in the 9 o'clock location of the right
breast 3 cm from the nipple which measures 0.9 x 0.4 x 0.7 cm. There
is associated increased through transmission. Mass is parallel to
the transducer. Findings are likely related to benign fibrocystic
changes/apocrine metaplasia.
IMPRESSION: Persistent abnormality has a benign appearance, likely representing
fibrocystic changes/apocrine metaplasia. Imaging follow-up is
recommended to document stability.

RECOMMENDATION:
Right breast ultrasound is recommended in 6 months.

I have discussed the findings and recommendations with the patient.
Results were also provided in writing at the conclusion of the
visit. If applicable, a reminder letter will be sent to the patient
regarding the next appointment.

BI-RADS CATEGORY  3: Probably benign.

## 2017-08-31 IMAGING — MG MM DIAG BREAST TOMO UNI RIGHT
4 series · 4 of 12 positions shown · non-contrast
Comparison: 04/11/2016 and earlier

CLINICAL DATA: Patient returns after screening study for evaluation
of a possible right breast mass.

EXAM:
2D DIGITAL DIAGNOSTIC RIGHT MAMMOGRAM WITH ADJUNCT TOMO
ULTRASOUND RIGHT BREAST

[R CC]
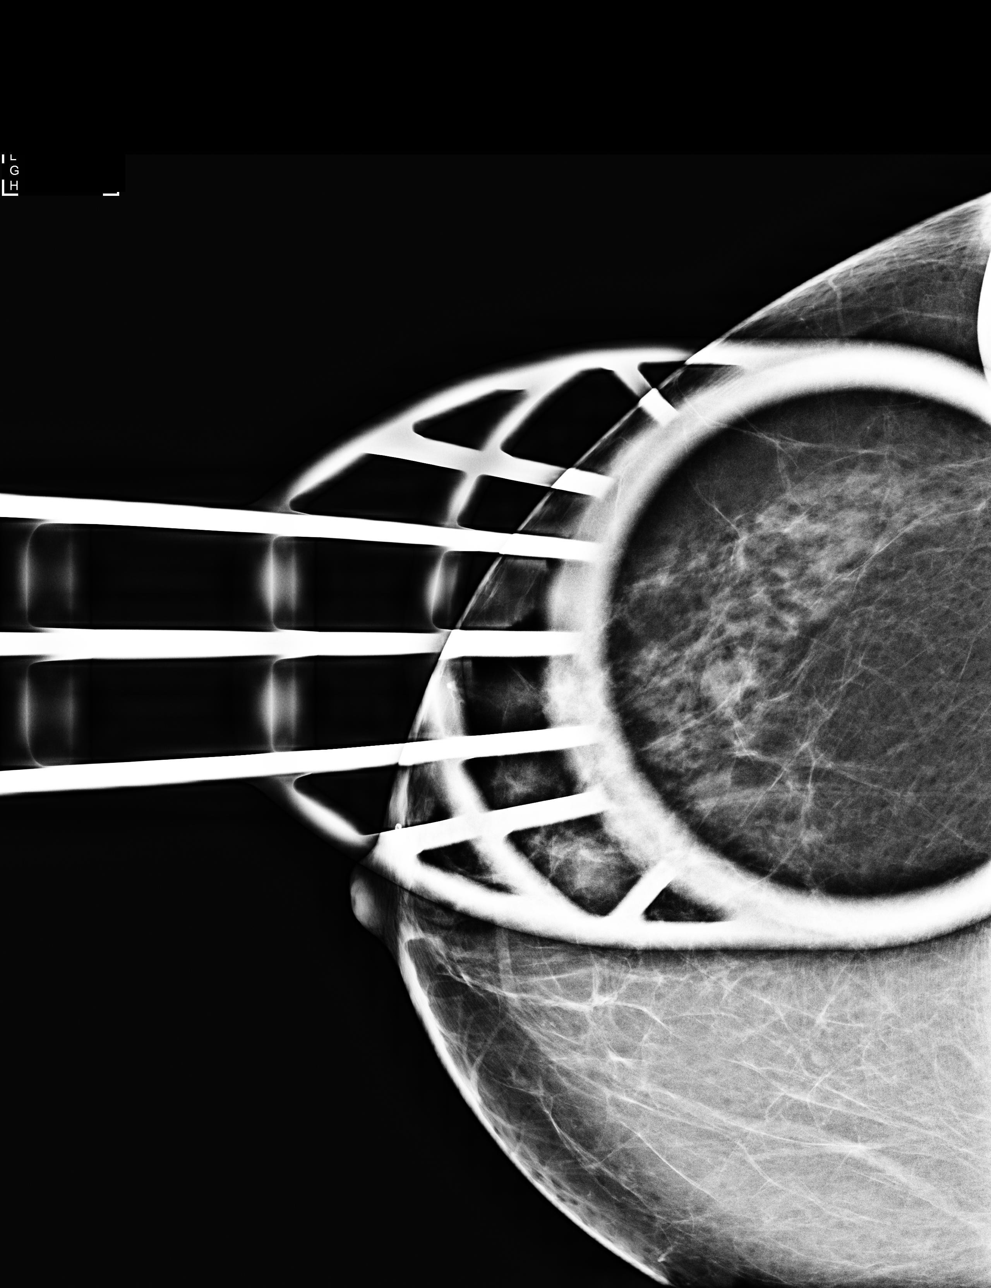

[R MLO]
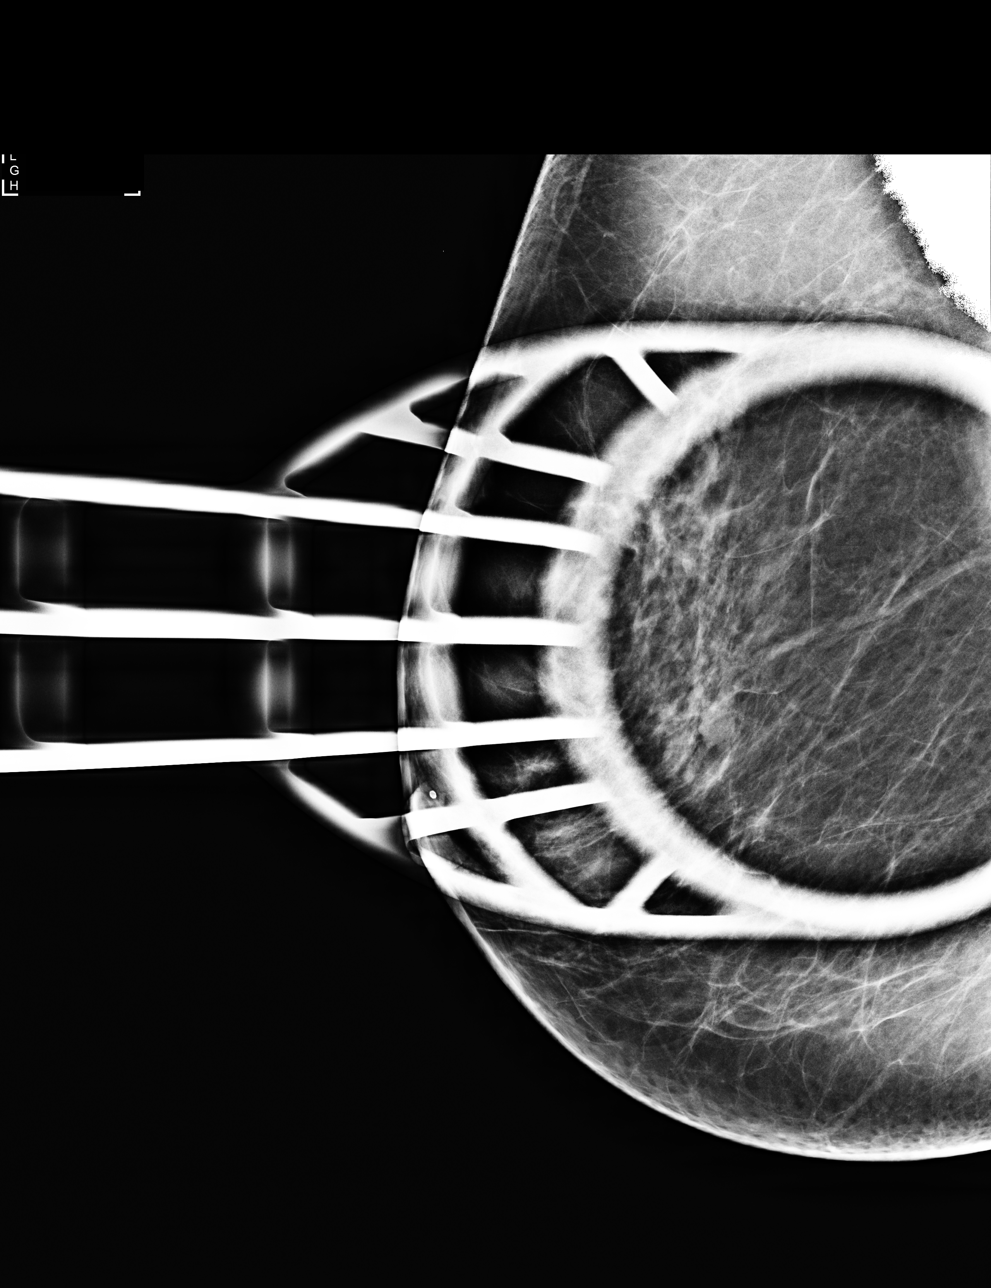

[R CC tomo · tomo slice 29/57.0]
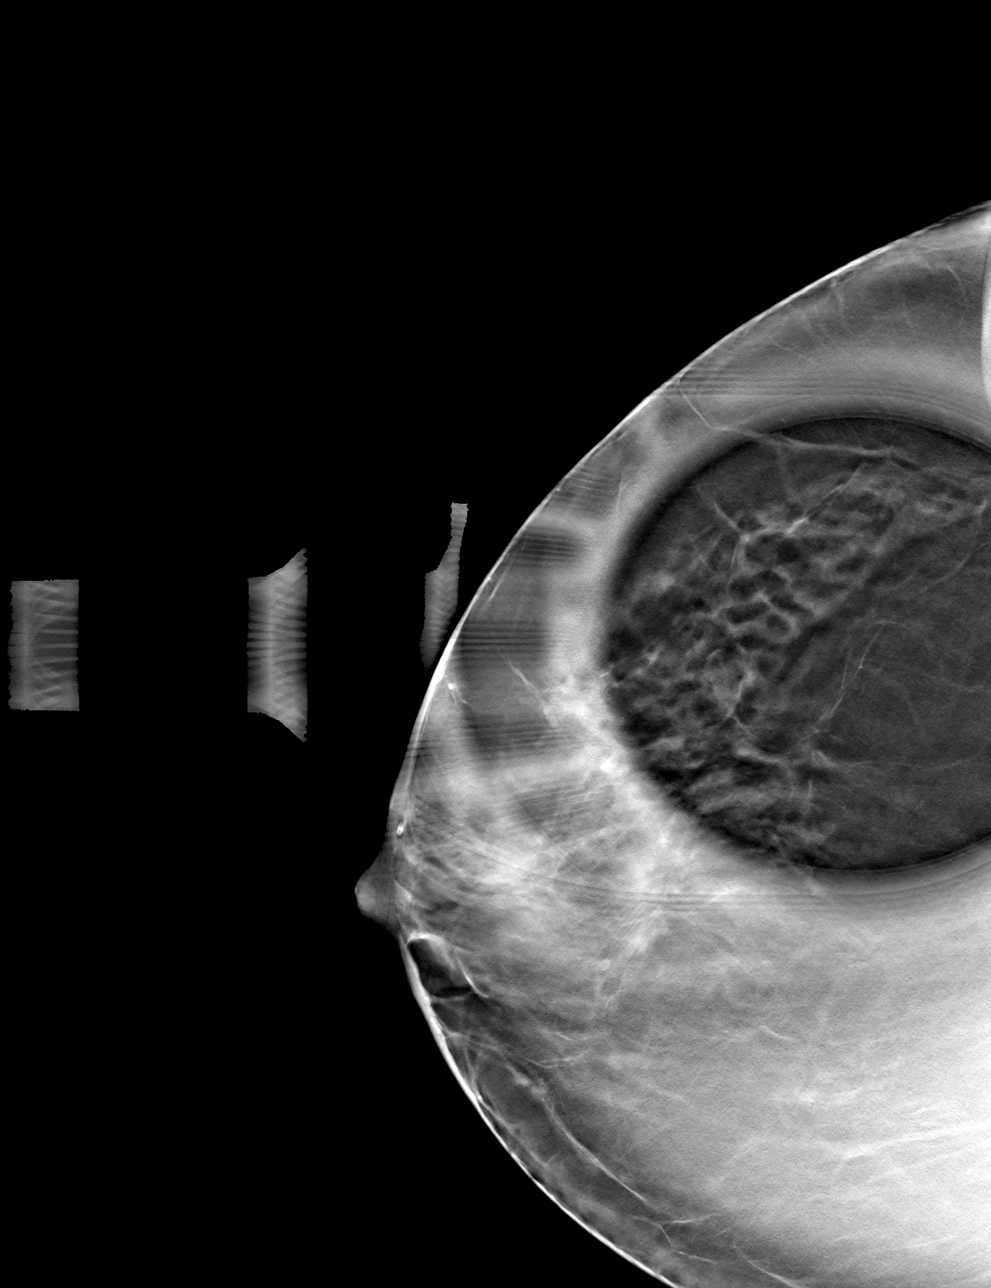

[R MLO tomo · tomo slice 32/63.0]
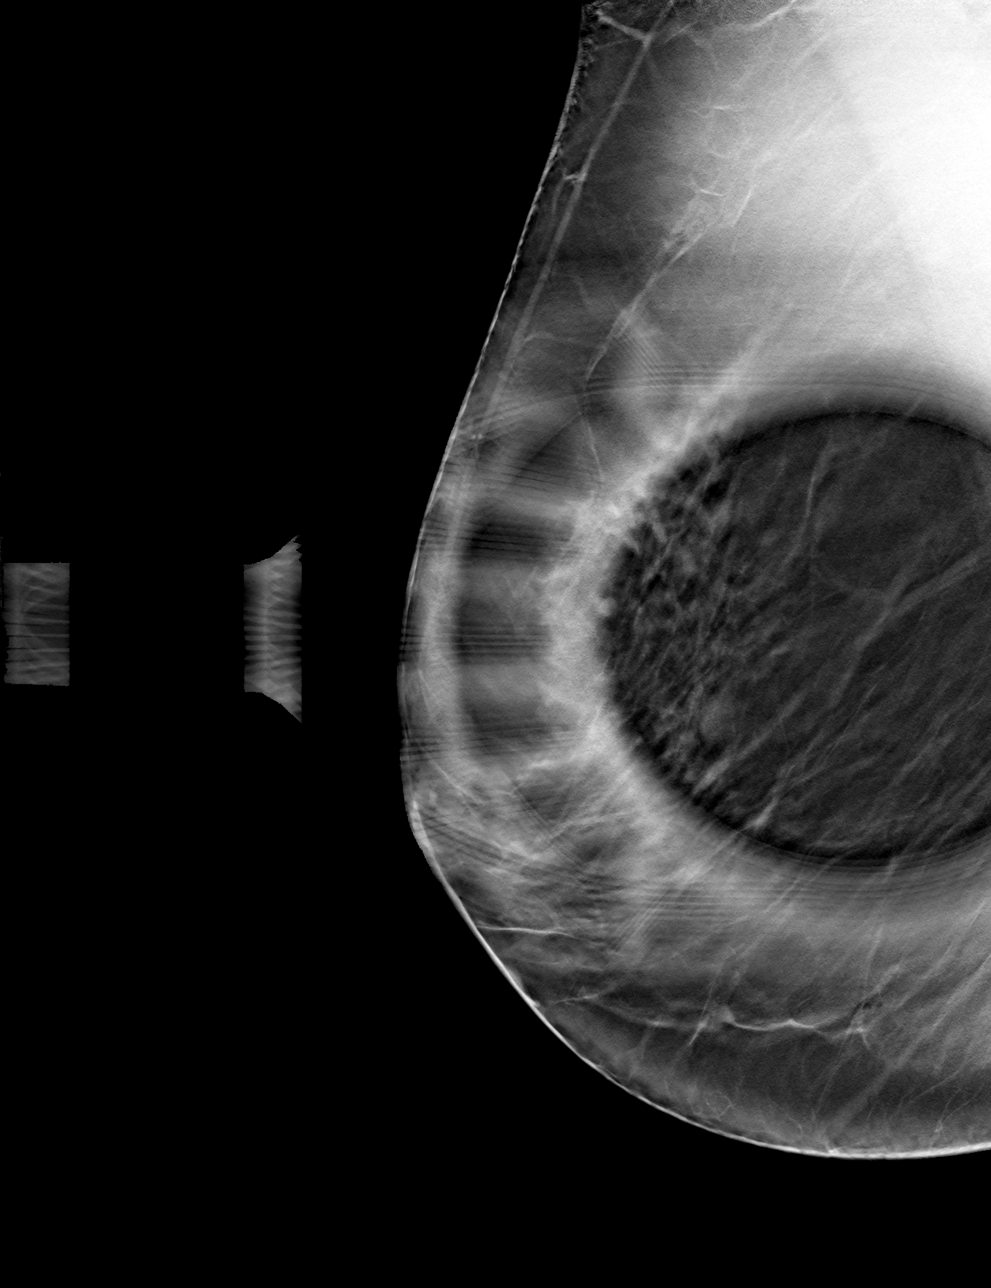

[4 of 12 positions shown; findings below may reference images not displayed]

ACR Breast Density Category b: There are scattered areas of
fibroglandular density.
FINDINGS: Additional views are performed, confirming presence of a
low-attenuation rounded mass in the lateral aspect of the right
breast.

On physical exam, I palpate no abnormality in the lateral quadrants
of the right breast.

Targeted ultrasound is performed, showing a small group of
hypoechoic to anechoic masses in the 9 o'clock location of the right
breast 3 cm from the nipple which measures 0.9 x 0.4 x 0.7 cm. There
is associated increased through transmission. Mass is parallel to
the transducer. Findings are likely related to benign fibrocystic
changes/apocrine metaplasia.
IMPRESSION: Persistent abnormality has a benign appearance, likely representing
fibrocystic changes/apocrine metaplasia. Imaging follow-up is
recommended to document stability.

RECOMMENDATION:
Right breast ultrasound is recommended in 6 months.

I have discussed the findings and recommendations with the patient.
Results were also provided in writing at the conclusion of the
visit. If applicable, a reminder letter will be sent to the patient
regarding the next appointment.

BI-RADS CATEGORY  3: Probably benign.

## 2017-10-02 DIAGNOSIS — M5412 Radiculopathy, cervical region: Secondary | ICD-10-CM | POA: Diagnosis not present

## 2017-10-02 DIAGNOSIS — M9901 Segmental and somatic dysfunction of cervical region: Secondary | ICD-10-CM | POA: Diagnosis not present

## 2017-10-02 DIAGNOSIS — M9902 Segmental and somatic dysfunction of thoracic region: Secondary | ICD-10-CM | POA: Diagnosis not present

## 2017-10-02 DIAGNOSIS — M5384 Other specified dorsopathies, thoracic region: Secondary | ICD-10-CM | POA: Diagnosis not present

## 2017-10-06 DIAGNOSIS — M9902 Segmental and somatic dysfunction of thoracic region: Secondary | ICD-10-CM | POA: Diagnosis not present

## 2017-10-06 DIAGNOSIS — M9901 Segmental and somatic dysfunction of cervical region: Secondary | ICD-10-CM | POA: Diagnosis not present

## 2017-10-06 DIAGNOSIS — M5384 Other specified dorsopathies, thoracic region: Secondary | ICD-10-CM | POA: Diagnosis not present

## 2017-10-06 DIAGNOSIS — M5412 Radiculopathy, cervical region: Secondary | ICD-10-CM | POA: Diagnosis not present

## 2017-10-07 ENCOUNTER — Ambulatory Visit: Payer: BLUE CROSS/BLUE SHIELD | Admitting: Internal Medicine

## 2017-10-09 DIAGNOSIS — M9902 Segmental and somatic dysfunction of thoracic region: Secondary | ICD-10-CM | POA: Diagnosis not present

## 2017-10-09 DIAGNOSIS — M9901 Segmental and somatic dysfunction of cervical region: Secondary | ICD-10-CM | POA: Diagnosis not present

## 2017-10-09 DIAGNOSIS — M5384 Other specified dorsopathies, thoracic region: Secondary | ICD-10-CM | POA: Diagnosis not present

## 2017-10-09 DIAGNOSIS — M5412 Radiculopathy, cervical region: Secondary | ICD-10-CM | POA: Diagnosis not present

## 2017-10-13 DIAGNOSIS — M9901 Segmental and somatic dysfunction of cervical region: Secondary | ICD-10-CM | POA: Diagnosis not present

## 2017-10-13 DIAGNOSIS — M9902 Segmental and somatic dysfunction of thoracic region: Secondary | ICD-10-CM | POA: Diagnosis not present

## 2017-10-13 DIAGNOSIS — M5412 Radiculopathy, cervical region: Secondary | ICD-10-CM | POA: Diagnosis not present

## 2017-10-13 DIAGNOSIS — M5384 Other specified dorsopathies, thoracic region: Secondary | ICD-10-CM | POA: Diagnosis not present

## 2017-10-16 DIAGNOSIS — M5412 Radiculopathy, cervical region: Secondary | ICD-10-CM | POA: Diagnosis not present

## 2017-10-16 DIAGNOSIS — M5384 Other specified dorsopathies, thoracic region: Secondary | ICD-10-CM | POA: Diagnosis not present

## 2017-10-16 DIAGNOSIS — M9902 Segmental and somatic dysfunction of thoracic region: Secondary | ICD-10-CM | POA: Diagnosis not present

## 2017-10-16 DIAGNOSIS — M9901 Segmental and somatic dysfunction of cervical region: Secondary | ICD-10-CM | POA: Diagnosis not present

## 2017-10-23 DIAGNOSIS — M9902 Segmental and somatic dysfunction of thoracic region: Secondary | ICD-10-CM | POA: Diagnosis not present

## 2017-10-23 DIAGNOSIS — M5384 Other specified dorsopathies, thoracic region: Secondary | ICD-10-CM | POA: Diagnosis not present

## 2017-10-23 DIAGNOSIS — M5412 Radiculopathy, cervical region: Secondary | ICD-10-CM | POA: Diagnosis not present

## 2017-10-23 DIAGNOSIS — M9901 Segmental and somatic dysfunction of cervical region: Secondary | ICD-10-CM | POA: Diagnosis not present

## 2017-11-04 DIAGNOSIS — M9901 Segmental and somatic dysfunction of cervical region: Secondary | ICD-10-CM | POA: Diagnosis not present

## 2017-11-04 DIAGNOSIS — M5384 Other specified dorsopathies, thoracic region: Secondary | ICD-10-CM | POA: Diagnosis not present

## 2017-11-04 DIAGNOSIS — M5412 Radiculopathy, cervical region: Secondary | ICD-10-CM | POA: Diagnosis not present

## 2017-11-04 DIAGNOSIS — M9902 Segmental and somatic dysfunction of thoracic region: Secondary | ICD-10-CM | POA: Diagnosis not present

## 2017-11-13 DIAGNOSIS — J0100 Acute maxillary sinusitis, unspecified: Secondary | ICD-10-CM | POA: Diagnosis not present

## 2017-11-18 ENCOUNTER — Encounter: Payer: BLUE CROSS/BLUE SHIELD | Admitting: Family Medicine

## 2017-11-19 ENCOUNTER — Ambulatory Visit: Payer: BLUE CROSS/BLUE SHIELD | Admitting: Family Medicine

## 2017-11-19 ENCOUNTER — Encounter: Payer: Self-pay | Admitting: Family Medicine

## 2017-11-19 VITALS — BP 148/72 | HR 67 | Temp 97.5°F | Ht 68.0 in | Wt 165.6 lb

## 2017-11-19 DIAGNOSIS — Z01419 Encounter for gynecological examination (general) (routine) without abnormal findings: Secondary | ICD-10-CM | POA: Diagnosis not present

## 2017-11-19 DIAGNOSIS — Z23 Encounter for immunization: Secondary | ICD-10-CM

## 2017-11-19 DIAGNOSIS — I10 Essential (primary) hypertension: Secondary | ICD-10-CM | POA: Diagnosis not present

## 2017-11-19 DIAGNOSIS — E785 Hyperlipidemia, unspecified: Secondary | ICD-10-CM

## 2017-11-19 LAB — CBC WITH DIFFERENTIAL/PLATELET
Basophils Absolute: 0.1 10*3/uL (ref 0.0–0.1)
Basophils Relative: 0.7 % (ref 0.0–3.0)
Eosinophils Absolute: 0.3 10*3/uL (ref 0.0–0.7)
Eosinophils Relative: 3 % (ref 0.0–5.0)
HCT: 43.8 % (ref 36.0–46.0)
Hemoglobin: 14.2 g/dL (ref 12.0–15.0)
Lymphocytes Relative: 28.4 % (ref 12.0–46.0)
Lymphs Abs: 2.9 10*3/uL (ref 0.7–4.0)
MCHC: 32.4 g/dL (ref 30.0–36.0)
MCV: 95.3 fl (ref 78.0–100.0)
Monocytes Absolute: 0.6 10*3/uL (ref 0.1–1.0)
Monocytes Relative: 6.2 % (ref 3.0–12.0)
Neutro Abs: 6.3 10*3/uL (ref 1.4–7.7)
Neutrophils Relative %: 61.7 % (ref 43.0–77.0)
Platelets: 347 10*3/uL (ref 150.0–400.0)
RBC: 4.6 Mil/uL (ref 3.87–5.11)
RDW: 13.5 % (ref 11.5–15.5)
WBC: 10.1 10*3/uL (ref 4.0–10.5)

## 2017-11-19 LAB — LIPID PANEL
Cholesterol: 205 mg/dL — ABNORMAL HIGH (ref 0–200)
HDL: 46.3 mg/dL (ref >39.00)
LDL Cholesterol: 121 mg/dL — ABNORMAL HIGH (ref 0–99)
NonHDL: 158.37
Total CHOL/HDL Ratio: 4
Triglycerides: 188 mg/dL — ABNORMAL HIGH (ref 0.0–149.0)
VLDL: 37.6 mg/dL (ref 0.0–40.0)

## 2017-11-19 LAB — COMPREHENSIVE METABOLIC PANEL
ALT: 18 U/L (ref 0–35)
AST: 14 U/L (ref 0–37)
Albumin: 4.5 g/dL (ref 3.5–5.2)
Alkaline Phosphatase: 74 U/L (ref 39–117)
BUN: 20 mg/dL (ref 6–23)
CO2: 31 mEq/L (ref 19–32)
Calcium: 10 mg/dL (ref 8.4–10.5)
Chloride: 106 mEq/L (ref 96–112)
Creatinine, Ser: 0.72 mg/dL (ref 0.40–1.20)
GFR: 87.85 mL/min (ref >60.00)
Glucose, Bld: 95 mg/dL (ref 70–99)
Potassium: 5.3 mEq/L — ABNORMAL HIGH (ref 3.5–5.1)
Sodium: 145 mEq/L (ref 135–145)
Total Bilirubin: 0.5 mg/dL (ref 0.2–1.2)
Total Protein: 6.7 g/dL (ref 6.0–8.3)

## 2017-11-19 LAB — TSH: TSH: 1.95 u[IU]/mL (ref 0.35–4.50)

## 2017-11-19 MED ORDER — ALPRAZOLAM 0.25 MG PO TABS: 0.2500 mg | ORAL_TABLET | Freq: Every day | ORAL | 2 refills | Status: DC | PRN

## 2017-11-19 NOTE — Patient Instructions (Signed)
Great to see you. Happy holidays!  We will call you with your lab results and you view them online.

## 2017-11-19 NOTE — Progress Notes (Signed)
Subjective:   Patient ID: [name] [name], female    DOB: 08/18/1956, 61 y.o.   MRN: [id number]  [name] [name] is a pleasant 61 y.o. year old female who presents to clinic today with Annual Exam (Patient is here today for a CPE.  She is currently fasting.  She went to an urgent care and was Dx with Sinusitus.  She finished Z-Pak on Monday.  Would like to get Tdap & Flu vaccines if you feel she is ok to get them.) and follow up of chronic medical conditions on 11/19/2017  HPI:  Recently treated for sinusitis at [facility]- zpack.  Colonoscopy 10/14/12- [facility] - sees Dr. Matthew Saras.   Last saw him 6 months ago.  Remote h/o hysterectomy, then had BSO in 04/2014. Not taking any HRT but she feels she coping ok- no hot flashes.  Tobacco abuse- not ready to quit.    Fibromylagia- was followed by Dr. Estanislado Pandy. Has not seen her for a long time.  Does not like taking medications but she is taking some supplements that she suggested- Magnesium, calcium.  HTN- well controlled.  No longer taking HCTZ.  Had hypotension with it. Denies HA, blurred vision, CP or SOB.  HLD- was prescribed a statin but never started it. She was afraid it would worsen her myalgias.  Lab Results  Component Value Date   CHOL 219 (H) 02/23/2016   HDL 37.60 (L) 02/23/2016   LDLCALC 149 (H) 02/23/2016   TRIG 164.0 (H) 02/23/2016   CHOLHDL 6 02/23/2016     Current Outpatient Medications on File Prior to Visit  Medication Sig Dispense Refill  . calcium carbonate (OS-CAL) 600 MG TABS Take 1 tablet by mouth 2 (two) times daily.    . Cholecalciferol (VITAMIN D3) 2000 UNITS TABS Take 1 capsule by mouth daily.    . fluticasone (FLONASE) 50 MCG/ACT nasal spray Place 2 sprays into both nostrils daily. 16 g 6  . loratadine (CLARITIN) 10 MG tablet Take 1 tablet (10 mg total) by mouth daily. 30 tablet 11  . Magnesium 250 MG TABS Take 1 tablet by mouth daily.    . vitamin E 400 UNIT capsule Take 400 Units by mouth daily.      No current facility-administered medications on file prior to visit.     Allergies  Allergen Reactions  . Hydrocodone Rash  . Demerol [Meperidine] Nausea And Vomiting    severe  . Sulfa Antibiotics Swelling and Rash    Past Medical History:  Diagnosis Date  . Anxiety   . Arthritis    HANDS,  KNEES  . Bone tumor (benign)    RIGHT FEMUR  . Borderline hypertension   . Female pelvic pain   . Fibromyalgia   . Left knee injury    TENDONDINITIS/  CHONDRAMALIA  . PONV (postoperative nausea and vomiting)     Past Surgical History:  Procedure Laterality Date  . DILATION AND CURETTAGE OF UTERUS  1986  &  1988   RETAINED PLACENTA  . LAPAROSCOPIC ASSISTED VAGINAL HYSTERECTOMY  01-25-2003  . LAPAROSCOPY N/A 04/18/2014   Procedure: LAPAROSCOPY DIAGNOSTIC;  Surgeon: Margarette Asal, MD;  Location: [facility];  Service: Gynecology;  Laterality: N/A;  . NASAL SEPTUM SURGERY  AGE 73  . SALPINGOOPHORECTOMY Bilateral 04/18/2014   Procedure: SALPINGO OOPHORECTOMY;  Surgeon: Margarette Asal, MD;  Location: [facility];  Service: Gynecology;  Laterality: Bilateral;  . TONSILLECTOMY  AGE 72  . TUBAL LIGATION  Family History  Problem Relation Age of Onset  . Colon cancer Neg Hx   . Stomach cancer Neg Hx     Social History   Socioeconomic History  . Marital status: Married    Spouse name: Not on file  . Number of children: Not on file  . Years of education: Not on file  . Highest education level: Not on file  Social Needs  . Financial resource strain: Not on file  . Food insecurity - worry: Not on file  . Food insecurity - inability: Not on file  . Transportation needs - medical: Not on file  . Transportation needs - non-medical: Not on file  Occupational History  . Not on file  Tobacco Use  . Smoking status: Current Every Day Smoker    Packs/day: 0.50    Years: 30.00    Pack years: 15.00    Types: Cigarettes  . Smokeless tobacco: Never  Used  Substance and Sexual Activity  . Alcohol use: Yes    Comment: rare  . Drug use: No  . Sexual activity: Not on file  Other Topics Concern  . Not on file  Social History Narrative  . Not on file   The PMH, PSH, Social History, Family History, Medications, and allergies have been reviewed in [facility], and have been updated if relevant.  Review of Systems  Constitutional: Positive for fatigue. Negative for fever and unexpected weight change.  HENT: Negative.   Respiratory: Negative.   Cardiovascular: Negative.   Gastrointestinal: Negative.   Endocrine: Negative.   Genitourinary: Negative.   Musculoskeletal: Positive for arthralgias and myalgias.  Skin: Negative.   Allergic/Immunologic: Negative.   Neurological: Negative.   Hematological: Negative.   Psychiatric/Behavioral: Negative.        Objective:    BP (!) 148/72 (BP Location: Left Arm, Patient Position: Sitting, Cuff Size: Normal)   Pulse 67   Temp (!) 97.5 F (36.4 C) (Oral)   Ht 5\' 8"  (1.727 m)   Wt 165 lb 9.6 oz (75.1 kg)   SpO2 97%   BMI 25.18 kg/m    Physical Exam   General:  Well-developed,well-nourished,in no acute distress; alert,appropriate and cooperative throughout examination Head:  normocephalic and atraumatic.   Eyes:  vision grossly intact, pupils equal, pupils round, and pupils reactive to light.   Ears:  R ear normal and L ear normal.   Nose:  no external deformity.   Mouth:  good dentition.   Neck:  No deformities, masses, or tenderness noted. Breasts:  Deferred- has GYN Lungs:  Normal respiratory effort, chest expands symmetrically. Lungs are clear to auscultation, no crackles or wheezes. Heart:  Normal rate and regular rhythm. S1 and S2 normal without gallop, murmur, click, rub or other extra sounds. Abdomen:  Bowel sounds positive,abdomen soft and non-tender without masses, organomegaly or hernias noted. Genitalia: deferred- has GYN Msk:  No deformity or scoliosis noted of thoracic or  lumbar spine.   Extremities:  No clubbing, cyanosis, edema, or deformity noted with normal full range of motion of all joints.   Neurologic:  alert & oriented X3 and gait normal.   Skin:  Intact without suspicious lesions or rashes Cervical Nodes:  No lymphadenopathy noted Axillary Nodes:  No palpable lymphadenopathy Psych:  Cognition and judgment appear intact. Alert and cooperative with normal attention span and concentration. No apparent delusions, illusions, hallucinations       Assessment & Plan:   Well woman exam - Plan: CBC with Differential/Platelet, TSH  Essential  hypertension  Hyperlipidemia, unspecified hyperlipidemia type - Plan: Comprehensive metabolic panel, Lipid panel No Follow-up on file.

## 2017-11-19 NOTE — Assessment & Plan Note (Signed)
Due for labs today. 

## 2017-11-19 NOTE — Assessment & Plan Note (Signed)
Reviewed preventive care protocols, scheduled due services, and updated immunizations Discussed nutrition, exercise, diet, and healthy lifestyle.  Orders Placed This Encounter  Procedures  . CBC with Differential/Platelet  . Comprehensive metabolic panel  . Lipid panel  . TSH     

## 2017-11-19 NOTE — Assessment & Plan Note (Signed)
Normotensive. No changes made today.

## 2017-11-20 DIAGNOSIS — M9901 Segmental and somatic dysfunction of cervical region: Secondary | ICD-10-CM | POA: Diagnosis not present

## 2017-11-20 DIAGNOSIS — M9902 Segmental and somatic dysfunction of thoracic region: Secondary | ICD-10-CM | POA: Diagnosis not present

## 2017-11-20 DIAGNOSIS — M5412 Radiculopathy, cervical region: Secondary | ICD-10-CM | POA: Diagnosis not present

## 2017-11-20 DIAGNOSIS — M5384 Other specified dorsopathies, thoracic region: Secondary | ICD-10-CM | POA: Diagnosis not present

## 2017-12-02 DIAGNOSIS — M5412 Radiculopathy, cervical region: Secondary | ICD-10-CM | POA: Diagnosis not present

## 2017-12-02 DIAGNOSIS — M9901 Segmental and somatic dysfunction of cervical region: Secondary | ICD-10-CM | POA: Diagnosis not present

## 2017-12-02 DIAGNOSIS — M9902 Segmental and somatic dysfunction of thoracic region: Secondary | ICD-10-CM | POA: Diagnosis not present

## 2017-12-02 DIAGNOSIS — M5384 Other specified dorsopathies, thoracic region: Secondary | ICD-10-CM | POA: Diagnosis not present

## 2018-01-19 ENCOUNTER — Encounter: Payer: Self-pay | Admitting: Family Medicine

## 2018-01-19 ENCOUNTER — Ambulatory Visit: Payer: BLUE CROSS/BLUE SHIELD | Admitting: Family Medicine

## 2018-01-19 VITALS — BP 162/90 | HR 67 | Temp 97.9°F | Ht 68.0 in | Wt 167.4 lb

## 2018-01-19 DIAGNOSIS — R030 Elevated blood-pressure reading, without diagnosis of hypertension: Secondary | ICD-10-CM | POA: Diagnosis not present

## 2018-01-19 DIAGNOSIS — J0100 Acute maxillary sinusitis, unspecified: Secondary | ICD-10-CM

## 2018-01-19 MED ORDER — AMOXICILLIN 500 MG PO CAPS: 500.0000 mg | ORAL_CAPSULE | Freq: Three times a day (TID) | ORAL | 0 refills | Status: DC

## 2018-01-19 MED ORDER — FLUTICASONE PROPIONATE 50 MCG/ACT NA SUSP: 2.0000 | Freq: Every day | NASAL | 6 refills | Status: DC

## 2018-01-19 NOTE — Progress Notes (Addendum)
Subjective:  Patient ID: [name] [name], female    DOB: June 12, 1956  Age: 62 y.o. MRN: [id number]  CC: Sinusitis   HPI KENADI MILTNER presents for URI with 10 days of symptoms.  She has had nasal congestion postnasal drip, purulent rhinorrhea, facial pressure in her cheekbones and frontal area as well as upper teeth pain.  Denies fevers chills or much of a cough.  She uses her Flonase intermittently.  She has been taking cold medicine with phenylephrine in it.  She had 3 doses of this yesterday.  Past medical history of high blood pressure that has resolved with weight loss.  Outpatient Medications Prior to Visit  Medication Sig Dispense Refill  . ALPRAZolam (XANAX) 0.25 MG tablet Take 1 tablet (0.25 mg total) by mouth daily as needed. 30 tablet 2  . calcium carbonate (OS-CAL) 600 MG TABS Take 1 tablet by mouth 2 (two) times daily.    . Cholecalciferol (VITAMIN D3) 2000 UNITS TABS Take 1 capsule by mouth daily.    Marland Kitchen loratadine (CLARITIN) 10 MG tablet Take 1 tablet (10 mg total) by mouth daily. 30 tablet 11  . Magnesium 250 MG TABS Take 1 tablet by mouth daily.    . vitamin E 400 UNIT capsule Take 400 Units by mouth daily.    . fluticasone (FLONASE) 50 MCG/ACT nasal spray Place 2 sprays into both nostrils daily. 16 g 6   No facility-administered medications prior to visit.     ROS Review of Systems  Constitutional: Negative for chills, fatigue and fever.  HENT: Positive for congestion, dental problem, facial swelling, postnasal drip, rhinorrhea, sinus pressure and sinus pain. Negative for ear pain, hearing loss and sore throat.   Eyes: Negative for photophobia and visual disturbance.  Respiratory: Positive for cough. Negative for shortness of breath and wheezing.   Cardiovascular: Negative.   Gastrointestinal: Negative.   Musculoskeletal: Negative for arthralgias and myalgias.  Skin: Negative for rash.  Neurological: Negative for weakness and headaches.  Hematological: Does not  bruise/bleed easily.  Psychiatric/Behavioral: Negative.     Objective:  BP (!) 162/90 (BP Location: Left Arm, Patient Position: Sitting, Cuff Size: Normal)   Pulse 67   Temp 97.9 F (36.6 C) (Oral)   Ht 5\' 8"  (1.727 m)   Wt 167 lb 6 oz (75.9 kg)   SpO2 99%   BMI 25.45 kg/m   BP Readings from Last 3 Encounters:  01/19/18 (!) 162/90  11/19/17 (!) 148/72  01/15/17 124/76    Wt Readings from Last 3 Encounters:  01/19/18 167 lb 6 oz (75.9 kg)  11/19/17 165 lb 9.6 oz (75.1 kg)  01/15/17 165 lb 9.6 oz (75.1 kg)    Physical Exam  Constitutional: She is oriented to person, place, and time. She appears well-developed and well-nourished. No distress.  HENT:  Head: Normocephalic and atraumatic.    Right Ear: External ear normal.  Left Ear: External ear normal.  Mouth/Throat: Oropharynx is clear and moist. No oropharyngeal exudate.  Eyes: Conjunctivae are normal. Pupils are equal, round, and reactive to light. Right eye exhibits no discharge. Left eye exhibits no discharge.  Neck: Neck supple. No JVD present. No tracheal deviation present. No thyromegaly present.  Cardiovascular: Normal rate, regular rhythm and normal heart sounds.  Pulmonary/Chest: Effort normal and breath sounds normal.  Lymphadenopathy:    She has no cervical adenopathy.  Neurological: She is alert and oriented to person, place, and time.  Skin: Skin is warm and dry. She is not diaphoretic.  Psychiatric: She has a normal mood and affect. Her behavior is normal.    Lab Results  Component Value Date   WBC 10.1 11/19/2017   HGB 14.2 11/19/2017   HCT 43.8 11/19/2017   PLT 347.0 11/19/2017   GLUCOSE 95 11/19/2017   CHOL 205 (H) 11/19/2017   TRIG 188.0 (H) 11/19/2017   HDL 46.30 11/19/2017   LDLCALC 121 (H) 11/19/2017   ALT 18 11/19/2017   AST 14 11/19/2017   NA 145 11/19/2017   K 5.3 (H) 11/19/2017   CL 106 11/19/2017   CREATININE 0.72 11/19/2017   BUN 20 11/19/2017   CO2 31 11/19/2017   TSH 1.95  11/19/2017    US Breast Ltd Uni Left Inc Axilla  Result Date: 04/23/2017 CLINICAL DATA:  Six-month follow-up of right breast mass. Focal pain in the lateral left breast. EXAM: 2D DIGITAL DIAGNOSTIC BILATERAL MAMMOGRAM WITH CAD AND ADJUNCT TOMO ULTRASOUND BILATERAL BREAST COMPARISON:  Previous exam(s). ACR Breast Density Category b: There are scattered areas of fibroglandular density. FINDINGS: The right breast mass is much smaller in the interval. No suspicious findings seen elsewhere in either breast. Mammographic images were processed with CAD. On physical exam, no suspicious lumps are identified. Targeted ultrasound is performed, showing no abnormalities in the region of the focal pain on the left. The right breast mass is much smaller in the interval measuring 5 x 2 x 5 mm today versus 9 x 4 x 7 mm in May of 2017. IMPRESSION: The mass in the right breast continues to get smaller, consistent with a benign etiology. No other mammographic or sonographic evidence of malignancy. RECOMMENDATION: Treatment of the patient's left-sided pain should be based on clinical and physical exam given the lack of imaging findings. Recommend annual screening mammography. I have discussed the findings and recommendations with the patient. Results were also provided in writing at the conclusion of the visit. If applicable, a reminder letter will be sent to the patient regarding the next appointment. BI-RADS CATEGORY  2: Benign. Electronically Signed   By: Dorise Bullion III M.D   On: 04/23/2017 12:00   US Breast Ltd Uni Right Inc Axilla  Result Date: 04/23/2017 CLINICAL DATA:  Six-month follow-up of right breast mass. Focal pain in the lateral left breast. EXAM: 2D DIGITAL DIAGNOSTIC BILATERAL MAMMOGRAM WITH CAD AND ADJUNCT TOMO ULTRASOUND BILATERAL BREAST COMPARISON:  Previous exam(s). ACR Breast Density Category b: There are scattered areas of fibroglandular density. FINDINGS: The right breast mass is much smaller in the  interval. No suspicious findings seen elsewhere in either breast. Mammographic images were processed with CAD. On physical exam, no suspicious lumps are identified. Targeted ultrasound is performed, showing no abnormalities in the region of the focal pain on the left. The right breast mass is much smaller in the interval measuring 5 x 2 x 5 mm today versus 9 x 4 x 7 mm in May of 2017. IMPRESSION: The mass in the right breast continues to get smaller, consistent with a benign etiology. No other mammographic or sonographic evidence of malignancy. RECOMMENDATION: Treatment of the patient's left-sided pain should be based on clinical and physical exam given the lack of imaging findings. Recommend annual screening mammography. I have discussed the findings and recommendations with the patient. Results were also provided in writing at the conclusion of the visit. If applicable, a reminder letter will be sent to the patient regarding the next appointment. BI-RADS CATEGORY  2: Benign. Electronically Signed   By: Dorise Bullion III  M.D   On: 04/23/2017 12:00   Mm Diag Breast Tomo Bilateral  Result Date: 04/23/2017 CLINICAL DATA:  Six-month follow-up of right breast mass. Focal pain in the lateral left breast. EXAM: 2D DIGITAL DIAGNOSTIC BILATERAL MAMMOGRAM WITH CAD AND ADJUNCT TOMO ULTRASOUND BILATERAL BREAST COMPARISON:  Previous exam(s). ACR Breast Density Category b: There are scattered areas of fibroglandular density. FINDINGS: The right breast mass is much smaller in the interval. No suspicious findings seen elsewhere in either breast. Mammographic images were processed with CAD. On physical exam, no suspicious lumps are identified. Targeted ultrasound is performed, showing no abnormalities in the region of the focal pain on the left. The right breast mass is much smaller in the interval measuring 5 x 2 x 5 mm today versus 9 x 4 x 7 mm in May of 2017. IMPRESSION: The mass in the right breast continues to get  smaller, consistent with a benign etiology. No other mammographic or sonographic evidence of malignancy. RECOMMENDATION: Treatment of the patient's left-sided pain should be based on clinical and physical exam given the lack of imaging findings. Recommend annual screening mammography. I have discussed the findings and recommendations with the patient. Results were also provided in writing at the conclusion of the visit. If applicable, a reminder letter will be sent to the patient regarding the next appointment. BI-RADS CATEGORY  2: Benign. Electronically Signed   By: Dorise Bullion III M.D   On: 04/23/2017 12:00    Assessment & Plan:   Belky was seen today for sinusitis.  Diagnoses and all orders for this visit:  Acute non-recurrent maxillary sinusitis -     amoxicillin (AMOXIL) 500 MG capsule; Take 1 capsule (500 mg total) by mouth 3 (three) times daily. -     fluticasone (FLONASE) 50 MCG/ACT nasal spray; Place 2 sprays into both nostrils daily.  Elevated BP without diagnosis of hypertension   I am having Kaiden S. Schank "Sherlene" start on amoxicillin. I am also having her maintain her calcium carbonate, Magnesium, Vitamin D3, vitamin E, loratadine, ALPRAZolam, and fluticasone.  Meds ordered this encounter  Medications  . amoxicillin (AMOXIL) 500 MG capsule    Sig: Take 1 capsule (500 mg total) by mouth 3 (three) times daily.    Dispense:  30 capsule    Refill:  0  . fluticasone (FLONASE) 50 MCG/ACT nasal spray    Sig: Place 2 sprays into both nostrils daily.    Dispense:  16 g    Refill:  6   Drop back by for blood pressure check in a few weeks.  She will discontinue the cold prep with phenylephrine in it.  She complete the course of amoxicillin as directed and use the Flonase regularly over the next few weeks. Follow-up: No Follow-up on file.  Libby Maw, MD

## 2018-01-22 DIAGNOSIS — M9902 Segmental and somatic dysfunction of thoracic region: Secondary | ICD-10-CM | POA: Diagnosis not present

## 2018-01-22 DIAGNOSIS — M9901 Segmental and somatic dysfunction of cervical region: Secondary | ICD-10-CM | POA: Diagnosis not present

## 2018-01-22 DIAGNOSIS — M4003 Postural kyphosis, cervicothoracic region: Secondary | ICD-10-CM | POA: Diagnosis not present

## 2018-01-22 DIAGNOSIS — M542 Cervicalgia: Secondary | ICD-10-CM | POA: Diagnosis not present

## 2018-01-25 ENCOUNTER — Encounter (HOSPITAL_COMMUNITY): Payer: Self-pay | Admitting: Emergency Medicine

## 2018-01-25 ENCOUNTER — Other Ambulatory Visit: Payer: Self-pay

## 2018-01-25 ENCOUNTER — Emergency Department (HOSPITAL_COMMUNITY)
Admission: EM | Admit: 2018-01-25 | Discharge: 2018-01-25 | Disposition: A | Discharge status: Home / Self Care | Payer: BLUE CROSS/BLUE SHIELD | Attending: Emergency Medicine | Admitting: Emergency Medicine

## 2018-01-25 ENCOUNTER — Emergency Department (HOSPITAL_COMMUNITY): Payer: BLUE CROSS/BLUE SHIELD

## 2018-01-25 DIAGNOSIS — Z79899 Other long term (current) drug therapy: Secondary | ICD-10-CM | POA: Insufficient documentation

## 2018-01-25 DIAGNOSIS — I10 Essential (primary) hypertension: Secondary | ICD-10-CM | POA: Diagnosis not present

## 2018-01-25 DIAGNOSIS — R42 Dizziness and giddiness: Secondary | ICD-10-CM | POA: Diagnosis not present

## 2018-01-25 DIAGNOSIS — F17210 Nicotine dependence, cigarettes, uncomplicated: Secondary | ICD-10-CM | POA: Insufficient documentation

## 2018-01-25 DIAGNOSIS — R110 Nausea: Secondary | ICD-10-CM | POA: Diagnosis not present

## 2018-01-25 LAB — CBC
HCT: 42.1 % (ref 36.0–46.0)
Hemoglobin: 14.3 g/dL (ref 12.0–15.0)
MCH: 31.8 pg (ref 26.0–34.0)
MCHC: 34 g/dL (ref 30.0–36.0)
MCV: 93.6 fL (ref 78.0–100.0)
Platelets: 305 10*3/uL (ref 150–400)
RBC: 4.5 MIL/uL (ref 3.87–5.11)
RDW: 13.1 % (ref 11.5–15.5)
WBC: 8.3 10*3/uL (ref 4.0–10.5)

## 2018-01-25 LAB — BASIC METABOLIC PANEL
Anion gap: 12 (ref 5–15)
BUN: 14 mg/dL (ref 6–20)
CO2: 22 mmol/L (ref 22–32)
Calcium: 10 mg/dL (ref 8.9–10.3)
Chloride: 106 mmol/L (ref 101–111)
Creatinine, Ser: 0.69 mg/dL (ref 0.44–1.00)
GFR calc Af Amer: >60 mL/min (ref >60)
GFR calc non Af Amer: >60 mL/min (ref >60)
Glucose, Bld: 147 mg/dL — ABNORMAL HIGH (ref 65–99)
Potassium: 3.7 mmol/L (ref 3.5–5.1)
Sodium: 140 mmol/L (ref 135–145)

## 2018-01-25 LAB — I-STAT TROPONIN, ED: Troponin i, poc: 0 ng/mL (ref 0.00–0.08)

## 2018-01-25 LAB — I-STAT BETA HCG BLOOD, ED (MC, WL, AP ONLY): I-stat hCG, quantitative: <5 m[IU]/mL (ref <5)

## 2018-01-25 NOTE — Discharge Instructions (Signed)
You were seen today for high blood pressure.  Her blood pressure normalized while in the ER without intervention.  Your workup is reassuring.  Keep a log of your blood pressure.  Take your blood pressure twice daily and only otherwise if you are feeling symptomatic.  If you develop chest pain, shortness of breath, or worsening symptoms you should be reevaluated.

## 2018-01-25 NOTE — ED Triage Notes (Signed)
Pt presents with blood pressure reading high on home BP machine; pt states she felt her BP in her ears and feels nauseated; pt states she was diagnosed with HTN in the past and was on BP meds, then taken off; pt states she has consistently been checking her BP for the last 12 days; pt appears anxious in triage

## 2018-01-25 NOTE — ED Provider Notes (Signed)
[place] EMERGENCY DEPARTMENT Provider Note   CSN: [id number] Arrival date & time: 01/25/18  0136     History   Chief Complaint Chief Complaint  Patient presents with  . Hypertension  . Nausea    HPI [name] [name] is a 62 y.o. female.  HPI  This is a 61 year old female with a history of hypertension, fibromyalgia who presents with high blood pressure.  Patient reports over the last several weeks she has noted increasing twins and her blood pressure.  It initially started when she had an upper respiratory infection.  She took a few days of over-the-counter medication with phenylephrine in it.  She was seen at her primary physician office and noted to have high blood pressure.  Felt to be related to medications.  She has since stopped that.  She has monitored her pressure closely.  This evening prior to presentation she states that she felt nauseous and "could feel my blood pressure in my ears."  Denies headache, chest pain, shortness of breath.  She at one point was on blood pressure medication but "it dropped me to low."  She currently is asymptomatic.  Blood pressure now is 143/74.  She reports increasing stressors at home.  Past Medical History:  Diagnosis Date  . Anxiety   . Arthritis    HANDS,  KNEES  . Bone tumor (benign)    RIGHT FEMUR  . Borderline hypertension   . Female pelvic pain   . Fibromyalgia   . Left knee injury    TENDONDINITIS/  CHONDRAMALIA  . PONV (postoperative nausea and vomiting)     Patient Active Problem List   Diagnosis Date Noted  . Elevated BP without diagnosis of hypertension 01/19/2018  . Well woman exam 11/19/2017  . HLD (hyperlipidemia) 02/22/2016  . Adjustment disorder with mixed anxiety and depressed mood 07/04/2015  . Allergic rhinitis 07/04/2015  . HTN (hypertension) 03/24/2015  . Acute non-recurrent maxillary sinusitis 11/04/2014  . Fibromyalgia   . 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 DILATION AND CURETTAGE OF UTERUS  1986  &  1988   RETAINED PLACENTA  . LAPAROSCOPIC ASSISTED VAGINAL HYSTERECTOMY  01-25-2003  . LAPAROSCOPY N/A 04/18/2014   Procedure: LAPAROSCOPY DIAGNOSTIC;  Surgeon: Margarette Asal, MD;  Location: [facility];  Service: Gynecology;  Laterality: N/A;  . NASAL SEPTUM SURGERY  AGE 55  . SALPINGOOPHORECTOMY Bilateral 04/18/2014   Procedure: SALPINGO OOPHORECTOMY;  Surgeon: Margarette Asal, MD;  Location: [facility];  Service: Gynecology;  Laterality: Bilateral;  . TONSILLECTOMY  AGE 34  . TUBAL LIGATION      OB History    No data available       Home Medications    Prior to Admission medications   Medication Sig Start Date End Date Taking? Authorizing Provider  ALPRAZolam (XANAX) 0.25 MG tablet Take 1 tablet (0.25 mg total) by mouth daily as needed. Patient taking differently: Take 0.25 mg by mouth daily as needed for anxiety.  11/19/17  Yes Lucille Passy, MD  amoxicillin (AMOXIL) 500 MG capsule Take 1 capsule (500 mg total) by mouth 3 (three) times daily. 01/19/18  Yes Libby Maw, MD  calcium carbonate (OS-CAL) 600 MG TABS Take 1 tablet by mouth 2 (two) times daily.   Yes [provider]  Cholecalciferol (VITAMIN D3) 2000 UNITS TABS Take 1 capsule by mouth daily.   Yes [provider]  fluticasone (FLONASE) 50 MCG/ACT nasal spray Place 2 sprays into both nostrils daily. 01/19/18  Yes Libby Maw, MD  Magnesium 250 MG TABS Take 1 tablet by mouth daily.   Yes [provider]  vitamin E 400 UNIT capsule Take 400 Units by mouth daily.   Yes [provider]  loratadine (CLARITIN) 10 MG tablet Take 1 tablet (10 mg total) by mouth daily. Patient not taking: Reported on 01/25/2018 01/15/17   Leone Haven, MD    Family History Family History  Problem Relation Age of Onset  . Colon cancer Neg Hx   . Stomach cancer Neg Hx      Social History Social History   Tobacco Use  . Smoking status: Current Every Day Smoker    Packs/day: 0.50    Years: 30.00    Pack years: 15.00    Types: Cigarettes  . Smokeless tobacco: Never Used  Substance Use Topics  . Alcohol use: Yes    Comment: rare  . Drug use: No     Allergies   Hydrocodone; Demerol [meperidine]; and Sulfa antibiotics   Review of Systems Review of Systems  Constitutional: Negative for fever.  Respiratory: Negative for shortness of breath.   Cardiovascular: Negative for chest pain.  Gastrointestinal: Positive for nausea.  Neurological: Positive for light-headedness and headaches. Negative for dizziness.  All other systems reviewed and are negative.    Physical Exam Updated Vital Signs BP (!) 143/74   Pulse (!) 59   Temp 97.8 F (36.6 C) (Oral)   Resp 20   Ht 5\' 8"  (1.727 m)   Wt 74.8 kg (165 lb)   SpO2 95%   BMI 25.09 kg/m   Physical Exam  Constitutional: She is oriented to person, place, and time. She appears well-developed and well-nourished. No distress.  HENT:  Head: Normocephalic and atraumatic.  Eyes: Pupils are equal, round, and reactive to light.  Cardiovascular: Normal rate, regular rhythm and normal heart sounds.  No murmur heard. Pulmonary/Chest: Effort normal. No respiratory distress. She has no wheezes.  Abdominal: Soft. Bowel sounds are normal. There is no tenderness. There is no guarding.  Neurological: She is alert and oriented to person, place, and time.  Cranial nerves II through XII intact, 5 out of 5 strength in all 4 extremities, no dysmetria to finger-nose-finger  Skin: Skin is warm and dry.  Psychiatric: She has a normal mood and affect.  Nursing note and vitals reviewed.    ED Treatments / Results  Labs (all labs ordered are listed, but only abnormal results are displayed) Labs Reviewed  BASIC METABOLIC PANEL - Abnormal; Notable for the following components:      Result Value   Glucose, Bld 147  (*)    All other components within normal limits  CBC  I-STAT TROPONIN, ED  I-STAT BETA HCG BLOOD, ED ([facility], [facility], [facility] ONLY)    EKG  EKG Interpretation  Date/Time:  Sunday January 25 2018 02:04:23 EST Ventricular Rate:  61 PR Interval:  142 QRS Duration: 82 QT Interval:  410 QTC Calculation: 412 R Axis:   68 Text Interpretation:  Normal sinus rhythm Normal ECG Confirmed by Thayer Jew [phone]) on 01/25/2018 4:58:37 AM       Radiology Dg Chest 2 View  Result Date: 01/25/2018 CLINICAL DATA:  Acute onset of high blood pressure.  Nausea. EXAM: CHEST  2 VIEW COMPARISON:  None. FINDINGS: The lungs are well-aerated and clear. There is no evidence of focal opacification, pleural effusion or  pneumothorax. The heart is normal in size; the mediastinal contour is within normal limits. No acute osseous abnormalities are seen. IMPRESSION: No acute cardiopulmonary process seen. Electronically Signed   By: Garald Balding M.D.   On: 01/25/2018 02:49    Procedures Procedures (including critical care time)  Medications Ordered in ED Medications - No data to display   Initial Impression / Assessment and Plan / ED Course  I have reviewed the triage vital signs and the nursing notes.  Pertinent labs & imaging results that were available during my care of the patient were reviewed by me and considered in my medical decision making (see chart for details).     Patient presents with high blood pressure readings at home.  Felt nauseous and could hear her pulse in her ear.  Her symptoms have since resolved.  Initial blood pressure was elevated at 194/75.  However on my evaluation is 143/74.  Her exam is benign.  EKG, basic lab work are all reassuring.  Blood pressure normalized without intervention.  Doubt hypertensive urgency or emergency at this time.  Patient will need close follow-up with her primary physician.  Recommend keeping logs of her blood pressure.  She was given strict return  precautions.  After history, exam, and medical workup I feel the patient has been appropriately medically screened and is safe for discharge home. Pertinent diagnoses were discussed with the patient. Patient was given return precautions.   Final Clinical Impressions(s) / ED Diagnoses   Final diagnoses:  Essential hypertension    ED Discharge Orders    None       Merryl Hacker, MD 01/25/18 [phone]

## 2018-01-26 DIAGNOSIS — M9902 Segmental and somatic dysfunction of thoracic region: Secondary | ICD-10-CM | POA: Diagnosis not present

## 2018-01-26 DIAGNOSIS — M542 Cervicalgia: Secondary | ICD-10-CM | POA: Diagnosis not present

## 2018-01-26 DIAGNOSIS — M9901 Segmental and somatic dysfunction of cervical region: Secondary | ICD-10-CM | POA: Diagnosis not present

## 2018-01-26 DIAGNOSIS — M4003 Postural kyphosis, cervicothoracic region: Secondary | ICD-10-CM | POA: Diagnosis not present

## 2018-01-29 DIAGNOSIS — M9902 Segmental and somatic dysfunction of thoracic region: Secondary | ICD-10-CM | POA: Diagnosis not present

## 2018-01-29 DIAGNOSIS — M4003 Postural kyphosis, cervicothoracic region: Secondary | ICD-10-CM | POA: Diagnosis not present

## 2018-01-29 DIAGNOSIS — M9901 Segmental and somatic dysfunction of cervical region: Secondary | ICD-10-CM | POA: Diagnosis not present

## 2018-01-29 DIAGNOSIS — M542 Cervicalgia: Secondary | ICD-10-CM | POA: Diagnosis not present

## 2018-02-04 DIAGNOSIS — M542 Cervicalgia: Secondary | ICD-10-CM | POA: Diagnosis not present

## 2018-02-04 DIAGNOSIS — M9902 Segmental and somatic dysfunction of thoracic region: Secondary | ICD-10-CM | POA: Diagnosis not present

## 2018-02-04 DIAGNOSIS — M4003 Postural kyphosis, cervicothoracic region: Secondary | ICD-10-CM | POA: Diagnosis not present

## 2018-02-04 DIAGNOSIS — M9901 Segmental and somatic dysfunction of cervical region: Secondary | ICD-10-CM | POA: Diagnosis not present

## 2018-02-09 DIAGNOSIS — M542 Cervicalgia: Secondary | ICD-10-CM | POA: Diagnosis not present

## 2018-02-09 DIAGNOSIS — M9901 Segmental and somatic dysfunction of cervical region: Secondary | ICD-10-CM | POA: Diagnosis not present

## 2018-02-09 DIAGNOSIS — M4003 Postural kyphosis, cervicothoracic region: Secondary | ICD-10-CM | POA: Diagnosis not present

## 2018-02-09 DIAGNOSIS — M9902 Segmental and somatic dysfunction of thoracic region: Secondary | ICD-10-CM | POA: Diagnosis not present

## 2018-02-11 ENCOUNTER — Encounter: Payer: Self-pay | Admitting: Family Medicine

## 2018-02-11 ENCOUNTER — Ambulatory Visit: Payer: BLUE CROSS/BLUE SHIELD | Admitting: Family Medicine

## 2018-02-11 VITALS — BP 126/74 | HR 85 | Temp 98.6°F | Ht 68.0 in | Wt 168.0 lb

## 2018-02-11 DIAGNOSIS — I10 Essential (primary) hypertension: Secondary | ICD-10-CM | POA: Diagnosis not present

## 2018-02-11 NOTE — Progress Notes (Signed)
Subjective:   Patient ID: [name] [name], female    DOB: Sep 15, 1956, 62 y.o.   MRN: [id number]  [name] [name] is a pleasant 62 y.o. year old female who presents to clinic today with Hospitalization Follow-up (Patient is here today to F/U from hospital visit from 2.10.19  She went to hospital for elevated BP, nausea, and pulsar tinnitus.  BP was 194/75 at hospital arrival but normalized without intervention before leaving.  She was advised to keep a bid log but she same some readings only.Marland Kitchen)  on 02/11/2018  HPI: ER follow up.  Presented to ER on 01/25/18 with complaint of elevated blodd pressure.  Note reviewed.  She felt over the weeks prior to going to the ER her BP had been rising.  She had been taking OTC rxs with sudafed in them.  She also has increased stressors at home.  Upon arrival in the ER, BP was 194/75.  Exam benign, she was asymptomatic other than some nausea and feeling her heart beat in her ears, which resolved while in ER. ER, CXR and labs reassuring.  Dg Chest 2 View  Result Date: 01/25/2018 CLINICAL DATA:  Acute onset of high blood pressure.  Nausea. EXAM: CHEST  2 VIEW COMPARISON:  None. FINDINGS: The lungs are well-aerated and clear. There is no evidence of focal opacification, pleural effusion or pneumothorax. The heart is normal in size; the mediastinal contour is within normal limits. No acute osseous abnormalities are seen. IMPRESSION: No acute cardiopulmonary process seen. Electronically Signed   By: Garald Balding M.D.   On: 01/25/2018 02:49   After spending time in ER, BP came down to 143/74.  Discharged home without new rx and advised to follow up with me. No longer taking any antihypertensives.  Was on HCTZ which was dc'd due to hypotension.  Current Outpatient Medications on File Prior to Visit  Medication Sig Dispense Refill  . ALPRAZolam (XANAX) 0.25 MG tablet Take 1 tablet (0.25 mg total) by mouth daily as needed. (Patient taking differently: Take 0.25 mg  by mouth daily as needed for anxiety. ) 30 tablet 2  . calcium carbonate (OS-CAL) 600 MG TABS Take 1 tablet by mouth 2 (two) times daily. Bone-Up Supp.    . Cholecalciferol (VITAMIN D3) 2000 UNITS TABS Take 1 capsule by mouth daily.    . fluticasone (FLONASE) 50 MCG/ACT nasal spray Place 2 sprays into both nostrils daily. 16 g 6  . Magnesium 250 MG TABS Take 1 tablet by mouth daily.    . vitamin E 400 UNIT capsule Take 400 Units by mouth daily.     No current facility-administered medications on file prior to visit.     Allergies  Allergen Reactions  . Hydrocodone Rash  . Demerol [Meperidine] Nausea And Vomiting    severe  . Sulfa Antibiotics Swelling and Rash    Past Medical History:  Diagnosis Date  . Anxiety   . Arthritis    HANDS,  KNEES  . Bone tumor (benign)    RIGHT FEMUR  . Borderline hypertension   . Female pelvic pain   . Fibromyalgia   . Left knee injury    TENDONDINITIS/  CHONDRAMALIA  . PONV (postoperative nausea and vomiting)     Past Surgical History:  Procedure Laterality Date  . DILATION AND CURETTAGE OF UTERUS  1986  &  1988   RETAINED PLACENTA  . LAPAROSCOPIC ASSISTED VAGINAL HYSTERECTOMY  01-25-2003  . LAPAROSCOPY N/A 04/18/2014   Procedure: LAPAROSCOPY DIAGNOSTIC;  Surgeon: Margarette Asal, MD;  Location: [facility];  Service: Gynecology;  Laterality: N/A;  . NASAL SEPTUM SURGERY  AGE 62  . SALPINGOOPHORECTOMY Bilateral 04/18/2014   Procedure: SALPINGO OOPHORECTOMY;  Surgeon: Margarette Asal, MD;  Location: [facility];  Service: Gynecology;  Laterality: Bilateral;  . TONSILLECTOMY  AGE 62  . TUBAL LIGATION      Family History  Problem Relation Age of Onset  . Colon cancer Neg Hx   . Stomach cancer Neg Hx     Social History   Socioeconomic History  . Marital status: Married    Spouse name: Not on file  . Number of children: Not on file  . Years of education: Not on file  . Highest education level: Not on  file  Social Needs  . Financial resource strain: Not on file  . Food insecurity - worry: Not on file  . Food insecurity - inability: Not on file  . Transportation needs - medical: Not on file  . Transportation needs - non-medical: Not on file  Occupational History  . Not on file  Tobacco Use  . Smoking status: Current Every Day Smoker    Packs/day: 0.50    Years: 30.00    Pack years: 15.00    Types: Cigarettes  . Smokeless tobacco: Never Used  Substance and Sexual Activity  . Alcohol use: Yes    Comment: rare  . Drug use: No  . Sexual activity: Not on file  Other Topics Concern  . Not on file  Social History Narrative  . Not on file   The PMH, PSH, Social History, Family History, Medications, and allergies have been reviewed in [facility], and have been updated if relevant.   Review of Systems  Constitutional: Negative.   HENT: Negative.   Eyes: Negative.   Respiratory: Negative.   Cardiovascular: Negative.   Gastrointestinal: Negative.   Endocrine: Negative.   Genitourinary: Negative.   Musculoskeletal: Negative.   Skin: Negative.   Allergic/Immunologic: Negative.   Neurological: Negative.   Hematological: Negative.   Psychiatric/Behavioral: Negative.   All other systems reviewed and are negative.      Objective:    BP 126/74 (BP Location: Left Arm, Patient Position: Sitting, Cuff Size: Normal)   Pulse 85   Temp 98.6 F (37 C) (Oral)   Ht 5\' 8"  (1.727 m)   Wt 168 lb (76.2 kg)   SpO2 96%   BMI 25.54 kg/m    Physical Exam  Constitutional: She is oriented to person, place, and time. She appears well-developed and well-nourished. No distress.  HENT:  Head: Normocephalic and atraumatic.  Eyes: Conjunctivae are normal.  Neck: Normal range of motion.  Cardiovascular: Normal rate, regular rhythm and normal heart sounds.  Pulmonary/Chest: Effort normal and breath sounds normal. No respiratory distress.  Musculoskeletal: Normal range of motion. She exhibits no  edema.  Neurological: She is alert and oriented to person, place, and time. No cranial nerve deficit.  Skin: Skin is warm and dry. She is not diaphoretic.  Psychiatric: She has a normal mood and affect. Her behavior is normal. Judgment and thought content normal.  Nursing note and vitals reviewed.         Assessment & Plan:   Essential hypertension No Follow-up on file.

## 2018-02-11 NOTE — Assessment & Plan Note (Signed)
Normotensive today.  Showed her how to properly use her BP cuff at home. Reassurance provided.  No changes made to rxs. Call or return to clinic prn if these symptoms worsen or fail to improve as anticipated. The patient indicates understanding of these issues and agrees with the plan.

## 2018-02-12 DIAGNOSIS — M542 Cervicalgia: Secondary | ICD-10-CM | POA: Diagnosis not present

## 2018-02-12 DIAGNOSIS — M4003 Postural kyphosis, cervicothoracic region: Secondary | ICD-10-CM | POA: Diagnosis not present

## 2018-02-12 DIAGNOSIS — M9901 Segmental and somatic dysfunction of cervical region: Secondary | ICD-10-CM | POA: Diagnosis not present

## 2018-02-12 DIAGNOSIS — M9902 Segmental and somatic dysfunction of thoracic region: Secondary | ICD-10-CM | POA: Diagnosis not present

## 2018-02-16 DIAGNOSIS — M9902 Segmental and somatic dysfunction of thoracic region: Secondary | ICD-10-CM | POA: Diagnosis not present

## 2018-02-16 DIAGNOSIS — M9901 Segmental and somatic dysfunction of cervical region: Secondary | ICD-10-CM | POA: Diagnosis not present

## 2018-02-16 DIAGNOSIS — M4003 Postural kyphosis, cervicothoracic region: Secondary | ICD-10-CM | POA: Diagnosis not present

## 2018-02-16 DIAGNOSIS — M542 Cervicalgia: Secondary | ICD-10-CM | POA: Diagnosis not present

## 2018-02-18 DIAGNOSIS — M4003 Postural kyphosis, cervicothoracic region: Secondary | ICD-10-CM | POA: Diagnosis not present

## 2018-02-18 DIAGNOSIS — M9902 Segmental and somatic dysfunction of thoracic region: Secondary | ICD-10-CM | POA: Diagnosis not present

## 2018-02-18 DIAGNOSIS — M542 Cervicalgia: Secondary | ICD-10-CM | POA: Diagnosis not present

## 2018-02-18 DIAGNOSIS — M9901 Segmental and somatic dysfunction of cervical region: Secondary | ICD-10-CM | POA: Diagnosis not present

## 2018-02-23 DIAGNOSIS — M4003 Postural kyphosis, cervicothoracic region: Secondary | ICD-10-CM | POA: Diagnosis not present

## 2018-02-23 DIAGNOSIS — M9901 Segmental and somatic dysfunction of cervical region: Secondary | ICD-10-CM | POA: Diagnosis not present

## 2018-02-23 DIAGNOSIS — M9902 Segmental and somatic dysfunction of thoracic region: Secondary | ICD-10-CM | POA: Diagnosis not present

## 2018-02-23 DIAGNOSIS — M542 Cervicalgia: Secondary | ICD-10-CM | POA: Diagnosis not present

## 2018-02-26 DIAGNOSIS — M9901 Segmental and somatic dysfunction of cervical region: Secondary | ICD-10-CM | POA: Diagnosis not present

## 2018-02-26 DIAGNOSIS — M542 Cervicalgia: Secondary | ICD-10-CM | POA: Diagnosis not present

## 2018-02-26 DIAGNOSIS — M4003 Postural kyphosis, cervicothoracic region: Secondary | ICD-10-CM | POA: Diagnosis not present

## 2018-02-26 DIAGNOSIS — M9902 Segmental and somatic dysfunction of thoracic region: Secondary | ICD-10-CM | POA: Diagnosis not present

## 2018-03-05 DIAGNOSIS — M9901 Segmental and somatic dysfunction of cervical region: Secondary | ICD-10-CM | POA: Diagnosis not present

## 2018-03-05 DIAGNOSIS — M542 Cervicalgia: Secondary | ICD-10-CM | POA: Diagnosis not present

## 2018-03-05 DIAGNOSIS — M4003 Postural kyphosis, cervicothoracic region: Secondary | ICD-10-CM | POA: Diagnosis not present

## 2018-03-05 DIAGNOSIS — M9902 Segmental and somatic dysfunction of thoracic region: Secondary | ICD-10-CM | POA: Diagnosis not present

## 2018-03-11 ENCOUNTER — Telehealth: Payer: Self-pay

## 2018-03-11 DIAGNOSIS — M9902 Segmental and somatic dysfunction of thoracic region: Secondary | ICD-10-CM | POA: Diagnosis not present

## 2018-03-11 DIAGNOSIS — M2550 Pain in unspecified joint: Secondary | ICD-10-CM

## 2018-03-11 DIAGNOSIS — M542 Cervicalgia: Secondary | ICD-10-CM | POA: Diagnosis not present

## 2018-03-11 DIAGNOSIS — M9901 Segmental and somatic dysfunction of cervical region: Secondary | ICD-10-CM | POA: Diagnosis not present

## 2018-03-11 DIAGNOSIS — M4003 Postural kyphosis, cervicothoracic region: Secondary | ICD-10-CM | POA: Diagnosis not present

## 2018-03-11 NOTE — Telephone Encounter (Signed)
[name] is requesting a referral to Dr. Estanislado Pandy Rheumatologist/although she has been seen there before they are still requiring a referral/I am assuming it is for her Fibromyalgia? plz advise/thx dmf   Copied from [place] [id number]. Topic: Referral - Request >> Mar 11, 2018  3:58 PM [name] [name] wrote: Reason for CRM: Patient would like a referral to [facility] Dr. Bo Merino, MD Rheumatologist. She's been seen there before and they require a referral at this time. Patient woul dlike a call back once referral has been submitted.

## 2018-03-18 DIAGNOSIS — M4003 Postural kyphosis, cervicothoracic region: Secondary | ICD-10-CM | POA: Diagnosis not present

## 2018-03-18 DIAGNOSIS — M9902 Segmental and somatic dysfunction of thoracic region: Secondary | ICD-10-CM | POA: Diagnosis not present

## 2018-03-18 DIAGNOSIS — M9901 Segmental and somatic dysfunction of cervical region: Secondary | ICD-10-CM | POA: Diagnosis not present

## 2018-03-18 DIAGNOSIS — M542 Cervicalgia: Secondary | ICD-10-CM | POA: Diagnosis not present

## 2018-04-06 DIAGNOSIS — D2361 Other benign neoplasm of skin of right upper limb, including shoulder: Secondary | ICD-10-CM | POA: Diagnosis not present

## 2018-04-06 DIAGNOSIS — L601 Onycholysis: Secondary | ICD-10-CM | POA: Diagnosis not present

## 2018-04-08 DIAGNOSIS — M9901 Segmental and somatic dysfunction of cervical region: Secondary | ICD-10-CM | POA: Diagnosis not present

## 2018-04-08 DIAGNOSIS — M4003 Postural kyphosis, cervicothoracic region: Secondary | ICD-10-CM | POA: Diagnosis not present

## 2018-04-08 DIAGNOSIS — M542 Cervicalgia: Secondary | ICD-10-CM | POA: Diagnosis not present

## 2018-04-08 DIAGNOSIS — M9902 Segmental and somatic dysfunction of thoracic region: Secondary | ICD-10-CM | POA: Diagnosis not present

## 2018-04-14 DIAGNOSIS — L820 Inflamed seborrheic keratosis: Secondary | ICD-10-CM | POA: Diagnosis not present

## 2018-04-14 DIAGNOSIS — D1801 Hemangioma of skin and subcutaneous tissue: Secondary | ICD-10-CM | POA: Diagnosis not present

## 2018-04-14 DIAGNOSIS — D2239 Melanocytic nevi of other parts of face: Secondary | ICD-10-CM | POA: Diagnosis not present

## 2018-04-14 DIAGNOSIS — D225 Melanocytic nevi of trunk: Secondary | ICD-10-CM | POA: Diagnosis not present

## 2018-04-14 DIAGNOSIS — D485 Neoplasm of uncertain behavior of skin: Secondary | ICD-10-CM | POA: Diagnosis not present

## 2018-04-14 DIAGNOSIS — L821 Other seborrheic keratosis: Secondary | ICD-10-CM | POA: Diagnosis not present

## 2018-04-16 NOTE — Progress Notes (Deleted)
Office Visit Note  Patient: [name] [name]             Date of Birth: June 14, 1956           MRN: [id number]             PCP: Lucille Passy, MD Referring: Lucille Passy, MD Visit Date: 04/30/2018 Occupation: @GUAROCC @    Subjective:  No chief complaint on file.   History of Present Illness: [name] [name] is a 62 y.o. female ***   Activities of Daily Living:  Patient reports morning stiffness for *** {minute/hour:19697}.   Patient {ACTIONS;DENIES/REPORTS:21021675::"Denies"} nocturnal pain.  Difficulty dressing/grooming: {ACTIONS;DENIES/REPORTS:21021675::"Denies"} Difficulty climbing stairs: {ACTIONS;DENIES/REPORTS:21021675::"Denies"} Difficulty getting out of chair: {ACTIONS;DENIES/REPORTS:21021675::"Denies"} Difficulty using hands for taps, buttons, cutlery, and/or writing: {ACTIONS;DENIES/REPORTS:21021675::"Denies"}   No Rheumatology ROS completed.   PMFS History:  Patient Active Problem List   Diagnosis Date Noted  . Elevated BP without diagnosis of hypertension 01/19/2018  . HLD (hyperlipidemia) 02/22/2016  . Adjustment disorder with mixed anxiety and depressed mood 07/04/2015  . Allergic rhinitis 07/04/2015  . HTN (hypertension) 03/24/2015  . Acute non-recurrent maxillary sinusitis 11/04/2014  . Fibromyalgia   . 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 Anxiety   . Arthritis    HANDS,  KNEES  . Bone tumor (benign)    RIGHT FEMUR  . Borderline hypertension   . Female pelvic pain   . Fibromyalgia   . Left knee injury    TENDONDINITIS/  CHONDRAMALIA  . PONV (postoperative nausea and vomiting)     Family History  Problem Relation Age of Onset  . Colon cancer Neg Hx   . Stomach cancer Neg Hx    Past Surgical History:  Procedure Laterality Date  . DILATION AND CURETTAGE OF UTERUS  1986  &  1988   RETAINED PLACENTA  . LAPAROSCOPIC ASSISTED VAGINAL HYSTERECTOMY  01-25-2003  . LAPAROSCOPY N/A 04/18/2014   Procedure:  LAPAROSCOPY DIAGNOSTIC;  Surgeon: Margarette Asal, MD;  Location[facility];  Service: Gynecology;  Laterality: N/A;  . NASAL SEPTUM SURGERY  AGE 71  . SALPINGOOPHORECTOMY Bilateral 04/18/2014   Procedure: SALPINGO OOPHORECTOMY;  Surgeon: Margarette Asal, MD;  Location: [facility];  Service: Gynecology;  Laterality: Bilateral;  . TONSILLECTOMY  AGE 40  . TUBAL LIGATION     Social History   Social History Narrative  . Not on file     Objective: Vital Signs: There were no vitals taken for this visit.   Physical Exam   Musculoskeletal Exam: ***  CDAI Exam: No CDAI exam completed.    Investigation: No additional findings. CBC Latest Ref Rng & Units 01/25/2018 11/19/2017 02/23/2016  WBC 4.0 - 10.5 K/uL 8.3 10.1 7.6  Hemoglobin 12.0 - 15.0 g/dL 14.3 14.2 14.2  Hematocrit 36.0 - 46.0 % 42.1 43.8 42.3  Platelets 150 - 400 K/uL 305 347.0 281.0   CMP Latest Ref Rng & Units 01/25/2018 11/19/2017 02/23/2016  Glucose 65 - 99 mg/dL 147(H) 95 108(H)  BUN 6 - 20 mg/dL 14 20 15   Creatinine 0.44 - 1.00 mg/dL 0.69 0.72 0.82  Sodium 135 - 145 mmol/L 140 145 142  Potassium 3.5 - 5.1 mmol/L 3.7 5.3(H) 4.2  Chloride 101 - 111 mmol/L 106 106 105  CO2 22 - 32 mmol/L 22 31 30   Calcium 8.9 - 10.3 mg/dL 10.0 10.0 10.0  Total Protein 6.0 - 8.3 g/dL - 6.7 6.9  Total  Bilirubin 0.2 - 1.2 mg/dL - 0.5 0.6  Alkaline Phos 39 - 117 U/L - 74 71  AST 0 - 37 U/L - 14 16  ALT 0 - 35 U/L - 18 18    Imaging: No results found.  Speciality Comments: No specialty comments available.    Procedures:  No procedures performed Allergies: Hydrocodone; Demerol [meperidine]; and Sulfa antibiotics   Assessment / Plan:     Visit Diagnoses: Polyarthralgia  Fibromyalgia  Non-ossified fibroma of bone  Essential hypertension  History of hyperlipidemia  Adjustment disorder with mixed anxiety and depressed mood    Orders: No orders of the defined types were placed in this  encounter.  No orders of the defined types were placed in this encounter.   Face-to-face time spent with patient was *** minutes. 50% of time was spent in counseling and coordination of care.  Follow-Up Instructions: No follow-ups on file.   Ofilia Neas, PA-C  Note - This record has been created using Dragon software.  Chart creation errors have been sought, but may not always  have been located. Such creation errors do not reflect on  the standard of medical care.

## 2018-04-30 ENCOUNTER — Ambulatory Visit: Payer: Self-pay | Admitting: Rheumatology

## 2018-05-14 NOTE — Progress Notes (Deleted)
Office Visit Note  Patient: [name] [name]             Date of Birth: 1956-09-06           MRN: [id number]             PCP: Lucille Passy, MD Referring: Lucille Passy, MD Visit Date: 05/26/2018 Occupation: @GUAROCC @    Subjective:  No chief complaint on file.   History of Present Illness: [name] [name] is a 62 y.o. female ***   Activities of Daily Living:  Patient reports morning stiffness for *** {minute/hour:19697}.   Patient {ACTIONS;DENIES/REPORTS:21021675::"Denies"} nocturnal pain.  Difficulty dressing/grooming: {ACTIONS;DENIES/REPORTS:21021675::"Denies"} Difficulty climbing stairs: {ACTIONS;DENIES/REPORTS:21021675::"Denies"} Difficulty getting out of chair: {ACTIONS;DENIES/REPORTS:21021675::"Denies"} Difficulty using hands for taps, buttons, cutlery, and/or writing: {ACTIONS;DENIES/REPORTS:21021675::"Denies"}   No Rheumatology ROS completed.   PMFS History:  Patient Active Problem List   Diagnosis Date Noted  . Elevated BP without diagnosis of hypertension 01/19/2018  . HLD (hyperlipidemia) 02/22/2016  . Adjustment disorder with mixed anxiety and depressed mood 07/04/2015  . Allergic rhinitis 07/04/2015  . HTN (hypertension) 03/24/2015  . Acute non-recurrent maxillary sinusitis 11/04/2014  . Fibromyalgia   . 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 Anxiety   . Arthritis    HANDS,  KNEES  . Bone tumor (benign)    RIGHT FEMUR  . Borderline hypertension   . Female pelvic pain   . Fibromyalgia   . Left knee injury    TENDONDINITIS/  CHONDRAMALIA  . PONV (postoperative nausea and vomiting)     Family History  Problem Relation Age of Onset  . Colon cancer Neg Hx   . Stomach cancer Neg Hx    Past Surgical History:  Procedure Laterality Date  . DILATION AND CURETTAGE OF UTERUS  1986  &  1988   RETAINED PLACENTA  . LAPAROSCOPIC ASSISTED VAGINAL HYSTERECTOMY  01-25-2003  . LAPAROSCOPY N/A 04/18/2014   Procedure:  LAPAROSCOPY DIAGNOSTIC;  Surgeon: Margarette Asal, MD;  Location: [facility];  Service: Gynecology;  Laterality: N/A;  . NASAL SEPTUM SURGERY  AGE 76  . SALPINGOOPHORECTOMY Bilateral 04/18/2014   Procedure: SALPINGO OOPHORECTOMY;  Surgeon: Margarette Asal, MD;  Location: [facility];  Service: Gynecology;  Laterality: Bilateral;  . TONSILLECTOMY  AGE 73  . TUBAL LIGATION     Social History   Social History Narrative  . Not on file     Objective: Vital Signs: There were no vitals taken for this visit.   Physical Exam   Musculoskeletal Exam: ***  CDAI Exam: No CDAI exam completed.    Investigation: Findings:  11/19/17: TSH 1.95  CBC Latest Ref Rng & Units 01/25/2018 11/19/2017 02/23/2016  WBC 4.0 - 10.5 K/uL 8.3 10.1 7.6  Hemoglobin 12.0 - 15.0 g/dL 14.3 14.2 14.2  Hematocrit 36.0 - 46.0 % 42.1 43.8 42.3  Platelets 150 - 400 K/uL 305 347.0 281.0   CMP Latest Ref Rng & Units 01/25/2018 11/19/2017 02/23/2016  Glucose 65 - 99 mg/dL 147(H) 95 108(H)  BUN 6 - 20 mg/dL 14 20 15   Creatinine 0.44 - 1.00 mg/dL 0.69 0.72 0.82  Sodium 135 - 145 mmol/L 140 145 142  Potassium 3.5 - 5.1 mmol/L 3.7 5.3(H) 4.2  Chloride 101 - 111 mmol/L 106 106 105  CO2 22 - 32 mmol/L 22 31 30   Calcium 8.9 - 10.3 mg/dL 10.0 10.0 10.0  Total Protein 6.0 - 8.3 g/dL - 6.7  6.9  Total Bilirubin 0.2 - 1.2 mg/dL - 0.5 0.6  Alkaline Phos 39 - 117 U/L - 74 71  AST 0 - 37 U/L - 14 16  ALT 0 - 35 U/L - 18 18     Imaging: No results found.  Speciality Comments: No specialty comments available.    Procedures:  No procedures performed Allergies: Hydrocodone; Demerol [meperidine]; and Sulfa antibiotics   Assessment / Plan:     Visit Diagnoses: Polyarthralgia  Fibromyalgia  Non-ossified fibroma of bone  Essential hypertension  History of hyperlipidemia  Adjustment disorder with mixed anxiety and depressed mood    Orders: No orders of the defined types were placed  in this encounter.  No orders of the defined types were placed in this encounter.   Face-to-face time spent with patient was *** minutes. 50% of time was spent in counseling and coordination of care.  Follow-Up Instructions: No follow-ups on file.   Ofilia Neas, PA-C  Note - This record has been created using Dragon software.  Chart creation errors have been sought, but may not always  have been located. Such creation errors do not reflect on  the standard of medical care.

## 2018-05-26 ENCOUNTER — Ambulatory Visit: Payer: Self-pay | Admitting: Rheumatology

## 2018-05-28 ENCOUNTER — Encounter: Payer: Self-pay | Admitting: Family Medicine

## 2018-05-28 ENCOUNTER — Ambulatory Visit: Payer: Self-pay | Admitting: Family Medicine

## 2018-05-28 VITALS — BP 126/78 | HR 56 | Temp 98.2°F | Ht 68.0 in | Wt 165.6 lb

## 2018-05-28 DIAGNOSIS — J0100 Acute maxillary sinusitis, unspecified: Secondary | ICD-10-CM

## 2018-05-28 DIAGNOSIS — J3489 Other specified disorders of nose and nasal sinuses: Secondary | ICD-10-CM

## 2018-05-28 DIAGNOSIS — K0889 Other specified disorders of teeth and supporting structures: Secondary | ICD-10-CM

## 2018-05-28 MED ORDER — METHYLPREDNISOLONE ACETATE 80 MG/ML IJ SUSP
80.0000 mg | Freq: Once | INTRAMUSCULAR | Status: AC
  Administered 2018-05-28: 80 mg via INTRAMUSCULAR

## 2018-05-28 MED ORDER — PREDNISONE 10 MG PO TABS: ORAL_TABLET | ORAL | 0 refills | Status: DC

## 2018-05-28 NOTE — Patient Instructions (Signed)
Great to see you.  Please call your endodontist about your symptoms.  We gave you Depo Medrol (a steroid) shot and start prednisone in the next 1-2 days.  Finish Augmentin. Keep me updated.

## 2018-05-28 NOTE — Progress Notes (Signed)
Subjective:   Patient ID: [name] [name], female    DOB: 11-04-1956, 62 y.o.   MRN: [id number]  [name] [name] is a pleasant 62 y.o. year old female who presents to clinic today with Sinusitis (Patient is here today C/O possible Sinusitis.  She started 2-weeks-ago with pulpitis so called dentist and put on Amox 500mg  tid and then she went to see him on Monday.  He recom her to see an Endodontist to get a crown put on that tooth.  Her maxillary, periorbital, frontal and temporal sinuses are painful.  She had a root canal done on 6.5.19.  He changed her to Augmentin tid and she said "that mess is about to kill me!"  She only has a couple days left of the Augmentin left and is still suffereing .)  on 05/28/2018  HPI:  Patient is here today C/O possible Sinusitis. She started 2-weeks-ago with pulpitis so called dentist and put on Amox 500mg  tid for 5 days and then she went to see him on Monday. He referred her to see an Endodontist to get a root canal which was done.  Still waiting to get crown put on that tooth. Her maxillary, periorbital, frontal and temporal sinuses are painful. She had a root canal done on 6.5.19. He changed her to Augmentin tid and she said "that mess is about to kill me!" She only has a couple days left of the Augmentin left and is still having sinus pain on right side.  No fever.   Current Outpatient Medications on File Prior to Visit  Medication Sig Dispense Refill  . ALPRAZolam (XANAX) 0.25 MG tablet Take 1 tablet (0.25 mg total) by mouth daily as needed. (Patient taking differently: Take 0.25 mg by mouth daily as needed for anxiety. ) 30 tablet 2  . amoxicillin-clavulanate (AUGMENTIN) 500-125 MG tablet Take 1 tablet by mouth 3 (three) times daily.  0  . calcium carbonate (OS-CAL) 600 MG TABS Take 1 tablet by mouth 2 (two) times daily. Bone-Up Supp.    . Cholecalciferol (VITAMIN D3) 2000 UNITS TABS Take 1 capsule by mouth daily.    . fluticasone (FLONASE) 50 MCG/ACT nasal  spray Place 2 sprays into both nostrils daily. 16 g 6  . ibuprofen (ADVIL,MOTRIN) 800 MG tablet TAKE 1 TABLET BY MOUTH EVERY 6-8 HOURS AS NEEDED FOR PAIN  0  . Magnesium 250 MG TABS Take 1 tablet by mouth daily.    . traMADol (ULTRAM) 50 MG tablet Take 50 mg by mouth every 6 (six) hours as needed. for pain  0  . vitamin E 400 UNIT capsule Take 400 Units by mouth daily.     No current facility-administered medications on file prior to visit.     Allergies  Allergen Reactions  . Hydrocodone Rash  . Demerol [Meperidine] Nausea And Vomiting    severe  . Sulfa Antibiotics Swelling and Rash    Past Medical History:  Diagnosis Date  . Anxiety   . Arthritis    HANDS,  KNEES  . Bone tumor (benign)    RIGHT FEMUR  . Borderline hypertension   . Female pelvic pain   . Fibromyalgia   . Left knee injury    TENDONDINITIS/  CHONDRAMALIA  . PONV (postoperative nausea and vomiting)     Past Surgical History:  Procedure Laterality Date  . DILATION AND CURETTAGE OF UTERUS  1986  &  1988   RETAINED PLACENTA  . LAPAROSCOPIC ASSISTED VAGINAL HYSTERECTOMY  01-25-2003  .  LAPAROSCOPY N/A 04/18/2014   Procedure: LAPAROSCOPY DIAGNOSTIC;  Surgeon: Margarette Asal, MD;  Location: [facility];  Service: Gynecology;  Laterality: N/A;  . NASAL SEPTUM SURGERY  AGE 83  . SALPINGOOPHORECTOMY Bilateral 04/18/2014   Procedure: SALPINGO OOPHORECTOMY;  Surgeon: Margarette Asal, MD;  Location: [facility];  Service: Gynecology;  Laterality: Bilateral;  . TONSILLECTOMY  AGE 12  . TUBAL LIGATION      Family History  Problem Relation Age of Onset  . Colon cancer Neg Hx   . Stomach cancer Neg Hx     Social History   Socioeconomic History  . Marital status: Married    Spouse name: Not on file  . Number of children: Not on file  . Years of education: Not on file  . Highest education level: Not on file  Occupational History  . Not on file  Social Needs  . Financial  resource strain: Not on file  . Food insecurity:    Worry: Not on file    Inability: Not on file  . Transportation needs:    Medical: Not on file    Non-medical: Not on file  Tobacco Use  . Smoking status: Current Every Day Smoker    Packs/day: 0.50    Years: 30.00    Pack years: 15.00    Types: Cigarettes  . Smokeless tobacco: Never Used  Substance and Sexual Activity  . Alcohol use: Yes    Comment: rare  . Drug use: No  . Sexual activity: Not on file  Lifestyle  . Physical activity:    Days per week: Not on file    Minutes per session: Not on file  . Stress: Not on file  Relationships  . Social connections:    Talks on phone: Not on file    Gets together: Not on file    Attends religious service: Not on file    Active member of club or organization: Not on file    Attends meetings of clubs or organizations: Not on file    Relationship status: Not on file  . Intimate partner violence:    Fear of current or ex partner: Not on file    Emotionally abused: Not on file    Physically abused: Not on file    Forced sexual activity: Not on file  Other Topics Concern  . Not on file  Social History Narrative  . Not on file   The PMH, PSH, Social History, Family History, Medications, and allergies have been reviewed in [facility], and have been updated if relevant.     Review of Systems  Constitutional: Negative.   HENT: Positive for dental problem, ear pain, facial swelling, sinus pressure and sinus pain. Negative for congestion, drooling, ear discharge, hearing loss, mouth sores and trouble swallowing.   Gastrointestinal: Negative.   All other systems reviewed and are negative.      Objective:    BP 126/78 (BP Location: Left Arm, Patient Position: Sitting, Cuff Size: Normal)   Pulse (!) 56   Temp 98.2 F (36.8 C) (Oral)   Ht 5\' 8"  (1.727 m)   Wt 165 lb 9.6 oz (75.1 kg)   SpO2 98%   BMI 25.18 kg/m    Physical Exam  Constitutional: She is oriented to person, place,  and time. She appears well-developed and well-nourished. No distress.  HENT:  Head: Normocephalic and atraumatic.  Right Ear: Hearing and tympanic membrane normal.  Left Ear: Hearing and tympanic membrane normal.  Nose: Right sinus exhibits maxillary sinus tenderness. Right sinus exhibits no frontal sinus tenderness. Left sinus exhibits no maxillary sinus tenderness and no frontal sinus tenderness.  Cardiovascular: Normal rate.  Pulmonary/Chest: Effort normal.  Lymphadenopathy:    She has no cervical adenopathy.  Neurological: She is alert and oriented to person, place, and time. No cranial nerve deficit. Coordination normal.  Skin: Skin is warm and dry. She is not diaphoretic.  Psychiatric: She has a normal mood and affect. Her behavior is normal. Judgment and thought content normal.  Nursing note and vitals reviewed.         Assessment & Plan:   Acute maxillary sinusitis, recurrence not specified - Plan: methylPREDNISolone acetate (DEPO-MEDROL) injection 80 mg  Pain, dental  Sinus pressure No follow-ups on file.

## 2018-05-28 NOTE — Assessment & Plan Note (Signed)
Has been on multiple days of amoxicillin and now augmentin (still taking) so this does not seem consistent with a bacterial sinus infection.  More likely due to dental procedures and pain.  Advised to call endodontist ASAP, finish course of abx. Given IM depomedrol to help with swelling and short course of oral prednisone to start in next 1-2 days if pain is persisting. Call or return to clinic prn if these symptoms worsen or fail to improve as anticipated. The patient indicates understanding of these issues and agrees with the plan.

## 2018-06-03 ENCOUNTER — Ambulatory Visit: Payer: Self-pay | Admitting: Rheumatology

## 2018-07-06 ENCOUNTER — Ambulatory Visit: Payer: Self-pay | Admitting: Rheumatology

## 2018-10-01 ENCOUNTER — Ambulatory Visit: Payer: Self-pay | Admitting: Family Medicine

## 2018-10-01 ENCOUNTER — Encounter: Payer: Self-pay | Admitting: Family Medicine

## 2018-10-01 VITALS — BP 124/80 | HR 76 | Temp 97.5°F | Ht 68.0 in | Wt 165.2 lb

## 2018-10-01 DIAGNOSIS — Z23 Encounter for immunization: Secondary | ICD-10-CM

## 2018-10-01 DIAGNOSIS — J301 Allergic rhinitis due to pollen: Secondary | ICD-10-CM

## 2018-10-01 MED ORDER — PREDNISONE 20 MG PO TABS: 20.0000 mg | ORAL_TABLET | Freq: Two times a day (BID) | ORAL | 0 refills | Status: AC

## 2018-10-01 NOTE — Progress Notes (Signed)
Subjective:  Patient ID: Trinna Balloon, female    DOB: 06-24-1956  Age: 62 y.o. MRN: [id number]  CC: Neck Pain (right side of neck); Allergies; and Facial Pain   HPI KRISI AZUA presents for sinus pain, postnasal drip, itching in her ears nose and throat.  There is been no fever or chills nausea or vomiting.  She is having discomfort in the right anterior neck area.  There is been no cough wheezing or shortness of breath.  She is having pain in her molars on the lower right side.  Her dentist is recommended crowning.  She has taken 3 rounds of antibiotic recently dental abscesses.  She just finished her last round last week.  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 Outpatient Medications Prior to Visit  Medication Sig Dispense Refill  . ALPRAZolam (XANAX) 0.25 MG tablet Take 1 tablet (0.25 mg total) by mouth daily as needed. (Patient taking differently: Take 0.25 mg by mouth daily as needed for anxiety. ) 30 tablet 2  . calcium carbonate (OS-CAL) 600 MG TABS Take 1 tablet by mouth 2 (two) times daily. Bone-Up Supp.    . Cholecalciferol (VITAMIN D3) 2000 UNITS TABS Take 1 capsule by mouth daily.    . fluticasone (FLONASE) 50 MCG/ACT nasal spray Place 2 sprays into both nostrils daily. 16 g 6  . Magnesium 250 MG TABS Take 1 tablet by mouth daily.    . vitamin E 400 UNIT capsule Take 400 Units by mouth daily.    . predniSONE (DELTASONE) 10 MG tablet 3 tabs by mouth x 3 days, 2 tabs by mouth x 2 days, 1 tab by mouth x 2 days and stop. 15 tablet 0   No facility-administered medications prior to visit.     ROS Review of Systems  Constitutional: Negative for chills, diaphoresis, fatigue and fever.  HENT: Positive for dental problem, facial swelling, postnasal drip, sinus pain and sneezing. Negative for rhinorrhea, sinus pressure, trouble swallowing and voice change.   Eyes: Negative for photophobia and visual disturbance.  Respiratory: Negative for cough, chest tightness and  wheezing.   Cardiovascular: Negative.   Gastrointestinal: Negative.   Endocrine: Negative for polyphagia and polyuria.  Musculoskeletal: Positive for arthralgias. Negative for joint swelling.  Skin: Negative for pallor and rash.  Allergic/Immunologic: Negative for immunocompromised state.  Neurological: Negative for light-headedness and numbness.  Hematological: Does not bruise/bleed easily.  Psychiatric/Behavioral: Negative.     Objective:  BP 124/80   Pulse 76   Temp (!) 97.5 F (36.4 C) (Oral)   Ht 5\' 8"  (1.727 m)   Wt 165 lb 4 oz (75 kg)   SpO2 96%   BMI 25.13 kg/m   BP Readings from Last 3 Encounters:  10/01/18 124/80  05/28/18 126/78  02/11/18 126/74    Wt Readings from Last 3 Encounters:  10/01/18 165 lb 4 oz (75 kg)  05/28/18 165 lb 9.6 oz (75.1 kg)  02/11/18 168 lb (76.2 kg)    Physical Exam  Constitutional: She appears well-developed and well-nourished. No distress.  HENT:  Head: Normocephalic and atraumatic.    Right Ear: External ear normal.  Left Ear: External ear normal.  Mouth/Throat: Oropharynx is clear and moist. No oropharyngeal exudate.  Eyes: Pupils are equal, round, and reactive to light. Conjunctivae and EOM are normal. Right eye exhibits no discharge. Left eye exhibits no discharge. No scleral icterus.  Neck: Normal range of motion. Neck supple. No JVD present. No tracheal deviation present.  No thyromegaly present.  Cardiovascular: Normal rate, regular rhythm and normal heart sounds.  Pulmonary/Chest: Effort normal and breath sounds normal.  Lymphadenopathy:    She has no cervical adenopathy.  Skin: Skin is warm and dry. She is not diaphoretic.  Psychiatric: She has a normal mood and affect. Her behavior is normal.    Lab Results  Component Value Date   WBC 8.3 01/25/2018   HGB 14.3 01/25/2018   HCT 42.1 01/25/2018   PLT 305 01/25/2018   GLUCOSE 147 (H) 01/25/2018   CHOL 205 (H) 11/19/2017   TRIG 188.0 (H) 11/19/2017   HDL 46.30  11/19/2017   LDLCALC 121 (H) 11/19/2017   ALT 18 11/19/2017   AST 14 11/19/2017   NA 140 01/25/2018   K 3.7 01/25/2018   CL 106 01/25/2018   CREATININE 0.69 01/25/2018   BUN 14 01/25/2018   CO2 22 01/25/2018   TSH 1.95 11/19/2017    Dg Chest 2 View  Result Date: 01/25/2018 CLINICAL DATA:  Acute onset of high blood pressure.  Nausea. EXAM: CHEST  2 VIEW COMPARISON:  None. FINDINGS: The lungs are well-aerated and clear. There is no evidence of focal opacification, pleural effusion or pneumothorax. The heart is normal in size; the mediastinal contour is within normal limits. No acute osseous abnormalities are seen. IMPRESSION: No acute cardiopulmonary process seen. Electronically Signed   By: Garald Balding M.D.   On: 01/25/2018 02:49    Assessment & Plan:   Damaya was seen today for neck pain, allergies and facial pain.  Diagnoses and all orders for this visit:  Seasonal allergic rhinitis due to pollen -     predniSONE (DELTASONE) 20 MG tablet; Take 1 tablet (20 mg total) by mouth 2 (two) times daily with a meal for 7 days.   I have discontinued Xylah S. Ravenscroft "Sherlene"'s predniSONE. I am also having her start on predniSONE. Additionally, I am having her maintain her calcium carbonate, Magnesium, Vitamin D3, vitamin E, ALPRAZolam, and fluticasone.  Meds ordered this encounter  Medications  . predniSONE (DELTASONE) 20 MG tablet    Sig: Take 1 tablet (20 mg total) by mouth 2 (two) times daily with a meal for 7 days.    Dispense:  14 tablet    Refill:  0   Patient will use her Flonase daily.  She is aware that it takes at least a week for that medication to become effective.  Follow-up: Return if symptoms worsen or fail to improve, for be sure to use flonase daily.  Libby Maw, MD

## 2018-10-16 ENCOUNTER — Telehealth: Payer: Self-pay | Admitting: Family Medicine

## 2018-10-16 DIAGNOSIS — J301 Allergic rhinitis due to pollen: Secondary | ICD-10-CM

## 2018-10-16 MED ORDER — CHLORPHEN-PE-ACETAMINOPHEN 4-10-325 MG PO TABS: ORAL_TABLET | ORAL | 0 refills | Status: DC

## 2018-10-16 NOTE — Telephone Encounter (Signed)
Copied from [place] [id number]. Topic: General - Other >> Oct 16, 2018  3:50 PM Virl Axe D wrote: Reason for CRM: Pt saw Dr. Ethelene Hal on 10/01/18 / Requesting prescription for Norell for Sinus relief. Please advise  [facility] [address] [place] - [place] [phone] (Phone) [phone] (Fax)

## 2018-10-16 NOTE — Telephone Encounter (Signed)
I left a voicemail for patient letting her know that the medication has been sent in.

## 2018-10-16 NOTE — Telephone Encounter (Signed)
Couldn't find norell. Patient needs to use her flonase and salt water nose spray.

## 2018-10-16 NOTE — Telephone Encounter (Signed)
Done

## 2018-12-16 HISTORY — PX: OTHER SURGICAL HISTORY: SHX169

## 2019-01-18 ENCOUNTER — Encounter: Payer: Self-pay | Admitting: Family Medicine

## 2019-01-18 ENCOUNTER — Ambulatory Visit (INDEPENDENT_AMBULATORY_CARE_PROVIDER_SITE_OTHER): Payer: BLUE CROSS/BLUE SHIELD | Admitting: Family Medicine

## 2019-01-18 DIAGNOSIS — J069 Acute upper respiratory infection, unspecified: Secondary | ICD-10-CM | POA: Diagnosis not present

## 2019-01-18 NOTE — Progress Notes (Signed)
[name] [name] - 63 y.o. female MRN [id number]  Date of birth: 1956/06/26  Subjective Chief Complaint  Patient presents with  . Pain    pain in left ear, nasal congestion,headache, sinus pressure, diarrhea/ 5 days/ OTC flonase    HPI  [name] [name] is a 63 y.o. female who complains of coryza, congestion, sore throat, post nasal drip, ear pain and diarrhea for 5 days.  Had GI illness about 2 weeks ago as well. Symptoms from current illness seem to be slowly improving.   She denies a history of chest pain, dizziness, fatigue, fevers, myalgias, shortness of breath, vomiting, wheezing and cough and denies a history of asthma. Patient does smoke cigarettes.  ROS:  A comprehensive ROS was completed and negative except as noted per HPI       Allergies  Allergen Reactions  . Hydrocodone Rash  . Demerol [Meperidine] Nausea And Vomiting    severe  . Sulfa Antibiotics Swelling and Rash    Past Medical History:  Diagnosis Date  . Anxiety   . Arthritis    HANDS,  KNEES  . Bone tumor (benign)    RIGHT FEMUR  . Borderline hypertension   . Female pelvic pain   . Fibromyalgia   . Left knee injury    TENDONDINITIS/  CHONDRAMALIA  . PONV (postoperative nausea and vomiting)     Past Surgical History:  Procedure Laterality Date  . DILATION AND CURETTAGE OF UTERUS  1986  &  1988   RETAINED PLACENTA  . LAPAROSCOPIC ASSISTED VAGINAL HYSTERECTOMY  01-25-2003  . LAPAROSCOPY N/A 04/18/2014   Procedure: LAPAROSCOPY DIAGNOSTIC;  Surgeon: Margarette Asal, MD;  Location: [facility];  Service: Gynecology;  Laterality: N/A;  . NASAL SEPTUM SURGERY  AGE 61  . SALPINGOOPHORECTOMY Bilateral 04/18/2014   Procedure: SALPINGO OOPHORECTOMY;  Surgeon: Margarette Asal, MD;  Location: [facility];  Service: Gynecology;  Laterality: Bilateral;  . TONSILLECTOMY  AGE 59  . TUBAL LIGATION      Social History   Socioeconomic History  . Marital status: Married    Spouse  name: Not on file  . Number of children: Not on file  . Years of education: Not on file  . Highest education level: Not on file  Occupational History  . Not on file  Social Needs  . Financial resource strain: Not on file  . Food insecurity:    Worry: Not on file    Inability: Not on file  . Transportation needs:    Medical: Not on file    Non-medical: Not on file  Tobacco Use  . Smoking status: Current Every Day Smoker    Packs/day: 0.50    Years: 30.00    Pack years: 15.00    Types: Cigarettes  . Smokeless tobacco: Never Used  Substance and Sexual Activity  . Alcohol use: Yes    Comment: rare  . Drug use: No  . Sexual activity: Not on file  Lifestyle  . Physical activity:    Days per week: Not on file    Minutes per session: Not on file  . Stress: Not on file  Relationships  . Social connections:    Talks on phone: Not on file    Gets together: Not on file    Attends religious service: Not on file    Active member of club or organization: Not on file    Attends meetings of clubs or organizations: Not on file  Relationship status: Not on file  Other Topics Concern  . Not on file  Social History Narrative  . Not on file    Family History  Problem Relation Age of Onset  . Colon cancer Neg Hx   . Stomach cancer Neg Hx     Health Maintenance  Topic Date Due  . HIV Screening  12/30/1972  . PAP SMEAR-Modifier  01/13/2018  . MAMMOGRAM  04/24/2019  . COLONOSCOPY  10/14/2022  . TETANUS/TDAP  11/20/2027  . INFLUENZA VACCINE  Completed  . Hepatitis C Screening  Completed    ----------------------------------------------------------------------------------------------------------------------------------------------------------------------------------------------------------------- Physical Exam BP 126/82   Pulse 69   Temp 97.9 F (36.6 C) (Oral)   Ht 5\' 8"  (1.727 m)   Wt 164 lb (74.4 kg)   SpO2 97%   BMI 24.94 kg/m   Physical Exam Constitutional:       Appearance: Normal appearance.  HENT:     Head: Normocephalic and atraumatic.     Left Ear: Tympanic membrane normal.     Ears:     Comments: Serous effusion R TM    Mouth/Throat:     Mouth: Mucous membranes are moist.  Eyes:     General: No scleral icterus. Neck:     Musculoskeletal: Normal range of motion and neck supple.  Cardiovascular:     Rate and Rhythm: Normal rate and regular rhythm.  Pulmonary:     Effort: Pulmonary effort is normal.     Breath sounds: Normal breath sounds.  Skin:    General: Skin is warm and dry.  Neurological:     General: No focal deficit present.     Mental Status: She is alert.  Psychiatric:        Mood and Affect: Mood normal.        Behavior: Behavior normal.     ------------------------------------------------------------------------------------------------------------------------------------------------------------------------------------------------------------------- Assessment and Plan  URI (upper respiratory infection)  Symptomatic therapy suggested: push fluids, rest, gargle warm salt water, use vaporizer or mist prn and return office visit prn if symptoms persist or worsen. Lack of antibiotic effectiveness discussed with her. Call or return to clinic prn if these symptoms worsen or fail to improve as anticipated.

## 2019-01-18 NOTE — Assessment & Plan Note (Signed)
  Symptomatic therapy suggested: push fluids, rest, gargle warm salt water, use vaporizer or mist prn and return office visit prn if symptoms persist or worsen. Lack of antibiotic effectiveness discussed with her. Call or return to clinic prn if these symptoms worsen or fail to improve as anticipated.

## 2019-01-18 NOTE — Patient Instructions (Signed)
Viral Illness, Adult °Viruses are tiny germs that can get into a person's body and cause illness. There are many different types of viruses, and they cause many types of illness. Viral illnesses can range from mild to severe. They can affect various parts of the body. °Common illnesses that are caused by a virus include colds and the flu. Viral illnesses also include serious conditions such as HIV/AIDS (human immunodeficiency virus/acquired immunodeficiency syndrome). A few viruses have been linked to certain cancers. °What are the causes? °Many types of viruses can cause illness. Viruses invade cells in your body, multiply, and cause the infected cells to malfunction or die. When the cell dies, it releases more of the virus. When this happens, you develop symptoms of the illness, and the virus continues to spread to other cells. If the virus takes over the function of the cell, it can cause the cell to divide and grow out of control, as is the case when a virus causes cancer. °Different viruses get into the body in different ways. You can get a virus by: °· Swallowing food or water that is contaminated with the virus. °· Breathing in droplets that have been coughed or sneezed into the air by an infected person. °· Touching a surface that has been contaminated with the virus and then touching your eyes, nose, or mouth. °· Being bitten by an insect or animal that carries the virus. °· Having sexual contact with a person who is infected with the virus. °· Being exposed to blood or fluids that contain the virus, either through an open cut or during a transfusion. °If a virus enters your body, your body's defense system (immune system) will try to fight the virus. You may be at higher risk for a viral illness if your immune system is weak. °What are the signs or symptoms? °Symptoms vary depending on the type of virus and the location of the cells that it invades. Common symptoms of the main types of viral illnesses  include: °Cold and flu viruses °· Fever. °· Headache. °· Sore throat. °· Muscle aches. °· Nasal congestion. °· Cough. °Digestive system (gastrointestinal) viruses °· Fever. °· Abdominal pain. °· Nausea. °· Diarrhea. °Liver viruses (hepatitis) °· Loss of appetite. °· Tiredness. °· Yellowing of the skin (jaundice). °Brain and spinal cord viruses °· Fever. °· Headache. °· Stiff neck. °· Nausea and vomiting. °· Confusion or sleepiness. °Skin viruses °· Warts. °· Itching. °· Rash. °Sexually transmitted viruses °· Discharge. °· Swelling. °· Redness. °· Rash. °How is this treated? °Viruses can be difficult to treat because they live within cells. Antibiotic medicines do not treat viruses because these drugs do not get inside cells. Treatment for a viral illness may include: °· Resting and drinking plenty of fluids. °· Medicines to relieve symptoms. These can include over-the-counter medicine for pain and fever, medicines for cough or congestion, and medicines to relieve diarrhea. °· Antiviral medicines. These drugs are available only for certain types of viruses. They may help reduce flu symptoms if taken early. There are also many antiviral medicines for hepatitis and HIV/AIDS. °Some viral illnesses can be prevented with vaccinations. A common example is the flu shot. °Follow these instructions at home: °Medicines ° °· Take over-the-counter and prescription medicines only as told by your health care provider. °· If you were prescribed an antiviral medicine, take it as told by your health care provider. Do not stop taking the medicine even if you start to feel better. °· Be aware of when   antibiotics are needed and when they are not needed. Antibiotics do not treat viruses. If your health care provider thinks that you may have a bacterial infection as well as a viral infection, you may get an antibiotic. °? Do not ask for an antibiotic prescription if you have been diagnosed with a viral illness. That will not make your  illness go away faster. °? Frequently taking antibiotics when they are not needed can lead to antibiotic resistance. When this develops, the medicine no longer works against the bacteria that it normally fights. °General instructions °· Drink enough fluids to keep your urine clear or pale yellow. °· Rest as much as possible. °· Return to your normal activities as told by your health care provider. Ask your health care provider what activities are safe for you. °· Keep all follow-up visits as told by your health care provider. This is important. °How is this prevented? °Take these actions to reduce your risk of viral infection: °· Eat a healthy diet and get enough rest. °· Wash your hands often with soap and water. This is especially important when you are in public places. If soap and water are not available, use hand sanitizer. °· Avoid close contact with friends and family who have a viral illness. °· If you travel to areas where viral gastrointestinal infection is common, avoid drinking water or eating raw food. °· Keep your immunizations up to date. Get a flu shot every year as told by your health care provider. °· Do not share toothbrushes, nail clippers, razors, or needles with other people. °· Always practice safe sex. ° °Contact a health care provider if: °· You have symptoms of a viral illness that do not go away. °· Your symptoms come back after going away. °· Your symptoms get worse. °Get help right away if: °· You have trouble breathing. °· You have a severe headache or a stiff neck. °· You have severe vomiting or abdominal pain. °This information is not intended to replace advice given to you by your health care provider. Make sure you discuss any questions you have with your health care provider. °Document Released: 04/12/2016 Document Revised: 05/15/2016 Document Reviewed: 04/12/2016 °Elsevier Interactive Patient Education © 2019 [employer] ° °

## 2019-03-01 ENCOUNTER — Other Ambulatory Visit: Payer: Self-pay | Admitting: Obstetrics and Gynecology

## 2019-03-01 DIAGNOSIS — N644 Mastodynia: Secondary | ICD-10-CM

## 2019-03-01 DIAGNOSIS — N6459 Other signs and symptoms in breast: Secondary | ICD-10-CM | POA: Diagnosis not present

## 2019-05-31 ENCOUNTER — Encounter: Payer: Self-pay | Admitting: Family Medicine

## 2019-05-31 ENCOUNTER — Telehealth: Payer: Self-pay | Admitting: Family Medicine

## 2019-05-31 ENCOUNTER — Ambulatory Visit (INDEPENDENT_AMBULATORY_CARE_PROVIDER_SITE_OTHER): Payer: BC Managed Care – PPO | Admitting: Family Medicine

## 2019-05-31 DIAGNOSIS — M7989 Other specified soft tissue disorders: Secondary | ICD-10-CM | POA: Insufficient documentation

## 2019-05-31 DIAGNOSIS — M79661 Pain in right lower leg: Secondary | ICD-10-CM | POA: Insufficient documentation

## 2019-05-31 NOTE — Telephone Encounter (Signed)
Copied from [place] [id number]. Topic: Quick Communication - See Telephone Encounter >> May 31, 2019  3:24 PM Berneta Levins wrote: CRM for notification. See Telephone encounter for: 05/31/19.  Pt has decided not to do biopsy at this time.  States that she is more concerned about possible DVT and wants to know if she needs to go ahead on Asprin regiment. Pt can be reached at [phone] or [phone]

## 2019-05-31 NOTE — Assessment & Plan Note (Signed)
New- unable to place her on ASA since she is scheduled for surgery this week.  Likely phlebitis but I cannot rule out DVT. Doppler ultrasound ordered for further evaluation. The patient indicates understanding of these issues and agrees with the plan.

## 2019-05-31 NOTE — Patient Instructions (Signed)
We are ordering an [place] of your right leg.  I would start you on aspirin but you have a biospy coming up.  Please update me after your biopsy.

## 2019-05-31 NOTE — Progress Notes (Signed)
Subjective:   Patient ID: [name] [name], female    DOB: 04-Nov-1956, 63 y.o.   MRN: [id number]  [name] [name] is a pleasant 63 y.o. year old female who presents to clinic today with Leg Problem (Pt is here today C/O right calf with a small spot but the calf hurts and stings.  Noticed the spot on Friday but felt the burning and stinging  intermittently x3wks. )  on 05/31/2019  HPI:  Knot on right calf-  Noticed right calf stinging and burning intermittently for 3 weeks.  2 days ago, noted a raised, painful to touch area on her right calf.  Not red or warm.  Feels it moves sometimes.  No streaking.  No fevers or chills.  Has not tried anything for it.  Has been doing exercises in her pool to help with her fibromyalgia.  No CP or SOB.  No recent travel.  She is a current smoker.  Current Outpatient Medications on File Prior to Visit  Medication Sig Dispense Refill  . ALPRAZolam (XANAX) 0.25 MG tablet Take 1 tablet (0.25 mg total) by mouth daily as needed. (Patient taking differently: Take 0.25 mg by mouth daily as needed for anxiety. ) 30 tablet 2  . calcium carbonate (OS-CAL) 600 MG TABS Take 1 tablet by mouth 2 (two) times daily. Bone-Up Supp.    . Cholecalciferol (VITAMIN D3) 2000 UNITS TABS Take 1 capsule by mouth daily.    . fluticasone (FLONASE) 50 MCG/ACT nasal spray Place 2 sprays into both nostrils daily. 16 g 6  . Magnesium 250 MG TABS Take 1 tablet by mouth daily.    . vitamin E 400 UNIT capsule Take 400 Units by mouth daily.    . Chlorphen-PE-Acetaminophen (NOREL AD) 4-10-325 MG TABS Take 1-2 tablets every 8 hours as needed. (Patient not taking: Reported on 05/31/2019) 84 tablet 0   No current facility-administered medications on file prior to visit.     Allergies  Allergen Reactions  . Hydrocodone Rash  . Demerol [Meperidine] Nausea And Vomiting    severe  . Sulfa Antibiotics Swelling and Rash    Past Medical History:  Diagnosis Date  . Anxiety   . Arthritis    HANDS,  KNEES  . Bone tumor (benign)    RIGHT FEMUR  . Borderline hypertension   . Female pelvic pain   . Fibromyalgia   . Left knee injury    TENDONDINITIS/  CHONDRAMALIA  . PONV (postoperative nausea and vomiting)     Past Surgical History:  Procedure Laterality Date  . DILATION AND CURETTAGE OF UTERUS  1986  &  1988   RETAINED PLACENTA  . LAPAROSCOPIC ASSISTED VAGINAL HYSTERECTOMY  01-25-2003  . LAPAROSCOPY N/A 04/18/2014   Procedure: LAPAROSCOPY DIAGNOSTIC;  Surgeon: Margarette Asal, MD;  Location: [facility];  Service: Gynecology;  Laterality: N/A;  . NASAL SEPTUM SURGERY  AGE 32  . SALPINGOOPHORECTOMY Bilateral 04/18/2014   Procedure: SALPINGO OOPHORECTOMY;  Surgeon: Margarette Asal, MD;  Location: [facility];  Service: Gynecology;  Laterality: Bilateral;  . TONSILLECTOMY  AGE 31  . TUBAL LIGATION      Family History  Problem Relation Age of Onset  . Colon cancer Neg Hx   . Stomach cancer Neg Hx     Social History   Socioeconomic History  . Marital status: Married    Spouse name: Not on file  . Number of children: Not on file  . Years of education: Not  on file  . Highest education level: Not on file  Occupational History  . Not on file  Social Needs  . Financial resource strain: Not on file  . Food insecurity    Worry: Not on file    Inability: Not on file  . Transportation needs    Medical: Not on file    Non-medical: Not on file  Tobacco Use  . Smoking status: Current Every Day Smoker    Packs/day: 0.50    Years: 30.00    Pack years: 15.00    Types: Cigarettes  . Smokeless tobacco: Never Used  Substance and Sexual Activity  . Alcohol use: Yes    Comment: rare  . Drug use: No  . Sexual activity: Not on file  Lifestyle  . Physical activity    Days per week: Not on file    Minutes per session: Not on file  . Stress: Not on file  Relationships  . Social Herbalist on phone: Not on file    Gets together:  Not on file    Attends religious service: Not on file    Active member of club or organization: Not on file    Attends meetings of clubs or organizations: Not on file    Relationship status: Not on file  . Intimate partner violence    Fear of current or ex partner: Not on file    Emotionally abused: Not on file    Physically abused: Not on file    Forced sexual activity: Not on file  Other Topics Concern  . Not on file  Social History Narrative  . Not on file     Review of Systems  Constitutional: Negative.   HENT: Negative.   Eyes: Negative.   Respiratory: Negative for shortness of breath.   Cardiovascular: Negative.   Gastrointestinal: Negative.   Endocrine: Negative.   Musculoskeletal: Positive for myalgias.  Skin: Negative.   Allergic/Immunologic: Negative.   Neurological: Negative for dizziness, tremors, seizures, syncope, facial asymmetry, speech difficulty, weakness, light-headedness, numbness and headaches.  Hematological: Negative for adenopathy. Does not bruise/bleed easily.  Psychiatric/Behavioral: Negative.   All other systems reviewed and are negative.      Objective:    BP (!) 166/90   Pulse 62   Temp 98.3 F (36.8 C) (Oral)   Ht 5\' 8"  (1.727 m)   Wt 165 lb 9.6 oz (75.1 kg)   SpO2 98%   BMI 25.18 kg/m   BP Readings from Last 3 Encounters:  05/31/19 (!) 166/90  01/18/19 126/82  10/01/18 124/80    Physical Exam Vitals signs and nursing note reviewed.  Constitutional:      General: She is not in acute distress.    Appearance: Normal appearance. She is not ill-appearing, toxic-appearing or diaphoretic.  HENT:     Head: Normocephalic and atraumatic.     Right Ear: External ear normal.     Left Ear: External ear normal.     Nose: Nose normal.     Mouth/Throat:     Mouth: Mucous membranes are moist.  Eyes:     Extraocular Movements: Extraocular movements intact.  Neck:     Musculoskeletal: Normal range of motion.  Cardiovascular:     Rate  and Rhythm: Normal rate and regular rhythm.     Pulses: Normal pulses.     Heart sounds: Normal heart sounds.  Pulmonary:     Effort: Pulmonary effort is normal.     Breath sounds: Normal  breath sounds.  Musculoskeletal:     Comments: Small painful subcutaneous lump right calf  Skin:    General: Skin is warm and dry.     Findings: No erythema.  Neurological:     General: No focal deficit present.     Mental Status: She is alert and oriented to person, place, and time.  Psychiatric:        Mood and Affect: Mood normal.        Behavior: Behavior normal.        Thought Content: Thought content normal.           Assessment & Plan:   Tenderness of right calf - Plan:   Calf swelling - Plan:  No follow-ups on file.

## 2019-06-01 ENCOUNTER — Other Ambulatory Visit: Payer: Self-pay

## 2019-06-01 ENCOUNTER — Ambulatory Visit (HOSPITAL_COMMUNITY)
Admission: RE | Admit: 2019-06-01 | Discharge: 2019-06-01 | Disposition: A | Discharge status: Home / Self Care | Payer: BC Managed Care – PPO | Source: Ambulatory Visit | Attending: Family Medicine | Admitting: Family Medicine

## 2019-06-01 ENCOUNTER — Telehealth: Payer: Self-pay | Admitting: Family Medicine

## 2019-06-01 DIAGNOSIS — M7989 Other specified soft tissue disorders: Secondary | ICD-10-CM | POA: Diagnosis not present

## 2019-06-01 DIAGNOSIS — M79661 Pain in right lower leg: Secondary | ICD-10-CM | POA: Diagnosis not present

## 2019-06-01 MED ORDER — ASPIRIN EC 81 MG PO TBEC: 81.0000 mg | DELAYED_RELEASE_TABLET | Freq: Every day | ORAL | Status: DC

## 2019-06-01 NOTE — Telephone Encounter (Signed)
Alecia, Vascular Sonographer with [facility] called a report of the Right extremity doppler, negative for DVT and the right small saphenous vein is negative for DVT. She says the patient is there and what to do with her, I advised since the results are negative and the office is closed to go ahead and send her home. She says she's typing up the results for epic now.

## 2019-06-01 NOTE — Telephone Encounter (Signed)
Yes 81 mg of EC ASA daily.  It's oTC but I will add it to her med list.

## 2019-06-01 NOTE — Telephone Encounter (Signed)
TA-Plz see note below/she states that she does not want to do Bx at this time/is more concerned about possible DVT/wants to know if should start an asa regimen/plz advise/thx dmf

## 2019-06-02 NOTE — Telephone Encounter (Signed)
Pt aware/thx dmf 

## 2019-06-03 ENCOUNTER — Other Ambulatory Visit: Payer: Self-pay | Admitting: Family Medicine

## 2019-06-04 NOTE — Telephone Encounter (Signed)
TA-LOV: 6.15.20/LF: 4.29.19/Per [place] pt is compliant without red flags/thx dmf

## 2019-06-16 ENCOUNTER — Telehealth: Payer: Self-pay

## 2019-06-16 NOTE — Telephone Encounter (Signed)
Copied from [place] [phone]. Topic: General - Other >> Jun 16, 2019 10:55 AM Celene Kras A wrote: Reason for CRM: Pt called and is requesting to get a call back regardign some medications and her oral surgery. Please advise.

## 2019-06-22 DIAGNOSIS — K048 Radicular cyst: Secondary | ICD-10-CM | POA: Diagnosis not present

## 2019-06-23 NOTE — Telephone Encounter (Signed)
Left pt VM to call back in regards to message below.

## 2019-07-12 DIAGNOSIS — Z13820 Encounter for screening for osteoporosis: Secondary | ICD-10-CM | POA: Diagnosis not present

## 2019-07-12 DIAGNOSIS — Z1231 Encounter for screening mammogram for malignant neoplasm of breast: Secondary | ICD-10-CM | POA: Diagnosis not present

## 2019-07-12 DIAGNOSIS — Z6825 Body mass index (BMI) 25.0-25.9, adult: Secondary | ICD-10-CM | POA: Diagnosis not present

## 2019-07-12 DIAGNOSIS — Z01419 Encounter for gynecological examination (general) (routine) without abnormal findings: Secondary | ICD-10-CM | POA: Diagnosis not present

## 2019-07-12 LAB — HM DEXA SCAN

## 2019-07-16 ENCOUNTER — Telehealth: Payer: Self-pay | Admitting: Family Medicine

## 2019-07-16 DIAGNOSIS — M797 Fibromyalgia: Secondary | ICD-10-CM

## 2019-07-16 DIAGNOSIS — E785 Hyperlipidemia, unspecified: Secondary | ICD-10-CM

## 2019-07-16 DIAGNOSIS — I10 Essential (primary) hypertension: Secondary | ICD-10-CM

## 2019-07-16 NOTE — Telephone Encounter (Signed)
Pt called and was wanting to get a Physical done but Dr Deborra Medina is backed up until early September at the moment and the pt said that was to long but that she was going to try and see if [facility] can get [facility] in sooner and if not she will give [place] a call back

## 2019-08-02 NOTE — Telephone Encounter (Signed)
Pt called stating she is still needing her blood work done.Pt is requesting an update. Please advise.

## 2019-08-03 NOTE — Telephone Encounter (Signed)
[name] called wanting to get CPE but cannot be seen until September but still is requesting blood work/what would you like ordered?Plz advise/thx dmf

## 2019-08-06 NOTE — Telephone Encounter (Signed)
Pt want to know the status of her labs and appt she need to have. Please advise

## 2019-08-10 NOTE — Telephone Encounter (Signed)
I called and left message on patient voicemail to call office and schedule appointment for labs.

## 2019-08-10 NOTE — Telephone Encounter (Signed)
Is she asking for a lab appointment?  I am not sure I follow.

## 2019-08-10 NOTE — Telephone Encounter (Signed)
Can we get pt scheduled for a lab appt? Thank you!

## 2019-08-10 NOTE — Telephone Encounter (Signed)
Sure okay to make a lab visit for her and I will placed orders.  Thank you.

## 2019-08-10 NOTE — Telephone Encounter (Signed)
She is wanting to get physical blood work done before her physical. She can't be seen until September, but would like to get her blood work done before the appt.

## 2019-08-10 NOTE — Addendum Note (Signed)
Addended by: Lucille Passy on: 08/10/2019 09:01 AM   Modules accepted: Orders

## 2019-08-12 ENCOUNTER — Other Ambulatory Visit: Payer: Self-pay

## 2019-08-12 ENCOUNTER — Other Ambulatory Visit (INDEPENDENT_AMBULATORY_CARE_PROVIDER_SITE_OTHER): Payer: BC Managed Care – PPO

## 2019-08-12 ENCOUNTER — Encounter: Payer: Self-pay | Admitting: Family Medicine

## 2019-08-12 DIAGNOSIS — E785 Hyperlipidemia, unspecified: Secondary | ICD-10-CM | POA: Diagnosis not present

## 2019-08-12 DIAGNOSIS — M797 Fibromyalgia: Secondary | ICD-10-CM | POA: Diagnosis not present

## 2019-08-12 DIAGNOSIS — I10 Essential (primary) hypertension: Secondary | ICD-10-CM

## 2019-08-12 LAB — COMPREHENSIVE METABOLIC PANEL
ALT: 19 U/L (ref 0–35)
AST: 16 U/L (ref 0–37)
Albumin: 4.4 g/dL (ref 3.5–5.2)
Alkaline Phosphatase: 84 U/L (ref 39–117)
BUN: 20 mg/dL (ref 6–23)
CO2: 27 mEq/L (ref 19–32)
Calcium: 9.7 mg/dL (ref 8.4–10.5)
Chloride: 107 mEq/L (ref 96–112)
Creatinine, Ser: 0.73 mg/dL (ref 0.40–1.20)
GFR: 80.88 mL/min (ref >60.00)
Glucose, Bld: 103 mg/dL — ABNORMAL HIGH (ref 70–99)
Potassium: 4 mEq/L (ref 3.5–5.1)
Sodium: 141 mEq/L (ref 135–145)
Total Bilirubin: 0.5 mg/dL (ref 0.2–1.2)
Total Protein: 7 g/dL (ref 6.0–8.3)

## 2019-08-12 LAB — CBC WITH DIFFERENTIAL/PLATELET
Basophils Absolute: 0.1 10*3/uL (ref 0.0–0.1)
Basophils Relative: 1.2 % (ref 0.0–3.0)
Eosinophils Absolute: 0.6 10*3/uL (ref 0.0–0.7)
Eosinophils Relative: 7.6 % — ABNORMAL HIGH (ref 0.0–5.0)
HCT: 44.7 % (ref 36.0–46.0)
Hemoglobin: 14.7 g/dL (ref 12.0–15.0)
Lymphocytes Relative: 23 % (ref 12.0–46.0)
Lymphs Abs: 1.7 10*3/uL (ref 0.7–4.0)
MCHC: 32.9 g/dL (ref 30.0–36.0)
MCV: 93.8 fl (ref 78.0–100.0)
Monocytes Absolute: 0.5 10*3/uL (ref 0.1–1.0)
Monocytes Relative: 6.6 % (ref 3.0–12.0)
Neutro Abs: 4.6 10*3/uL (ref 1.4–7.7)
Neutrophils Relative %: 61.6 % (ref 43.0–77.0)
Platelets: 307 10*3/uL (ref 150.0–400.0)
RBC: 4.76 Mil/uL (ref 3.87–5.11)
RDW: 13.6 % (ref 11.5–15.5)
WBC: 7.4 10*3/uL (ref 4.0–10.5)

## 2019-08-12 LAB — LIPID PANEL
Cholesterol: 242 mg/dL — ABNORMAL HIGH (ref 0–200)
HDL: 42.2 mg/dL (ref >39.00)
LDL Cholesterol: 168 mg/dL — ABNORMAL HIGH (ref 0–99)
NonHDL: 199.34
Total CHOL/HDL Ratio: 6
Triglycerides: 159 mg/dL — ABNORMAL HIGH (ref 0.0–149.0)
VLDL: 31.8 mg/dL (ref 0.0–40.0)

## 2019-08-12 LAB — VITAMIN D 25 HYDROXY (VIT D DEFICIENCY, FRACTURES): VITD: 47.96 ng/mL (ref 30.00–100.00)

## 2019-08-12 LAB — TSH: TSH: 2.18 u[IU]/mL (ref 0.35–4.50)

## 2019-08-16 ENCOUNTER — Encounter: Payer: Self-pay | Admitting: Family Medicine

## 2019-08-16 NOTE — Progress Notes (Signed)
[facility] [place]/thx dmf

## 2019-08-19 ENCOUNTER — Telehealth: Payer: Self-pay

## 2019-08-19 NOTE — Telephone Encounter (Signed)
PEC-LMOVM for pt to call and schedule a virtual visit to discuss her Bone Density scan and possible treatments/plz schedule appointment with Dr. Arie Sabina dmf

## 2019-09-01 NOTE — Progress Notes (Signed)
.    Virtual Visit via Video   Due to the COVID-19 pandemic, this visit was completed with telemedicine (audio/video) technology to reduce patient and provider exposure as well as to preserve personal protective equipment.   I connected with [name] [name] by a video enabled telemedicine application and verified that I am speaking with the correct person using two identifiers. Location patient: Home Location provider: [facility], Office Persons participating in the virtual visit: Peter Minium, MD   I discussed the limitations of evaluation and management by telemedicine and the availability of in person appointments. The patient expressed understanding and agreed to proceed.  Care Team   Patient Care Team: Lucille Passy, MD as PCP - General (Family Medicine)  Subjective:   HPI:  Discuss labs-  HLD- she stopped taking statin.  She is afraid of them.  Unfortunately she is not willing to try them again but she has already changed her diet.    Unfortunately she is smoking again.  Back to 1/2 pack per day.  Quit cold [place] when she was pregnant.  Osteoporosis- unfortunately- T score -2.9. (spine)- awaaiting results from Dr. Matthew Saras. Lab Results  Component Value Date   CHOL 242 (H) 08/12/2019   HDL 42.20 08/12/2019   LDLCALC 168 (H) 08/12/2019   TRIG 159.0 (H) 08/12/2019   CHOLHDL 6 08/12/2019   The 10-year ASCVD risk score Mikey Bussing DC Jr., et al., 2013) is: 15%   Values used to calculate the score:     Age: 63 years     Sex: Female     Is Non-Hispanic African American: No     Diabetic: No     Tobacco smoker: Yes     Systolic Blood Pressure: 0000000 mmHg     Is BP treated: No     HDL Cholesterol: 42.2 mg/dL     Total Cholesterol: 242 mg/dL  Lab Results  Component Value Date   ALT 19 08/12/2019   AST 16 08/12/2019   ALKPHOS 84 08/12/2019   BILITOT 0.5 08/12/2019   Lab Results  Component Value Date   NA 141 08/12/2019   K 4.0 08/12/2019   CL 107 08/12/2019    CO2 27 08/12/2019   Lab Results  Component Value Date   CREATININE 0.73 08/12/2019   Lab Results  Component Value Date   TSH 2.18 08/12/2019    Review of Systems  Constitutional: Negative.   HENT: Negative.   Eyes: Negative.   Respiratory: Negative.   Cardiovascular: Negative.   Gastrointestinal: Negative.   Genitourinary: Negative.   Musculoskeletal: Negative.   Skin: Negative.   Neurological: Negative.   Endo/Heme/Allergies: Negative.   Psychiatric/Behavioral: Negative.   All other systems reviewed and are negative.    Patient Active Problem List   Diagnosis Date Noted  . Osteoporosis 09/02/2019  . Tenderness of right calf 05/31/2019  . Calf swelling 05/31/2019  . Elevated BP without diagnosis of hypertension 01/19/2018  . HLD (hyperlipidemia) 02/22/2016  . Adjustment disorder with mixed anxiety and depressed mood 07/04/2015  . Allergic rhinitis 07/04/2015  . HTN (hypertension) 03/24/2015  . Tobacco abuse 09/01/2012  . Fibromyalgia   . 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 Smoking status: Current Every Day Smoker    Packs/day: 0.50    Years: 30.00    Pack years: 15.00    Types: Cigarettes  . Smokeless tobacco: Never Used  Substance Use Topics  . Alcohol  use: Yes    Comment: rare    Current Outpatient Medications:  .  ALPRAZolam (XANAX) 0.25 MG tablet, TAKE 1 TABLET BY MOUTH DAILY AS NEEDED, Disp: 30 tablet, Rfl: 5 .  aspirin EC 81 MG tablet, Take 1 tablet (81 mg total) by mouth daily., Disp:  , Rfl:  .  Chlorphen-PE-Acetaminophen (NOREL AD) 4-10-325 MG TABS, Take 1-2 tablets every 8 hours as needed., Disp: 84 tablet, Rfl: 0 .  Cholecalciferol (VITAMIN D3) 2000 UNITS TABS, Take 1 capsule by mouth daily., Disp: , Rfl:  .  fluticasone (FLONASE) 50 MCG/ACT nasal spray, Place 2 sprays into both nostrils daily., Disp: 16 g, Rfl: 6 .  Magnesium 250 MG TABS, Take 1 tablet by mouth daily., Disp: , Rfl:  .  Omega-3 Fatty  Acids (OMEGA 3 PO), Take by mouth., Disp: , Rfl:  .  vitamin E 400 UNIT capsule, Take 400 Units by mouth daily., Disp: , Rfl:  .  calcium carbonate (OS-CAL) 600 MG TABS, Take 1 tablet by mouth 2 (two) times daily. Bone-Up Supp., Disp: , Rfl:  .  nicotine (NICODERM CQ) 7 mg/24hr patch, Place 1 patch (7 mg total) onto the skin daily., Disp: 28 patch, Rfl: 0  Allergies  Allergen Reactions  . Hydrocodone Rash  . Demerol [Meperidine] Nausea And Vomiting    severe  . Sulfa Antibiotics Swelling and Rash    Objective:  Ht 5\' 8"  (1.727 m)   BMI 25.18 kg/m  BP Readings from Last 3 Encounters:  05/31/19 112/76  01/18/19 126/82  10/01/18 124/80    VITALS: Per patient if applicable, see vitals. GENERAL: Alert, appears well and in no acute distress. HEENT: Atraumatic, conjunctiva clear, no obvious abnormalities on inspection of external nose and ears. NECK: Normal movements of the head and neck. CARDIOPULMONARY: No increased WOB. Speaking in clear sentences. I:E ratio WNL.  MS: Moves all visible extremities without noticeable abnormality. PSYCH: Pleasant and cooperative, well-groomed. Speech normal rate and rhythm. Affect is appropriate. Insight and judgement are appropriate. Attention is focused, linear, and appropriate.  NEURO: CN grossly intact. Oriented as arrived to appointment on time with no prompting. Moves both UE equally.  SKIN: No obvious lesions, wounds, erythema, or cyanosis noted on face or hands.  Depression screen [facility] 2/9 09/02/2019 11/19/2017 03/06/2016  Decreased Interest 0 0 0  Down, Depressed, Hopeless 0 0 0  PHQ - 2 Score 0 0 0    Assessment and Plan:   [name] [name] was seen today for follow-up.  Diagnoses and all orders for this visit:  Hyperlipidemia, unspecified hyperlipidemia type  Tobacco abuse  Age-related osteoporosis without current pathological fracture  Tobacco use disorder, continuous -     Ambulatory referral to Smoking Cessation Program -     nicotine  (NICODERM CQ) 7 mg/24hr patch; Place 1 patch (7 mg total) onto the skin daily.    Marland Kitchen COVID-19 Education: The signs and symptoms of COVID-19 were discussed with the patient and how to seek care for testing if needed. The importance of social distancing was discussed today. . Reviewed expectations re: course of current medical issues. . Discussed self-management of symptoms. . Outlined signs and symptoms indicating need for more acute intervention. . Patient verbalized understanding and all questions were answered. Marland Kitchen Health Maintenance issues including appropriate healthy diet, exercise, and smoking avoidance were discussed with patient. . See orders for this visit as documented in the electronic medical record.  Arnette Norris, MD  Records requested if needed. Time spent: 81minutes, of which >  50% was spent in obtaining information about her symptoms, reviewing her previous labs, evaluations, and treatments, counseling her about her condition (please see the discussed topics above), and developing a plan to further investigate it; she had a number of questions which I addressed.

## 2019-09-02 ENCOUNTER — Other Ambulatory Visit: Payer: Self-pay

## 2019-09-02 ENCOUNTER — Encounter: Payer: Self-pay | Admitting: Family Medicine

## 2019-09-02 ENCOUNTER — Ambulatory Visit (INDEPENDENT_AMBULATORY_CARE_PROVIDER_SITE_OTHER): Payer: BC Managed Care – PPO | Admitting: Family Medicine

## 2019-09-02 VITALS — Ht 68.0 in

## 2019-09-02 DIAGNOSIS — E785 Hyperlipidemia, unspecified: Secondary | ICD-10-CM | POA: Diagnosis not present

## 2019-09-02 DIAGNOSIS — Z720 Tobacco use: Secondary | ICD-10-CM

## 2019-09-02 DIAGNOSIS — F17209 Nicotine dependence, unspecified, with unspecified nicotine-induced disorders: Secondary | ICD-10-CM

## 2019-09-02 DIAGNOSIS — M810 Age-related osteoporosis without current pathological fracture: Secondary | ICD-10-CM

## 2019-09-02 HISTORY — DX: Age-related osteoporosis without current pathological fracture: M81.0

## 2019-09-02 MED ORDER — NICOTINE 7 MG/24HR TD PT24: 7.0000 mg | MEDICATED_PATCH | TRANSDERMAL | 0 refills | Status: DC

## 2019-09-02 NOTE — Assessment & Plan Note (Addendum)
Smoking cessation instruction/counseling given:  counseled patient on the dangers of tobacco use, advised patient to stop smoking, and reviewed strategies to maximize success.  She is picking a quit date and then will start patches.  Also referred her to smoking cessation program.  She will follow up with me in 2 months.  >40 minutes spent in face to face time with patient, >50% spent in counselling or coordination of care- explaining her tobacco abuse, HLD, osteoporosis and how if we can get her to quit smoking, her other medical issues would improve.  The patient indicates understanding of these issues and agrees with the plan.

## 2019-09-02 NOTE — Assessment & Plan Note (Signed)
Elevated with high ascvd score. The 10-year ASCVD risk score [name] [name] DC [name] [name]., et al., 2013) is: 15%   Values used to calculate the score:     Age: 63 years     Sex: Female     Is Non-Hispanic African American: No     Diabetic: No     Tobacco smoker: Yes     Systolic Blood Pressure: 0000000 mmHg     Is BP treated: No     HDL Cholesterol: 42.2 mg/dL     Total Cholesterol: 242 mg/dL  She is refusing statin but willing to change her diet (already has) and quit smoking which should help tremendously.  Follow up with me in 2 months for labs as well. The patient indicates understanding of these issues and agrees with the plan.

## 2019-09-02 NOTE — Assessment & Plan Note (Signed)
She wants to hold off on fosamax. Vit D and Calcium recently checked were good,  continue good calcium and vitamin d intake in diet and weight bearing exercise.  Follow up bone density in 2 years. Quitting smoking will help as well. The patient indicates understanding of these issues and agrees with the plan.

## 2019-09-15 ENCOUNTER — Ambulatory Visit (INDEPENDENT_AMBULATORY_CARE_PROVIDER_SITE_OTHER): Payer: BC Managed Care – PPO | Admitting: Family Medicine

## 2019-09-15 ENCOUNTER — Other Ambulatory Visit: Payer: Self-pay

## 2019-09-15 ENCOUNTER — Ambulatory Visit: Payer: Self-pay | Admitting: *Deleted

## 2019-09-15 ENCOUNTER — Encounter: Payer: Self-pay | Admitting: Family Medicine

## 2019-09-15 DIAGNOSIS — B9689 Other specified bacterial agents as the cause of diseases classified elsewhere: Secondary | ICD-10-CM | POA: Insufficient documentation

## 2019-09-15 DIAGNOSIS — J019 Acute sinusitis, unspecified: Secondary | ICD-10-CM

## 2019-09-15 MED ORDER — AMOXICILLIN-POT CLAVULANATE 875-125 MG PO TABS: 1.0000 | ORAL_TABLET | Freq: Two times a day (BID) | ORAL | 0 refills | Status: DC

## 2019-09-15 NOTE — Assessment & Plan Note (Signed)
-  Continue to push fluids, rest and supportive care.  -Start augmentin bid -Discussed that she should let me know if not improving over the next 48-72 hours.

## 2019-09-15 NOTE — Telephone Encounter (Signed)
   Reason for Disposition . Earache  Answer Assessment - Initial Assessment Questions 1. LOCATION: "Where does it hurt?"      Between eyes, soreness under eyes  2. ONSET: "When did the sinus pain start?"  (e.g., hours, days)      9 days  3. SEVERITY: "How bad is the pain?"   (Scale 1-10; mild, moderate or severe)   - MILD (1-3): doesn't interfere with normal activities    - MODERATE (4-7): interferes with normal activities (e.g., work or school) or awakens from sleep   - SEVERE (8-10): excruciating pain and patient unable to do any normal activities        Moderate  4. RECURRENT SYMPTOM: "Have you ever had sinus problems before?" If so, ask: "When was the last time?" and "What happened that time?"      Yes, several times per year 5. NASAL CONGESTION: "Is the nose blocked?" If so, ask, "Can you open it or must you breathe through the mouth?"     Nose not completely blocked, using flonase 6. NASAL DISCHARGE: "Do you have discharge from your nose?" If so ask, "What color?"     Yellow  7. FEVER: "Do you have a fever?" If so, ask: "What is it, how was it measured, and when did it start?"       No fever  8. OTHER SYMPTOMS: "Do you have any other symptoms?" (e.g., sore throat, cough, earache, difficulty breathing)     Cough, left earache  9. PREGNANCY: "Is there any chance you are pregnant?" "When was your last menstrual period?"     N/A  Protocols used: SINUS PAIN OR CONGESTION-A-AH  Patient states she has had runny nose and sinus pressure headache for 9 days.  States symptoms have worsened in the past 3 days- now having productive cough with yellow phlegm, left earache, continued sinus pressure between and under eyes.  Patient is using flonase and mucinex DM.  She denies difficulty breathing and fever.  Advised patient that I recommend a visit today due to worsening symptoms and ear pain.  Transferred call to PCP office, patient scheduled for today at 1pm.

## 2019-09-15 NOTE — Progress Notes (Signed)
[name] [name] - 63 y.o. female MRN [id number]  Date of birth: 07-04-1956   This visit type was conducted due to national recommendations for restrictions regarding the COVID-19 Pandemic (e.g. social distancing).  This format is felt to be most appropriate for this patient at this time.  All issues noted in this document were discussed and addressed.  No physical exam was performed (except for noted visual exam findings with Video Visits).  I discussed the limitations of evaluation and management by telemedicine and the availability of in person appointments. The patient expressed understanding and agreed to proceed.  I connected with@ on 09/15/19 at  1:00 PM EDT by a video enabled telemedicine application and verified that I am speaking with the correct person using two identifiers.  Interactive audio and video telecommunications were attempted between this provider and patient, however failed, due to patient having technical difficulties OR patient did not have access to video capability.  We continued and completed visit with audio only.     Patient Location: Home [address] [place] [address]   Provider location:   [facility]  Chief Complaint  Patient presents with  . Cough    yellow mucus Tried otc mucinex dm  Denies fever,sob  . Nasal Congestion    tried flonase  . Headache  . sinus pressure  . Ear Pain    left     HPI  [name] [name] is a 63 y.o. female who presents via audio/video conferencing for a telehealth visit today.  She reports 3-4 day history of sinus congestion with pain, L ear pain, upper tooth and gum pain and thick yellow mucus.    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   She has been using flonase and mucinex dm without much relief.  She does have cough.  She denies fever, chills, shortness of breath,  wheezing, nausea, vomiting, diarrhea.  She is a current smoker.    ROS:  A comprehensive ROS was completed and negative except as noted per HPI  Past Medical History:  Diagnosis Date  . Anxiety   . Arthritis    HANDS,  KNEES  . Bone tumor (benign)    RIGHT FEMUR  . Borderline hypertension   . Female pelvic pain   . Fibromyalgia   . Left knee injury    TENDONDINITIS/  CHONDRAMALIA  . PONV (postoperative nausea and vomiting)     Past Surgical History:  Procedure Laterality Date  . DILATION AND CURETTAGE OF UTERUS  1986  &  1988   RETAINED PLACENTA  . LAPAROSCOPIC ASSISTED VAGINAL HYSTERECTOMY  01-25-2003  . LAPAROSCOPY N/A 04/18/2014   Procedure: LAPAROSCOPY DIAGNOSTIC;  Surgeon: Margarette Asal, MD;  Location: [facility];  Service: Gynecology;  Laterality: N/A;  . NASAL SEPTUM SURGERY  AGE 29  . SALPINGOOPHORECTOMY Bilateral 04/18/2014   Procedure: SALPINGO OOPHORECTOMY;  Surgeon: Margarette Asal, MD;  Location: [facility];  Service: Gynecology;  Laterality: Bilateral;  . TONSILLECTOMY  AGE 87  . TUBAL LIGATION      Family History  Problem Relation Age of Onset  . Colon cancer Neg Hx   . Stomach cancer Neg Hx     Social History   Socioeconomic History  . Marital status: Married    Spouse name: Not on file  .  Number of children: Not on file  . Years of education: Not on file  . Highest education level: Not on file  Occupational History  . Not on file  Social Needs  . Financial resource strain: Not on file  . Food insecurity    Worry: Not on file    Inability: Not on file  . Transportation needs    Medical: Not on file    Non-medical: Not on file  Tobacco Use  . Smoking status: Current Every Day Smoker    Packs/day: 0.50    Years: 30.00    Pack years: 15.00    Types: Cigarettes  . Smokeless tobacco: Never Used  Substance and Sexual Activity  . Alcohol use: Yes    Comment: rare  . Drug use: No  . Sexual activity: Not on  file  Lifestyle  . Physical activity    Days per week: Not on file    Minutes per session: Not on file  . Stress: Not on file  Relationships  . Social Herbalist on phone: Not on file    Gets together: Not on file    Attends religious service: Not on file    Active member of club or organization: Not on file    Attends meetings of clubs or organizations: Not on file    Relationship status: Not on file  . Intimate partner violence    Fear of current or ex partner: Not on file    Emotionally abused: Not on file    Physically abused: Not on file    Forced sexual activity: Not on file  Other Topics Concern  . Not on file  Social History Narrative  . Not on file     Current Outpatient Medications:  .  aspirin EC 81 MG tablet, Take 1 tablet (81 mg total) by mouth daily., Disp:  , Rfl:  .  calcium carbonate (OS-CAL) 600 MG TABS, Take 1 tablet by mouth 2 (two) times daily. Bone-Up Supp., Disp: , Rfl:  .  Cholecalciferol (VITAMIN D3) 2000 UNITS TABS, Take 1 capsule by mouth daily., Disp: , Rfl:  .  fluticasone (FLONASE) 50 MCG/ACT nasal spray, Place 2 sprays into both nostrils daily., Disp: 16 g, Rfl: 6 .  Magnesium 250 MG TABS, Take 1 tablet by mouth daily., Disp: , Rfl:  .  Omega-3 Fatty Acids (OMEGA 3 PO), Take by mouth., Disp: , Rfl:  .  vitamin E 400 UNIT capsule, Take 400 Units by mouth daily., Disp: , Rfl:  .  ALPRAZolam (XANAX) 0.25 MG tablet, TAKE 1 TABLET BY MOUTH DAILY AS NEEDED (Patient not taking: Reported on 09/15/2019), Disp: 30 tablet, Rfl: 5 .  amoxicillin-clavulanate (AUGMENTIN) 875-125 MG tablet, Take 1 tablet by mouth 2 (two) times daily., Disp: 20 tablet, Rfl: 0 .  Chlorphen-PE-Acetaminophen (NOREL AD) 4-10-325 MG TABS, Take 1-2 tablets every 8 hours as needed. (Patient not taking: Reported on 09/15/2019), Disp: 84 tablet, Rfl: 0 .  nicotine (NICODERM CQ) 7 mg/24hr patch, Place 1 patch (7 mg total) onto the skin daily. (Patient not taking: Reported on  09/15/2019), Disp: 28 patch, Rfl: 0  EXAM:  VITALS per patient if applicable: Temp 0000000 F (36.5 C)   GENERAL: alert, oriented, she sounds very congested but in no acute distress  PSYCH/NEURO: pleasant and cooperative, no obvious depression or anxiety, speech and thought processing grossly intact  ASSESSMENT AND PLAN:  Discussed the following assessment and plan:  Acute bacterial rhinosinusitis -Continue to push  fluids, rest and supportive care.  -Start augmentin bid -Discussed that she should let me know if not improving over the next 48-72 hours.         I discussed the assessment and treatment plan with the patient. The patient was provided an opportunity to ask questions and all were answered. The patient agreed with the plan and demonstrated an understanding of the instructions.   The patient was advised to call back or seek an in-person evaluation if the symptoms worsen or if the condition fails to improve as anticipated.   Luetta Nutting, DO

## 2019-11-09 ENCOUNTER — Telehealth: Payer: Self-pay

## 2019-11-09 NOTE — Telephone Encounter (Signed)
Copied from [place] [id number]. Topic: General - Other >> Nov 09, 2019  2:14 PM Leward Quan A wrote: Reason for CRM: Patient called to inform Dr Deborra Medina that she stop smoking two weeks ago. Say that she brought some Nicotine patch that were 21 MG and want to know should she purchase the same strength or get something of a lower dose. Also want to know about the blood work that she was supposed to repeat for her cholesterol. Patient have some other concerns that she would like addressed one she think may be a neck strain. States that if Dr Deborra Medina think she should be seen then Please call patient at Ph#  [phone] or Ph# [phone]

## 2019-11-09 NOTE — Telephone Encounter (Signed)
I spoke with pt and she informed me that she wanted Dr. Deborra Medina to know that she canceled the medication prescribed for the patch.  Pt explains that she has been using an OTC patch at 21mg .  Pt asked if she could come into the office in regards to neck pain.  Pt said that the pain has been there for x79months, and now she can feel a knot on her rt side of neck x2-66months.  Pt would like a in office appointment. I did explain that we were trying to keep pt's out of office due increased Covid numbers.  Please advise

## 2019-11-10 ENCOUNTER — Encounter: Payer: Self-pay | Admitting: Family Medicine

## 2019-11-10 ENCOUNTER — Other Ambulatory Visit: Payer: Self-pay

## 2019-11-10 ENCOUNTER — Ambulatory Visit (INDEPENDENT_AMBULATORY_CARE_PROVIDER_SITE_OTHER): Payer: BC Managed Care – PPO | Admitting: Family Medicine

## 2019-11-10 VITALS — BP 122/70 | HR 64 | Ht 68.0 in | Wt 163.0 lb

## 2019-11-10 DIAGNOSIS — M542 Cervicalgia: Secondary | ICD-10-CM

## 2019-11-10 DIAGNOSIS — M7912 Myalgia of auxiliary muscles, head and neck: Secondary | ICD-10-CM

## 2019-11-10 NOTE — Patient Instructions (Addendum)
Heating pad 2x day for 15-20 min on then off Stretching/ROM exercises after heating pad Take ibuprofen 600mg  twice per day with food x 7-10 days See chiropractor and/or massage therapist

## 2019-11-10 NOTE — Telephone Encounter (Signed)
Pt agreed to schedule an appointment today.

## 2019-11-10 NOTE — Progress Notes (Addendum)
[name] [name] is a 63 y.o. female  Chief Complaint  Patient presents with  . Neck Pain    knot on neck    HPI: [name] [name] is a 63 y.o. female patient of Dr. Deborra Medina who complains of pain and tightness on her neck on the Rt side. Not painful at rest. She states she is aware of it when she bends her chin down toward her chest or moves her head a certain way.  She had a root canal x 2 on Rt side and then a 1cm cyst in her jaw. She had surgery in 06/2019 (Dr. Loyal Gambler).  She sees a Restaurant manager, fast food for DDD in her cervical spine but has not been seen in 4+mo due to [place].   She has not smoked in 15 days!  Past Medical History:  Diagnosis Date  . Anxiety   . Arthritis    HANDS,  KNEES  . Bone tumor (benign)    RIGHT FEMUR  . Borderline hypertension   . Female pelvic pain   . Fibromyalgia   . Left knee injury    TENDONDINITIS/  CHONDRAMALIA  . PONV (postoperative nausea and vomiting)     Past Surgical History:  Procedure Laterality Date  . DILATION AND CURETTAGE OF UTERUS  1986  &  1988   RETAINED PLACENTA  . LAPAROSCOPIC ASSISTED VAGINAL HYSTERECTOMY  01-25-2003  . LAPAROSCOPY N/A 04/18/2014   Procedure: LAPAROSCOPY DIAGNOSTIC;  Surgeon: Margarette Asal, MD;  [facility];  Service: Gynecology;  Laterality: N/A;  . NASAL SEPTUM SURGERY  AGE 8  . SALPINGOOPHORECTOMY Bilateral 04/18/2014   Procedure: SALPINGO OOPHORECTOMY;  Surgeon: Margarette Asal, MD;  Location: [facility];  Service: Gynecology;  Laterality: Bilateral;  . TONSILLECTOMY  AGE 55  . TUBAL LIGATION      Social History   Socioeconomic History  . Marital status: Married    Spouse name: Not on file  . Number of children: Not on file  . Years of education: Not on file  . Highest education level: Not on file  Occupational History  . Not on file  Social Needs  . Financial resource strain: Not on file  . Food insecurity    Worry: Not on file    Inability: Not on file   . Transportation needs    Medical: Not on file    Non-medical: Not on file  Tobacco Use  . Smoking status: Current Every Day Smoker    Packs/day: 0.50    Years: 30.00    Pack years: 15.00    Types: Cigarettes  . Smokeless tobacco: Never Used  Substance and Sexual Activity  . Alcohol use: Yes    Comment: rare  . Drug use: No  . Sexual activity: Not on file  Lifestyle  . Physical activity    Days per week: Not on file    Minutes per session: Not on file  . Stress: Not on file  Relationships  . Social Herbalist on phone: Not on file    Gets together: Not on file    Attends religious service: Not on file    Active member of club or organization: Not on file    Attends meetings of clubs or organizations: Not on file    Relationship status: Not on file  . Intimate partner violence    Fear of current or ex partner: Not on file    Emotionally abused: Not on file  Physically abused: Not on file    Forced sexual activity: Not on file  Other Topics Concern  . Not on file  Social History Narrative  . Not on file    Family History  Problem Relation Age of Onset  . Colon cancer Neg Hx   . Stomach cancer Neg Hx      Immunization History  Administered Date(s) Administered  . Influenza,inj,Quad PF,6+ Mos 11/19/2017, 10/01/2018  . Tdap 11/19/2017    Outpatient Encounter Medications as of 11/10/2019  Medication Sig Note  . ALPRAZolam (XANAX) 0.25 MG tablet TAKE 1 TABLET BY MOUTH DAILY AS NEEDED (Patient not taking: Reported on 09/15/2019)   . amoxicillin-clavulanate (AUGMENTIN) 875-125 MG tablet Take 1 tablet by mouth 2 (two) times daily.   Marland Kitchen aspirin EC 81 MG tablet Take 1 tablet (81 mg total) by mouth daily.   . calcium carbonate (OS-CAL) 600 MG TABS Take 1 tablet by mouth 2 (two) times daily. Bone-Up Supp.   . Chlorphen-PE-Acetaminophen (NOREL AD) 4-10-325 MG TABS Take 1-2 tablets every 8 hours as needed. (Patient not taking: Reported on 09/15/2019) 09/02/2019: For  allergy PRN  . Cholecalciferol (VITAMIN D3) 2000 UNITS TABS Take 1 capsule by mouth daily.   . fluticasone (FLONASE) 50 MCG/ACT nasal spray Place 2 sprays into both nostrils daily.   . Magnesium 250 MG TABS Take 1 tablet by mouth daily.   . nicotine (NICODERM CQ) 7 mg/24hr patch Place 1 patch (7 mg total) onto the skin daily. (Patient not taking: Reported on 09/15/2019)   . Omega-3 Fatty Acids (OMEGA 3 PO) Take by mouth.   . vitamin E 400 UNIT capsule Take 400 Units by mouth daily.    No facility-administered encounter medications on file as of 11/10/2019.      ROS: Pertinent positives and negatives noted in HPI. Remainder of ROS non-contributory   Allergies  Allergen Reactions  . Hydrocodone Rash  . Demerol [Meperidine] Nausea And Vomiting    severe  . Molds & Smuts   . Sulfa Antibiotics Swelling and Rash    BP 122/70   Pulse 64   Ht 5\' 8"  (1.727 m)   Wt 163 lb (73.9 kg)   SpO2 97%   BMI 24.78 kg/m  Wt Readings from Last 3 Encounters:  11/10/19 163 lb (73.9 kg)  05/31/19 165 lb 9.6 oz (75.1 kg)  01/18/19 164 lb (74.4 kg)   BP Readings from Last 3 Encounters:  11/10/19 122/70  05/31/19 112/76  01/18/19 126/82    Physical Exam  Constitutional: She is oriented to person, place, and time. She appears well-developed and well-nourished. No distress.  Neck: Normal range of motion. Neck supple. Muscular tenderness present. No neck rigidity. No edema present. No thyromegaly present.  No palpable mass or lymphadenopathy; + TTP and  over Rt SCM muscle, FROM but pt reports "tightness" with rotation and side-bending  Lymphadenopathy:    She has no cervical adenopathy.  Neurological: She is alert and oriented to person, place, and time.  Psychiatric: She has a normal mood and affect. Her behavior is normal.     A/P:  1. Sternocleidomastoid muscle tenderness 2. Musculoskeletal neck pain - heating pad 2-3x/day - stretching/ROM exercises daily after heating pad - ibuprofen  600mg  BID with food x 7-10 days - pt feels massage would help but is concerned about doing this in light of COVID pandemic, which I understand - f/u PRN if symptoms worsen or do not improve in 2-3 wks Discussed plan and reviewed medications  with patient, including risks, benefits, and potential side effects. Pt expressed understand. All questions answered.   This visit occurred during the SARS-CoV-2 public health emergency.  Safety protocols were in place, including screening questions prior to the visit, additional usage of staff PPE, and extensive cleaning of exam room while observing appropriate contact time as indicated for disinfecting solutions.

## 2019-11-10 NOTE — Telephone Encounter (Signed)
Yes my schedule is full today but another provider could see her for this if she wanted to be seen today.

## 2020-01-18 DIAGNOSIS — M1811 Unilateral primary osteoarthritis of first carpometacarpal joint, right hand: Secondary | ICD-10-CM | POA: Diagnosis not present

## 2020-01-18 DIAGNOSIS — M1812 Unilateral primary osteoarthritis of first carpometacarpal joint, left hand: Secondary | ICD-10-CM | POA: Diagnosis not present

## 2020-01-18 DIAGNOSIS — M65311 Trigger thumb, right thumb: Secondary | ICD-10-CM | POA: Diagnosis not present

## 2020-02-01 ENCOUNTER — Telehealth: Payer: Self-pay | Admitting: General Practice

## 2020-02-01 NOTE — Telephone Encounter (Signed)
Needs to schedule OV with new pcp for additional refills

## 2020-02-01 NOTE — Telephone Encounter (Signed)
Patient is calling and requesting a refill for xanax sent to [facility] in [place]. CB is [phone]

## 2020-02-15 DIAGNOSIS — M1812 Unilateral primary osteoarthritis of first carpometacarpal joint, left hand: Secondary | ICD-10-CM | POA: Diagnosis not present

## 2020-02-15 DIAGNOSIS — M65311 Trigger thumb, right thumb: Secondary | ICD-10-CM | POA: Diagnosis not present

## 2020-02-15 DIAGNOSIS — M1811 Unilateral primary osteoarthritis of first carpometacarpal joint, right hand: Secondary | ICD-10-CM | POA: Diagnosis not present

## 2020-03-03 NOTE — Telephone Encounter (Signed)
I talked to pt and she said she has a lot going on right now but will look into who she wants to see and call us back, she said she doesn't take that medication that often and that she was just wanting to have some on hand just in case

## 2020-05-17 ENCOUNTER — Other Ambulatory Visit: Payer: Self-pay

## 2020-05-18 ENCOUNTER — Encounter: Payer: Self-pay | Admitting: Family Medicine

## 2020-05-18 ENCOUNTER — Ambulatory Visit (INDEPENDENT_AMBULATORY_CARE_PROVIDER_SITE_OTHER): Payer: BC Managed Care – PPO | Admitting: Family Medicine

## 2020-05-18 VITALS — BP 160/86 | HR 78 | Temp 98.7°F | Ht 68.0 in | Wt 175.4 lb

## 2020-05-18 DIAGNOSIS — E785 Hyperlipidemia, unspecified: Secondary | ICD-10-CM | POA: Diagnosis not present

## 2020-05-18 DIAGNOSIS — M797 Fibromyalgia: Secondary | ICD-10-CM | POA: Diagnosis not present

## 2020-05-18 DIAGNOSIS — M51369 Other intervertebral disc degeneration, lumbar region without mention of lumbar back pain or lower extremity pain: Secondary | ICD-10-CM

## 2020-05-18 DIAGNOSIS — M5136 Other intervertebral disc degeneration, lumbar region: Secondary | ICD-10-CM

## 2020-05-18 DIAGNOSIS — R7301 Impaired fasting glucose: Secondary | ICD-10-CM | POA: Diagnosis not present

## 2020-05-18 DIAGNOSIS — M5030 Other cervical disc degeneration, unspecified cervical region: Secondary | ICD-10-CM

## 2020-05-18 LAB — LIPID PANEL
Cholesterol: 223 mg/dL — ABNORMAL HIGH (ref 0–200)
HDL: 42.8 mg/dL (ref >39.00)
LDL Cholesterol: 144 mg/dL — ABNORMAL HIGH (ref 0–99)
NonHDL: 180.69
Total CHOL/HDL Ratio: 5
Triglycerides: 182 mg/dL — ABNORMAL HIGH (ref 0.0–149.0)
VLDL: 36.4 mg/dL (ref 0.0–40.0)

## 2020-05-18 LAB — HEMOGLOBIN A1C: Hgb A1c MFr Bld: 6 % (ref 4.6–6.5)

## 2020-05-18 NOTE — Patient Instructions (Signed)
  Referral to physical therapy   Consider referral to Physical Medicine & Rehabilitation  [email or web address]

## 2020-05-18 NOTE — Progress Notes (Signed)
[name] [name] is a 64 y.o. female  Chief Complaint  Patient presents with  . Establish Care    Pt here for TOC and asking for lab work ansd she is fasting and c/o bilatieral leg pain and lower back pain.    HPI: [name] [name] is a 64 y.o. female  1. Requests repeat labs, specifically for BS and lipids. She is fasting 2. Pt with chronic low back and neck pain. H/o DDD (she brings MRI reports from 2017 (lumbar) and 2009 (cervical) which will be scanned into chart). She has tried multiple meds but states she is not able to tolerate them. She was offered epidural injection but she is reluctant to do this. She has not done PT for her back.  3. She notes a h/o arthritis in knees, hands - chronic, no change from baseline.   Past Medical History:  Diagnosis Date  . Anxiety   . Arthritis    HANDS,  KNEES  . Bone tumor (benign)    RIGHT FEMUR  . Borderline hypertension   . Female pelvic pain   . Fibromyalgia   . Left knee injury    TENDONDINITIS/  CHONDRAMALIA  . PONV (postoperative nausea and vomiting)     Past Surgical History:  Procedure Laterality Date  . DILATION AND CURETTAGE OF UTERUS  1986  &  1988   RETAINED PLACENTA  . LAPAROSCOPIC ASSISTED VAGINAL HYSTERECTOMY  01-25-2003  . LAPAROSCOPY N/A 04/18/2014   Procedure: LAPAROSCOPY DIAGNOSTIC;  Surgeon: Margarette Asal, MD;  Location: [facility];  Service: Gynecology;  Laterality: N/A;  . NASAL SEPTUM SURGERY  AGE 48  . SALPINGOOPHORECTOMY Bilateral 04/18/2014   Procedure: SALPINGO OOPHORECTOMY;  Surgeon: Margarette Asal, MD;  Location: [facility];  Service: Gynecology;  Laterality: Bilateral;  . TONSILLECTOMY  AGE 33  . TUBAL LIGATION      Social History   Socioeconomic History  . Marital status: Married    Spouse name: Not on file  . Number of children: Not on file  . Years of education: Not on file  . Highest education level: Not on file  Occupational History  . Not on file    Tobacco Use  . Smoking status: Current Every Day Smoker    Packs/day: 0.50    Years: 30.00    Pack years: 15.00    Types: Cigarettes  . Smokeless tobacco: Never Used  Substance and Sexual Activity  . Alcohol use: Yes    Comment: rare  . Drug use: No  . Sexual activity: Not on file  Other Topics Concern  . Not on file  Social History Narrative  . Not on file   Social Determinants of Health   Financial Resource Strain:   . Difficulty of Paying Living Expenses:   Food Insecurity:   . Worried About Charity fundraiser in the Last Year:   . Arboriculturist in the Last Year:   Transportation Needs:   . Film/video editor (Medical):   Marland Kitchen Lack of Transportation (Non-Medical):   Physical Activity:   . Days of Exercise per Week:   . Minutes of Exercise per Session:   Stress:   . Feeling of Stress :   Social Connections:   . Frequency of Communication with Friends and Family:   . Frequency of Social Gatherings with Friends and Family:   . Attends Religious Services:   . Active Member of Clubs or Organizations:   .  Attends Archivist Meetings:   Marland Kitchen Marital Status:   Intimate Partner Violence:   . Fear of Current or Ex-Partner:   . Emotionally Abused:   Marland Kitchen Physically Abused:   . Sexually Abused:     Family History  Problem Relation Age of Onset  . Colon cancer Neg Hx   . Stomach cancer Neg Hx      Immunization History  Administered Date(s) Administered  . Influenza,inj,Quad PF,6+ Mos 11/19/2017, 10/01/2018  . Tdap 11/19/2017    Outpatient Encounter Medications as of 05/18/2020  Medication Sig Note  . ALPRAZolam (XANAX) 0.25 MG tablet TAKE 1 TABLET BY MOUTH DAILY AS NEEDED   . calcium carbonate (OS-CAL) 600 MG TABS Take 1 tablet by mouth 2 (two) times daily. Bone-Up Supp.   . Cholecalciferol (VITAMIN D3) 2000 UNITS TABS Take 1 capsule by mouth daily.   . fluticasone (FLONASE) 50 MCG/ACT nasal spray Place 2 sprays into both nostrils daily.   . Magnesium 250  MG TABS Take 1 tablet by mouth daily.   . magnesium gluconate (MAGONATE) 500 MG tablet Take by mouth.   . Omega-3 Fatty Acids (OMEGA 3 PO) Take by mouth.   . vitamin E 400 UNIT capsule Take 400 Units by mouth daily.   Marland Kitchen aspirin EC 81 MG tablet Take 1 tablet (81 mg total) by mouth daily. (Patient not taking: Reported on 05/18/2020)   . [DISCONTINUED] amoxicillin-clavulanate (AUGMENTIN) 875-125 MG tablet Take 1 tablet by mouth 2 (two) times daily. (Patient not taking: Reported on 05/18/2020)   . [DISCONTINUED] Chlorphen-PE-Acetaminophen (NOREL AD) 4-10-325 MG TABS Take 1-2 tablets every 8 hours as needed. (Patient not taking: Reported on 09/15/2019) 09/02/2019: For allergy PRN  . [DISCONTINUED] nicotine (NICODERM CQ) 7 mg/24hr patch Place 1 patch (7 mg total) onto the skin daily. (Patient not taking: Reported on 09/15/2019)    No facility-administered encounter medications on file as of 05/18/2020.     ROS: Pertinent positives and negatives noted in HPI. Remainder of ROS non-contributory    Allergies  Allergen Reactions  . Hydrocodone Rash  . Demerol [Meperidine] Nausea And Vomiting    severe  . Molds & Smuts   . Sulfa Antibiotics Swelling and Rash    BP (!) 160/86 (BP Location: Left Arm, Cuff Size: Normal)   Pulse 78   Temp 98.7 F (37.1 C) (Temporal)   Ht 5\' 8"  (1.727 m)   Wt 175 lb 6.4 oz (79.6 kg)   SpO2 98%   BMI 26.67 kg/m    BP Readings from Last 3 Encounters:  05/18/20 (!) 160/86  11/10/19 122/70  05/31/19 112/76     Physical Exam  Constitutional: She is oriented to person, place, and time. She appears well-developed and well-nourished. No distress.  Pulmonary/Chest: No respiratory distress.  Musculoskeletal:        General: No edema.  Neurological: She is alert and oriented to person, place, and time. Coordination normal.  Psychiatric: She has a normal mood and affect. Her behavior is normal.     A/P:  1. Hyperlipidemia, unspecified hyperlipidemia type - Lipid  panel  2. Elevated fasting blood sugar - Hemoglobin A1c  3. DDD (degenerative disc disease), lumbar 5. DDD (degenerative disc disease), cervical - Ambulatory referral to Physical Therapy - pt plans to resume seeing chiropractor  - she has followed with ortho in the past but does not want to f/u - pt will consider referral to PM&R - info included in AVS  4. Fibromyalgia - pt is reluctant  to try any Rx med including cymbalta. She plans to resume seeing chiropractor. No regular exercise

## 2020-05-19 ENCOUNTER — Ambulatory Visit: Payer: BC Managed Care – PPO | Admitting: Family Medicine

## 2020-05-22 ENCOUNTER — Telehealth: Payer: Self-pay | Admitting: General Practice

## 2020-05-22 DIAGNOSIS — R7301 Impaired fasting glucose: Secondary | ICD-10-CM

## 2020-05-22 DIAGNOSIS — E785 Hyperlipidemia, unspecified: Secondary | ICD-10-CM

## 2020-05-22 NOTE — Telephone Encounter (Signed)
Patient called and wanted to see if result were back yet. Pls advise. CB is [phone]

## 2020-05-23 NOTE — Telephone Encounter (Signed)
Pt informed that Dr. Loletha Grayer will result any labs during days here in office.

## 2020-05-24 NOTE — Telephone Encounter (Signed)
Pts A1C = 6.0 which indicates that she is prediabetic (5.7-6.4). low carb, low sugar diet and regular exercise can help lower this.  Her triglycerides improved from 188 to 159 (goal is under 150). However her total and LDL cholesterol both increased. Total 205 --> 242 (goal less than 200) and LDL 121 --> 168 (goal under 100).  Based on her cardiovascular risk assessment score (risk of CV event like heart attack or stroke in next 10 years), she should be on a med to help lower cholesterol and reduce risk. Is this something she is agreeable to? If so, I will send Rx to pharm

## 2020-05-25 NOTE — Telephone Encounter (Signed)
Pt would like for Dr. Loletha Grayer to call her because she is confused about the numbers.  Please advise.

## 2020-05-25 NOTE — Telephone Encounter (Signed)
Called and spoke with pt, all questions answered.  Pt is working to cut back on smoking.  She does feel her diet can improve and activity level, although feels it is hard w/ her husband sick (cancer). She is swimming daily in her pool and replacing 1 meal per day w/ smoothie.  She will plan to f/u and check labs in 3-52mo

## 2020-07-12 DIAGNOSIS — Z6826 Body mass index (BMI) 26.0-26.9, adult: Secondary | ICD-10-CM | POA: Diagnosis not present

## 2020-07-12 DIAGNOSIS — Z01419 Encounter for gynecological examination (general) (routine) without abnormal findings: Secondary | ICD-10-CM | POA: Diagnosis not present

## 2020-07-12 DIAGNOSIS — Z1231 Encounter for screening mammogram for malignant neoplasm of breast: Secondary | ICD-10-CM | POA: Diagnosis not present

## 2020-07-19 ENCOUNTER — Telehealth: Payer: Self-pay | Admitting: General Practice

## 2020-07-19 NOTE — Telephone Encounter (Signed)
Patient is calling and stated she wanted to get the covid vaccine and is experiencing a headache and pressure in her ear. Patient wanted to see if she could still get the vaccine, please advise. CB is [phone]

## 2020-07-19 NOTE — Telephone Encounter (Signed)
Pt can wait a few days for symptoms to resolve if she would like but they are not a reason to not get the vaccine. If she has an appt scheduled for the vaccine, I would proceed with it.

## 2020-07-19 NOTE — Telephone Encounter (Signed)
Please see message and advise.  Thank you. ° °

## 2020-07-20 NOTE — Telephone Encounter (Signed)
Patient notified and states she would like to wait because she had her covid vaccine today and spoke with the pharmacist and they informed her it sounded more like sinus issues. Patient states she is just going to wait to see if it gets better and will contact our office if needed.

## 2020-07-20 NOTE — Telephone Encounter (Signed)
Please see message and advise 

## 2020-07-20 NOTE — Telephone Encounter (Signed)
Patient called back and I informed patient of Dr. Loletha Grayer message and also wanted to see if Dr. Cathleen Corti could send her something to the pharmacy for ear pain, please advise. CB is [phone]

## 2020-07-20 NOTE — Telephone Encounter (Signed)
Pt needs appt to have ear pain evaluated to try to determine cause and what would be needed to treat it

## 2020-08-04 ENCOUNTER — Telehealth: Payer: Self-pay | Admitting: Family Medicine

## 2020-08-04 NOTE — Telephone Encounter (Signed)
Patient had 1st dose of Moderna vaccine and has swelling in her arm. She wants to know if she should wait to get the 2nd one that's scheduled for 2 weeks or advise her on what she should do regarding the swelling. Please call her back at [phone].

## 2020-08-07 NOTE — Telephone Encounter (Signed)
Pt states she can not afford to do that right now because the last vv was expensive due to her insurance. Patient states she will just apply a warm compress to her arm and "take her chances" because she does not want to do a vv or come into the office at this time. I informed the patient I would make the provider aware.

## 2020-08-07 NOTE — Telephone Encounter (Signed)
Please advise 

## 2020-08-07 NOTE — Telephone Encounter (Signed)
I have an opening tomorrow at 8am, Schedule for video appt to eval her arm. Thank you

## 2020-08-07 NOTE — Telephone Encounter (Signed)
Patient still has swelling under her arm and would like advice on helping this today as well as whether she should wait to get the 2nd shot. Forwarding to Doc of the Day.

## 2020-08-22 ENCOUNTER — Encounter: Payer: Self-pay | Admitting: Nurse Practitioner

## 2020-08-22 ENCOUNTER — Other Ambulatory Visit: Payer: Self-pay

## 2020-08-22 ENCOUNTER — Ambulatory Visit (INDEPENDENT_AMBULATORY_CARE_PROVIDER_SITE_OTHER): Payer: BC Managed Care – PPO | Admitting: Nurse Practitioner

## 2020-08-22 VITALS — BP 140/70 | HR 76 | Temp 97.7°F | Ht 68.0 in | Wt 173.6 lb

## 2020-08-22 DIAGNOSIS — M79622 Pain in left upper arm: Secondary | ICD-10-CM

## 2020-08-22 NOTE — Progress Notes (Signed)
   Subjective:  Patient ID: [name] [name], female    DOB: May 08, 1956  Age: 64 y.o. MRN: [id number]  CC: Acute Visit (Pt c/o swelling under left arm x1 month. Pt states she had her COVID vaccine and the swelling started after that. )  HPI [name] [name] presents with concerns about left axilla swelling post moderna vaccine 69month ago. She had screening mammogram completed 1week prior to COVID vaccine and was normal per patient.  Reviewed past Medical, Social and Family history today.  Outpatient Medications Prior to Visit  Medication Sig Dispense Refill  . ALPRAZolam (XANAX) 0.25 MG tablet TAKE 1 TABLET BY MOUTH DAILY AS NEEDED 30 tablet 5  . calcium carbonate (OS-CAL) 600 MG TABS Take 1 tablet by mouth 2 (two) times daily. Bone-Up Supp.    . Cholecalciferol (VITAMIN D3) 2000 UNITS TABS Take 1 capsule by mouth daily.    . fluticasone (FLONASE) 50 MCG/ACT nasal spray Place 2 sprays into both nostrils daily. 16 g 6  . Magnesium 250 MG TABS Take 1 tablet by mouth daily.    . Omega-3 Fatty Acids (OMEGA 3 PO) Take by mouth.    Marland Kitchen aspirin EC 81 MG tablet Take 1 tablet (81 mg total) by mouth daily. (Patient not taking: Reported on 08/22/2020)    . magnesium gluconate (MAGONATE) 500 MG tablet Take by mouth. (Patient not taking: Reported on 08/22/2020)    . vitamin E 400 UNIT capsule Take 400 Units by mouth daily. (Patient not taking: Reported on 08/22/2020)     No facility-administered medications prior to visit.    ROS See HPI  Objective:  BP 140/70 (BP Location: Left Arm, Patient Position: Sitting, Cuff Size: Normal)   Pulse 76   Temp 97.7 F (36.5 C) (Temporal)   Ht 5\' 8"  (1.727 m)   Wt 173 lb 9.6 oz (78.7 kg)   SpO2 98%   BMI 26.40 kg/m   Physical Exam Pulmonary:     Effort: Pulmonary effort is normal.  Chest:     Breasts:        Right: Normal.        Left: Normal.  Lymphadenopathy:     Cervical: No cervical adenopathy.     Upper Body:     Right upper body: No supraclavicular,  axillary or pectoral adenopathy.     Left upper body: No supraclavicular, axillary or pectoral adenopathy.  Skin:    Findings: No erythema or rash.    Assessment & Plan:  This visit occurred during the SARS-CoV-2 public health emergency.  Safety protocols were in place, including screening questions prior to the visit, additional usage of staff PPE, and extensive cleaning of exam room while observing appropriate contact time as indicated for disinfecting solutions.   [name] [name] was seen today for acute visit.  Diagnoses and all orders for this visit:  Tenderness of left axilla  No swollen lymph nodes or visible asymmetry. Advised [name] [name] to proceed with second Moderna vaccine.  Problem List Items Addressed This Visit    None    Visit Diagnoses    Tenderness of left axilla    -  Primary      Follow-up: No follow-ups on file.  Wilfred Lacy, NP

## 2020-08-22 NOTE — Patient Instructions (Signed)
No swollen lymph nodes Ok to proceed with second COVID vaccine

## 2020-08-28 ENCOUNTER — Ambulatory Visit (INDEPENDENT_AMBULATORY_CARE_PROVIDER_SITE_OTHER): Payer: BC Managed Care – PPO | Admitting: Otolaryngology

## 2020-08-28 ENCOUNTER — Encounter (INDEPENDENT_AMBULATORY_CARE_PROVIDER_SITE_OTHER): Payer: Self-pay | Admitting: Otolaryngology

## 2020-08-28 ENCOUNTER — Other Ambulatory Visit: Payer: Self-pay

## 2020-08-28 VITALS — Temp 97.2°F

## 2020-08-28 DIAGNOSIS — J310 Chronic rhinitis: Secondary | ICD-10-CM | POA: Diagnosis not present

## 2020-08-28 NOTE — Progress Notes (Signed)
HPI: [name] [name] is a 64 y.o. female who presents for evaluation of swelling of her right cheek region.  She also has chronic right posterior neck pain that she sees a chiropractor for but has not seen one recently.  She reports that she had a lesion removed from her right mandible by Dr. Loyal Gambler about a year ago.  She also complains of itching in her ears.  She has a pool and swims but tries to keep the water out of her ears. Her husband was diagnosed with lymphoma this past January and has just finished chemotherapy and radiation therapy. She is concerned about having infection that she may pass on to her husband.  Past Medical History:  Diagnosis Date  . Anxiety   . Arthritis    HANDS,  KNEES  . Bone tumor (benign)    RIGHT FEMUR  . Borderline hypertension   . Female pelvic pain   . Fibromyalgia   . Left knee injury    TENDONDINITIS/  CHONDRAMALIA  . PONV (postoperative nausea and vomiting)    Past Surgical History:  Procedure Laterality Date  . DILATION AND CURETTAGE OF UTERUS  1986  &  1988   RETAINED PLACENTA  . LAPAROSCOPIC ASSISTED VAGINAL HYSTERECTOMY  01-25-2003  . LAPAROSCOPY N/A 04/18/2014   Procedure: LAPAROSCOPY DIAGNOSTIC;  Surgeon: Margarette Asal, MD;  Location: [facility];  Service: Gynecology;  Laterality: N/A;  . NASAL SEPTUM SURGERY  AGE 84  . SALPINGOOPHORECTOMY Bilateral 04/18/2014   Procedure: SALPINGO OOPHORECTOMY;  Surgeon: Margarette Asal, MD;  Location: [facility];  Service: Gynecology;  Laterality: Bilateral;  . TONSILLECTOMY  AGE 58  . TUBAL LIGATION     Social History   Socioeconomic History  . Marital status: Married    Spouse name: Not on file  . Number of children: Not on file  . Years of education: Not on file  . Highest education level: Not on file  Occupational History  . Not on file  Tobacco Use  . Smoking status: Current Every Day Smoker    Packs/day: 0.50    Years: 42.00    Pack years: 21.00     Types: Cigarettes    Start date: 75  . Smokeless tobacco: Never Used  Substance and Sexual Activity  . Alcohol use: Yes    Comment: rare  . Drug use: No  . Sexual activity: Not on file  Other Topics Concern  . Not on file  Social History Narrative  . Not on file   Social Determinants of Health   Financial Resource Strain:   . Difficulty of Paying Living Expenses: Not on file  Food Insecurity:   . Worried About Charity fundraiser in the Last Year: Not on file  . Ran Out of Food in the Last Year: Not on file  Transportation Needs:   . Lack of Transportation (Medical): Not on file  . Lack of Transportation (Non-Medical): Not on file  Physical Activity:   . Days of Exercise per Week: Not on file  . Minutes of Exercise per Session: Not on file  Stress:   . Feeling of Stress : Not on file  Social Connections:   . Frequency of Communication with Friends and Family: Not on file  . Frequency of Social Gatherings with Friends and Family: Not on file  . Attends Religious Services: Not on file  . Active Member of Clubs or Organizations: Not on file  . Attends Club or  Organization Meetings: Not on file  . Marital Status: Not on file   Family History  Problem Relation Age of Onset  . Colon cancer Neg Hx   . Stomach cancer Neg Hx    Allergies  Allergen Reactions  . Hydrocodone Rash  . Demerol [Meperidine] Nausea And Vomiting    severe  . Molds & Smuts   . Sulfa Antibiotics Swelling and Rash   Prior to Admission medications   Medication Sig Start Date End Date Taking? Authorizing Provider  ALPRAZolam Duanne Moron) 0.25 MG tablet TAKE 1 TABLET BY MOUTH DAILY AS NEEDED 06/04/19  Yes Lucille Passy, MD  aspirin EC 81 MG tablet Take 1 tablet (81 mg total) by mouth daily. 06/01/19  Yes Lucille Passy, MD  calcium carbonate (OS-CAL) 600 MG TABS Take 1 tablet by mouth 2 (two) times daily. Bone-Up Supp.   Yes [provider]  Cholecalciferol (VITAMIN D3) 2000 UNITS TABS Take 1 capsule  by mouth daily.   Yes [provider]  fluticasone (FLONASE) 50 MCG/ACT nasal spray Place 2 sprays into both nostrils daily. 01/19/18  Yes Libby Maw, MD  Magnesium 250 MG TABS Take 1 tablet by mouth daily.   Yes [provider]  magnesium gluconate (MAGONATE) 500 MG tablet Take by mouth.    Yes [provider]  Omega-3 Fatty Acids (OMEGA 3 PO) Take by mouth.   Yes [provider]  vitamin E 400 UNIT capsule Take 400 Units by mouth daily.    Yes [provider]     Positive ROS: Otherwise negative  All other systems have been reviewed and were otherwise negative with the exception of those mentioned in the HPI and as above.  Physical Exam: Constitutional: Alert, well-appearing, no acute distress Ears: External ears without lesions or tenderness. Ear canals are clear bilaterally with intact, clear TMs bilaterally with no signs of infection. Nasal: External nose without lesions. Septum with minimal deformity and mild rhinitis.  After decongesting the nose both middle meatus regions were clear with no clinical evidence of active infection.  Mucus within the nasal cavity is clear.. Clear nasal passages otherwise. Oral: Lips and gums without lesions. Tongue and palate mucosa without lesions. Posterior oropharynx clear.  Patient is status post tonsillectomy.  Indirect laryngoscopy revealed a clear base of tongue vallecula and epiglottis. Neck: No palpable adenopathy or masses.  There is no palpable adenopathy on either side of the neck. Respiratory: Breathing comfortably  Skin: No facial/neck lesions or rash noted.  She does have slight fullness in the region of her right nasolabial fold compared to the left but this is minimal and is soft to palpation there is no erythema and no palpable mass although on palpation on the right side it is slightly tender.  Procedures  Assessment: Allergic rhinitis No clinical evidence of active  infection.  Plan: Recommend the use of her Flonase which she is using at night instead of in the morning and use it regularly as well as possible use of saline irrigations. I do not appreciate any active infection however if she continues have chronic pain in the right cheek area may want sinus CT scan down the road. She will follow-up as needed  Radene Journey, MD

## 2020-09-06 ENCOUNTER — Telehealth: Payer: Self-pay | Admitting: General Practice

## 2020-09-06 DIAGNOSIS — F4323 Adjustment disorder with mixed anxiety and depressed mood: Secondary | ICD-10-CM

## 2020-09-06 MED ORDER — ALPRAZOLAM 0.25 MG PO TABS: 0.2500 mg | ORAL_TABLET | Freq: Every day | ORAL | 2 refills | Status: DC | PRN

## 2020-09-06 NOTE — Telephone Encounter (Signed)
Patient is calling to see if she can get a refill on her Xanax. Last prescribed by Dr. Deborra Medina. If approved, please send to [facility] and call her at [phone] or [phone] to let her know that it has been sent in.

## 2020-09-06 NOTE — Telephone Encounter (Signed)
Rx refilled. Pt needs OV q18mo, so due in 11/2020 since this is a controlled substance

## 2020-09-06 NOTE — Telephone Encounter (Signed)
Dr. Loletha Grayer please advise.  Pt is wanting a refill on her:  Xanax 30 tabs   5-refills Last fill-06/04/2019 Last OV-08/22/2020  Pt need to make an appointment for anxiety?

## 2020-09-07 NOTE — Telephone Encounter (Signed)
Left pt a voicemail per DPR that her refill was filled at her pharmacy and that she must make 3 month follow ups with a controlled substance and to call our office and schedule her first 3 month appointment in 11/2020.

## 2020-09-11 ENCOUNTER — Other Ambulatory Visit: Payer: Self-pay

## 2020-09-11 ENCOUNTER — Emergency Department (HOSPITAL_COMMUNITY)
Admission: EM | Admit: 2020-09-11 | Discharge: 2020-09-12 | Disposition: A | Discharge status: Home / Self Care | Payer: BC Managed Care – PPO | Attending: Emergency Medicine | Admitting: Emergency Medicine

## 2020-09-11 ENCOUNTER — Emergency Department (HOSPITAL_COMMUNITY): Payer: BC Managed Care – PPO

## 2020-09-11 ENCOUNTER — Encounter (HOSPITAL_COMMUNITY): Payer: Self-pay

## 2020-09-11 DIAGNOSIS — S6292XA Unspecified fracture of left wrist and hand, initial encounter for closed fracture: Secondary | ICD-10-CM | POA: Insufficient documentation

## 2020-09-11 DIAGNOSIS — S0512XA Contusion of eyeball and orbital tissues, left eye, initial encounter: Secondary | ICD-10-CM | POA: Diagnosis not present

## 2020-09-11 DIAGNOSIS — M7989 Other specified soft tissue disorders: Secondary | ICD-10-CM | POA: Diagnosis not present

## 2020-09-11 DIAGNOSIS — Z7982 Long term (current) use of aspirin: Secondary | ICD-10-CM | POA: Diagnosis not present

## 2020-09-11 DIAGNOSIS — Y9301 Activity, walking, marching and hiking: Secondary | ICD-10-CM | POA: Insufficient documentation

## 2020-09-11 DIAGNOSIS — W010XXA Fall on same level from slipping, tripping and stumbling without subsequent striking against object, initial encounter: Secondary | ICD-10-CM | POA: Diagnosis not present

## 2020-09-11 DIAGNOSIS — Y92002 Bathroom of unspecified non-institutional (private) residence single-family (private) house as the place of occurrence of the external cause: Secondary | ICD-10-CM | POA: Diagnosis not present

## 2020-09-11 DIAGNOSIS — S80211A Abrasion, right knee, initial encounter: Secondary | ICD-10-CM | POA: Diagnosis not present

## 2020-09-11 DIAGNOSIS — I10 Essential (primary) hypertension: Secondary | ICD-10-CM | POA: Insufficient documentation

## 2020-09-11 DIAGNOSIS — F17210 Nicotine dependence, cigarettes, uncomplicated: Secondary | ICD-10-CM | POA: Diagnosis not present

## 2020-09-11 DIAGNOSIS — S52502A Unspecified fracture of the lower end of left radius, initial encounter for closed fracture: Secondary | ICD-10-CM | POA: Diagnosis not present

## 2020-09-11 DIAGNOSIS — S6992XA Unspecified injury of left wrist, hand and finger(s), initial encounter: Secondary | ICD-10-CM | POA: Diagnosis not present

## 2020-09-11 DIAGNOSIS — S62102A Fracture of unspecified carpal bone, left wrist, initial encounter for closed fracture: Secondary | ICD-10-CM

## 2020-09-11 DIAGNOSIS — S52612A Displaced fracture of left ulna styloid process, initial encounter for closed fracture: Secondary | ICD-10-CM | POA: Diagnosis not present

## 2020-09-11 DIAGNOSIS — W19XXXA Unspecified fall, initial encounter: Secondary | ICD-10-CM

## 2020-09-11 MED ORDER — OXYCODONE-ACETAMINOPHEN 5-325 MG PO TABS
1.0000 | ORAL_TABLET | Freq: Once | ORAL | Status: AC
  Administered 2020-09-12: 1 via ORAL
  Filled 2020-09-11 (×2): qty 1

## 2020-09-11 NOTE — ED Triage Notes (Signed)
Pt states falling and catching self with left wrist. Swelling and possible deformity. Pt has abrasions on bilateral knees and around left eye. Denies LOC.

## 2020-09-12 DIAGNOSIS — S6292XA Unspecified fracture of left wrist and hand, initial encounter for closed fracture: Secondary | ICD-10-CM | POA: Diagnosis not present

## 2020-09-12 MED ORDER — OXYCODONE-ACETAMINOPHEN 5-325 MG PO TABS: 1.0000 | ORAL_TABLET | ORAL | 0 refills | Status: DC | PRN

## 2020-09-12 MED ORDER — OXYCODONE-ACETAMINOPHEN 5-325 MG PO TABS
1.0000 | ORAL_TABLET | Freq: Once | ORAL | Status: AC
  Administered 2020-09-12: 1 via ORAL
  Filled 2020-09-12: qty 1

## 2020-09-12 NOTE — Discharge Instructions (Addendum)
Ice and elevate the left wrist to reduce swelling. Call Dr. Grandville Silos to schedule an appointment for further treatment of wrist fracture. Take Percocet as prescribed for pain relief.   Return to the ED with any new or concerning symptoms.

## 2020-09-12 NOTE — ED Provider Notes (Signed)
[facility] DEPT Provider Note   CSN: [id number] Arrival date & time: 09/11/20  2019     History Chief Complaint  Patient presents with  . Wrist Pain    [name] [name] is a 64 y.o. female.  Patient to ED after fall earlier this evening. She reports she tripped while walking through her bedroom, falling forward, hitting her head on a piece of furniture, scraping her right knee. No LOC, nausea. She tried to catch herself with her left hand and has significant wrist pain and swelling. No neck, back, chest or abdominal pain.   The history is provided by the patient. No language interpreter was used.  Wrist Pain Pertinent negatives include no chest pain, no abdominal pain and no shortness of breath.       Past Medical History:  Diagnosis Date  . Anxiety   . Arthritis    HANDS,  KNEES  . Bone tumor (benign)    RIGHT FEMUR  . Borderline hypertension   . Female pelvic pain   . Fibromyalgia   . Left knee injury    TENDONDINITIS/  CHONDRAMALIA  . PONV (postoperative nausea and vomiting)     Patient Active Problem List   Diagnosis Date Noted  . Acute bacterial rhinosinusitis 09/15/2019  . Osteoporosis 09/02/2019  . Tenderness of right calf 05/31/2019  . Calf swelling 05/31/2019  . Elevated BP without diagnosis of hypertension 01/19/2018  . HLD (hyperlipidemia) 02/22/2016  . Adjustment disorder with mixed anxiety and depressed mood 07/04/2015  . Allergic rhinitis 07/04/2015  . HTN (hypertension) 03/24/2015  . Tobacco abuse 09/01/2012  . Fibromyalgia   . 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 DILATION AND CURETTAGE OF UTERUS  1986  &  1988   RETAINED PLACENTA  . LAPAROSCOPIC ASSISTED VAGINAL HYSTERECTOMY  01-25-2003  . LAPAROSCOPY N/A 04/18/2014   Procedure: LAPAROSCOPY DIAGNOSTIC;  Surgeon: Margarette Asal, MD;  Location: [facility];  Service: Gynecology;   Laterality: N/A;  . NASAL SEPTUM SURGERY  AGE 83  . SALPINGOOPHORECTOMY Bilateral 04/18/2014   Procedure: SALPINGO OOPHORECTOMY;  Surgeon: Margarette Asal, MD;  Location: [facility];  Service: Gynecology;  Laterality: Bilateral;  . TONSILLECTOMY  AGE 5  . TUBAL LIGATION       OB History   No obstetric history on file.     Family History  Problem Relation Age of Onset  . Colon cancer Neg Hx   . Stomach cancer Neg Hx     Social History   Tobacco Use  . Smoking status: Current Every Day Smoker    Packs/day: 0.50    Years: 42.00    Pack years: 21.00    Types: Cigarettes    Start date: 31  . Smokeless tobacco: Never Used  Substance Use Topics  . Alcohol use: Yes    Comment: rare  . Drug use: No    Home Medications Prior to Admission medications   Medication Sig Start Date End Date Taking? Authorizing Provider  ALPRAZolam (XANAX) 0.25 MG tablet Take 1 tablet (0.25 mg total) by mouth daily as needed. 09/06/20   Cirigliano, Garvin Fila, DO  aspirin EC 81 MG tablet Take 1 tablet (81 mg total) by mouth daily. 06/01/19   Lucille Passy, MD  calcium carbonate (OS-CAL) 600 MG TABS Take 1 tablet by mouth 2 (two) times daily. Bone-Up Supp.    [provider]  Cholecalciferol (VITAMIN  D3) 2000 UNITS TABS Take 1 capsule by mouth daily.    [provider]  fluticasone (FLONASE) 50 MCG/ACT nasal spray Place 2 sprays into both nostrils daily. 01/19/18   Libby Maw, MD  Magnesium 250 MG TABS Take 1 tablet by mouth daily.    [provider]  magnesium gluconate (MAGONATE) 500 MG tablet Take by mouth.     [provider]  Omega-3 Fatty Acids (OMEGA 3 PO) Take by mouth.    [provider]  vitamin E 400 UNIT capsule Take 400 Units by mouth daily.     [provider]    Allergies    Hydrocodone, Demerol [meperidine], Molds & smuts, and Sulfa antibiotics  Review of Systems   Review of Systems  Constitutional:  Negative for diaphoresis.  HENT: Negative.  Negative for facial swelling and nosebleeds.   Eyes: Negative for visual disturbance.  Respiratory: Negative.  Negative for cough and shortness of breath.   Cardiovascular: Negative.  Negative for chest pain.  Gastrointestinal: Negative.  Negative for abdominal pain and nausea.  Genitourinary: Negative.  Negative for hematuria.  Musculoskeletal: Negative for back pain and neck pain.       Left wrist pain, right knee pain  Skin: Positive for wound (right knee).  Neurological: Negative.  Negative for syncope and weakness.    Physical Exam Updated Vital Signs BP (!) 178/86 (BP Location: Right Arm)   Pulse 72   Temp 97.7 F (36.5 C) (Oral)   Resp 18   Ht 5\' 8"  (1.727 m)   Wt 78.7 kg   SpO2 97%   BMI 26.40 kg/m   Physical Exam Vitals and nursing note reviewed.  Constitutional:      Appearance: She is well-developed.  HENT:     Head: Normocephalic.     Comments: Contusion to left lateral eye brow without significant swelling or bruising. Eyes:     Conjunctiva/sclera: Conjunctivae normal.     Pupils: Pupils are equal, round, and reactive to light.  Cardiovascular:     Rate and Rhythm: Normal rate.  Pulmonary:     Effort: Pulmonary effort is normal.     Breath sounds: Normal breath sounds.  Chest:     Chest wall: No tenderness.  Abdominal:     General: Bowel sounds are normal.     Palpations: Abdomen is soft.     Tenderness: There is no abdominal tenderness. There is no guarding or rebound.  Musculoskeletal:        General: Normal range of motion.     Cervical back: Normal range of motion and neck supple.     Comments: Right knee has no bony deformity or significant swelling. There is an abrasion over anterior surface. FROM. Joint stable. Left wrist significantly swollen over volar surface. No definite bony deformity.  No midline cervical tenderness.   Skin:    General: Skin is warm and dry.     Findings: No rash.    Neurological:     Mental Status: She is alert and oriented to person, place, and time.     Sensory: No sensory deficit.     Coordination: Coordination normal.     Comments: Left wrist and hand neurovascularly intact.      ED Results / Procedures / Treatments   Labs (all labs ordered are listed, but only abnormal results are displayed) Labs Reviewed - No data to display  EKG None  Radiology DG Wrist Complete Left  Result Date: 09/11/2020 CLINICAL DATA:  Status post fall. EXAM: LEFT WRIST - COMPLETE 3+ VIEW COMPARISON:  None. FINDINGS: Acute, comminuted fracture deformity is seen involving the distal left radius. Extension to the radiocarpal articulation is noted. A mildly displaced fracture of the left ulnar styloid is also seen. There is no evidence of dislocation. Mild to moderate severity diffuse soft tissue swelling is noted. IMPRESSION: Acute fractures of the distal left radius and left ulnar styloid. Electronically Signed   By: Virgina Norfolk M.D.   On: 09/11/2020 21:12    Procedures Procedures (including critical care time)  Medications Ordered in ED Medications  oxyCODONE-acetaminophen (PERCOCET/ROXICET) 5-325 MG per tablet 1 tablet (has no administration in time range)  oxyCODONE-acetaminophen (PERCOCET/ROXICET) 5-325 MG per tablet 1 tablet (1 tablet Oral Given 09/12/20 0006)    ED Course  I have reviewed the triage vital signs and the nursing notes.  Pertinent labs & imaging results that were available during my care of the patient were reviewed by me and considered in my medical decision making (see chart for details).    MDM Rules/Calculators/A&P                          Patient to ED after mechanical fall with left wrist pain/swelling, severe, and mild right knee abrasion.   She hit her head but without LOC or neurologic deficit on exam. Left wrist has a radial fracture, ulnar styloid fx. Sugar tong splint applied. Pain medication provided.   Do not feel  there is bony injury of the right knee. No other injury identified. She is felt appropriate for discharge home with hand ortho follow up for wrist fractures. Final Clinical Impression(s) / ED Diagnoses Final diagnoses:  None   1. Fall 2. Left wrist fracture 3. Right knee abrasion  Rx / DC Orders ED Discharge Orders    None       Charlann Lange, PA-C 09/12/20 0118    Merryl Hacker, MD 09/12/20 [id number]

## 2020-09-12 NOTE — Progress Notes (Signed)
Orthopedic Tech Progress Note Patient Details:  [name] [name] 29-Mar-1956 [id number]  Ortho Devices Type of Ortho Device: Arm sling, Sugartong splint Ortho Device/Splint Location: lue. pt had pain during application. Ortho Device/Splint Interventions: Ordered, Application, Adjustment   Post Interventions Patient Tolerated: Fair Instructions Provided: Care of device, Adjustment of device   Karolee Stamps 09/12/2020, 1:41 AM

## 2020-09-14 ENCOUNTER — Encounter (HOSPITAL_BASED_OUTPATIENT_CLINIC_OR_DEPARTMENT_OTHER): Payer: Self-pay | Admitting: Orthopedic Surgery

## 2020-09-14 ENCOUNTER — Other Ambulatory Visit: Payer: Self-pay

## 2020-09-14 ENCOUNTER — Other Ambulatory Visit: Payer: Self-pay | Admitting: Orthopedic Surgery

## 2020-09-14 DIAGNOSIS — S52572A Other intraarticular fracture of lower end of left radius, initial encounter for closed fracture: Secondary | ICD-10-CM | POA: Diagnosis not present

## 2020-09-15 ENCOUNTER — Other Ambulatory Visit (HOSPITAL_COMMUNITY)
Admission: RE | Admit: 2020-09-15 | Discharge: 2020-09-15 | Disposition: A | Discharge status: Home / Self Care | Payer: BC Managed Care – PPO | Source: Ambulatory Visit | Attending: Orthopedic Surgery | Admitting: Orthopedic Surgery

## 2020-09-15 DIAGNOSIS — Z01812 Encounter for preprocedural laboratory examination: Secondary | ICD-10-CM | POA: Diagnosis not present

## 2020-09-15 DIAGNOSIS — Z20822 Contact with and (suspected) exposure to covid-19: Secondary | ICD-10-CM | POA: Insufficient documentation

## 2020-09-15 LAB — SARS CORONAVIRUS 2 (TAT 6-24 HRS): SARS Coronavirus 2: NEGATIVE

## 2020-09-17 NOTE — H&P (Signed)
[name] [name] is an 64 y.o. female.   CC / Reason for Visit: Left wrist injury HPI: This patient presents for evaluation of an acute injury that occurred a few days ago when she tripped in her bedroom on a cord from the [place], landing onto an outstretched left hand.  She scraped her right knee, struck her left side of her face above the eyebrow, and injured her left wrist.  She was evaluated in emergency department and placed into a sugar tong splint.  She has been using Percocet with some interspersed ibuprofen.  She reports reasonable degree of pain with this regimen.  Past Medical History:  Diagnosis Date  . Anxiety   . Arthritis    HANDS,  KNEES  . Bone tumor (benign)    RIGHT FEMUR  . Borderline hypertension   . Female pelvic pain   . Fibromyalgia   . Left knee injury    TENDONDINITIS/  CHONDRAMALIA  . PONV (postoperative nausea and vomiting)     Past Surgical History:  Procedure Laterality Date  . DILATION AND CURETTAGE OF UTERUS  1986  &  1988   RETAINED PLACENTA  . LAPAROSCOPIC ASSISTED VAGINAL HYSTERECTOMY  01-25-2003  . LAPAROSCOPY N/A 04/18/2014   Procedure: LAPAROSCOPY DIAGNOSTIC;  Surgeon: Margarette Asal, MD;  Location: [facility];  Service: Gynecology;  Laterality: N/A;  . NASAL SEPTUM SURGERY  AGE 65  . SALPINGOOPHORECTOMY Bilateral 04/18/2014   Procedure: SALPINGO OOPHORECTOMY;  Surgeon: Margarette Asal, MD;  Location: [facility];  Service: Gynecology;  Laterality: Bilateral;  . TONSILLECTOMY  AGE 3  . TUBAL LIGATION      Family History  Problem Relation Age of Onset  . Colon cancer Neg Hx   . Stomach cancer Neg Hx    Social History:  reports that she has been smoking cigarettes. She started smoking about 42 years ago. She has a 21.00 pack-year smoking history. She has never used smokeless tobacco. She reports current alcohol use. She reports that she does not use drugs.  Allergies:  Allergies  Allergen Reactions  .  Hydrocodone Rash  . Demerol [Meperidine] Nausea And Vomiting    severe  . Molds & Smuts   . Sulfa Antibiotics Swelling and Rash    No medications prior to admission.    No results found for this or any previous visit (from the past 48 hour(s)). No results found.  Review of Systems  All other systems reviewed and are negative.   Height 5\' 8"  (1.727 m), weight 78.5 kg. Physical Exam  Constitutional:  WD, WN, NAD HEENT:  NCAT, EOMI Neuro/Psych:  Alert & oriented to person, place, and time; appropriate mood & affect Lymphatic: No generalized UE edema or lymphadenopathy Extremities / MSK:  Both UE are normal with respect to appearance, ranges of motion, joint stability, muscle strength/tone, sensation, & perfusion except as otherwise noted:  There is a sugar tong splint on the left upper extremity.  It extends too far distally on the ulnar side to allow for good MP flexion of the ring and small fingers.  Intact light touch sensibility on the radial, ulnar, and median nerve distributions with intact motor to the same.  Global tenderness about the wrist, not so tender with palpation about the elbow  Labs / Xrays:  No radiographic studies obtained today.  X-rays from the emergency department from 09-11-20 are reviewed and reveal a comminuted fracture of the distal radius with intra-articular extension, particularly to the DRUJ.  There is mushrooming of the end of the radius, leaving it resultantly flattened with regard to inclination, shortened, with some moderate degree of dorsal tilt.  There is also a small minimally displaced ulnar styloid fracture.  Assessment: 1.  Right trigger thumb-injected 01/18/2020, 02/15/2020 2.  Right TMC osteoarthritis 3.  Left TMC osteoarthritis 4.  Acute left intra-articular distal radius fracture  Plan:  Today's findings were reviewed with her.  Her splint was modified with a cast saw to allow for better flexion at the ring and small finger MP joints.  I  reviewed appropriate motion exercises with her.  I used a model to show her how the radius interacts with the ulna, the types of concerns that exist with her degree of displacement, what the plate and screws look like and their function.  I recommended operative treatment to obtain and maintain a more anatomic alignment and stability to promote restoration of function.  The details of the operative procedure were discussed with the patient.  Questions were invited and answered.  In addition to the goal of the procedure, the risks of the procedure to include but not limited to bleeding; infection; damage to the nerves or blood vessels that could result in bleeding, numbness, weakness, chronic pain, and the need for additional procedures; stiffness; the need for revision surgery; and anesthetic risks were reviewed.  No specific outcome was guaranteed or implied.  Informed consent was obtained.  We will plan to proceed on Monday.  Jolyn Nap, MD 09/17/2020, 8:13 PM

## 2020-09-18 ENCOUNTER — Ambulatory Visit (HOSPITAL_BASED_OUTPATIENT_CLINIC_OR_DEPARTMENT_OTHER): Payer: BC Managed Care – PPO | Admitting: Anesthesiology

## 2020-09-18 ENCOUNTER — Ambulatory Visit (HOSPITAL_BASED_OUTPATIENT_CLINIC_OR_DEPARTMENT_OTHER)
Admission: RE | Admit: 2020-09-18 | Discharge: 2020-09-18 | Disposition: A | Discharge status: Home / Self Care | Payer: BC Managed Care – PPO | Attending: Orthopedic Surgery | Admitting: Orthopedic Surgery

## 2020-09-18 ENCOUNTER — Ambulatory Visit (HOSPITAL_COMMUNITY): Payer: BC Managed Care – PPO

## 2020-09-18 ENCOUNTER — Other Ambulatory Visit: Payer: Self-pay

## 2020-09-18 ENCOUNTER — Encounter (HOSPITAL_BASED_OUTPATIENT_CLINIC_OR_DEPARTMENT_OTHER): Payer: Self-pay | Admitting: Orthopedic Surgery

## 2020-09-18 ENCOUNTER — Encounter (HOSPITAL_BASED_OUTPATIENT_CLINIC_OR_DEPARTMENT_OTHER)
Admission: RE | Disposition: A | Discharge status: Home / Self Care | Payer: Self-pay | Source: Home / Self Care | Attending: Orthopedic Surgery

## 2020-09-18 DIAGNOSIS — M19042 Primary osteoarthritis, left hand: Secondary | ICD-10-CM | POA: Diagnosis not present

## 2020-09-18 DIAGNOSIS — W010XXA Fall on same level from slipping, tripping and stumbling without subsequent striking against object, initial encounter: Secondary | ICD-10-CM | POA: Insufficient documentation

## 2020-09-18 DIAGNOSIS — S52572A Other intraarticular fracture of lower end of left radius, initial encounter for closed fracture: Secondary | ICD-10-CM | POA: Diagnosis not present

## 2020-09-18 DIAGNOSIS — M65311 Trigger thumb, right thumb: Secondary | ICD-10-CM | POA: Diagnosis not present

## 2020-09-18 DIAGNOSIS — F17210 Nicotine dependence, cigarettes, uncomplicated: Secondary | ICD-10-CM | POA: Insufficient documentation

## 2020-09-18 DIAGNOSIS — I10 Essential (primary) hypertension: Secondary | ICD-10-CM | POA: Diagnosis not present

## 2020-09-18 DIAGNOSIS — M19041 Primary osteoarthritis, right hand: Secondary | ICD-10-CM | POA: Insufficient documentation

## 2020-09-18 DIAGNOSIS — E785 Hyperlipidemia, unspecified: Secondary | ICD-10-CM | POA: Diagnosis not present

## 2020-09-18 DIAGNOSIS — S62102D Fracture of unspecified carpal bone, left wrist, subsequent encounter for fracture with routine healing: Secondary | ICD-10-CM | POA: Diagnosis not present

## 2020-09-18 DIAGNOSIS — Z419 Encounter for procedure for purposes other than remedying health state, unspecified: Secondary | ICD-10-CM

## 2020-09-18 DIAGNOSIS — J309 Allergic rhinitis, unspecified: Secondary | ICD-10-CM | POA: Diagnosis not present

## 2020-09-18 HISTORY — PX: OPEN REDUCTION INTERNAL FIXATION (ORIF) DISTAL RADIAL FRACTURE: SHX5989

## 2020-09-18 SURGERY — OPEN REDUCTION INTERNAL FIXATION (ORIF) DISTAL RADIUS FRACTURE
Anesthesia: Regional | Site: Wrist | Laterality: Left

## 2020-09-18 MED ORDER — 0.9 % SODIUM CHLORIDE (POUR BTL) OPTIME
TOPICAL | Status: DC | PRN
  Administered 2020-09-18: 1000 mL

## 2020-09-18 MED ORDER — PROMETHAZINE HCL 25 MG/ML IJ SOLN: 6.2500 mg | INTRAMUSCULAR | Status: DC | PRN

## 2020-09-18 MED ORDER — BUPIVACAINE HCL (PF) 0.5 % IJ SOLN
INTRAMUSCULAR | Status: AC
  Filled 2020-09-18: qty 30

## 2020-09-18 MED ORDER — MIDAZOLAM HCL 2 MG/2ML IJ SOLN
INTRAMUSCULAR | Status: AC
  Filled 2020-09-18: qty 2

## 2020-09-18 MED ORDER — PROPOFOL 10 MG/ML IV BOLUS
INTRAVENOUS | Status: AC
  Filled 2020-09-18: qty 20

## 2020-09-18 MED ORDER — DEXAMETHASONE SODIUM PHOSPHATE 10 MG/ML IJ SOLN
INTRAMUSCULAR | Status: DC | PRN
  Administered 2020-09-18: 10 mg via INTRAVENOUS

## 2020-09-18 MED ORDER — BUPIVACAINE-EPINEPHRINE (PF) 0.5% -1:200000 IJ SOLN
INTRAMUSCULAR | Status: DC | PRN
  Administered 2020-09-18: 40 mL via PERINEURAL

## 2020-09-18 MED ORDER — SUGAMMADEX SODIUM 500 MG/5ML IV SOLN
INTRAVENOUS | Status: AC
  Filled 2020-09-18: qty 5

## 2020-09-18 MED ORDER — FENTANYL CITRATE (PF) 100 MCG/2ML IJ SOLN
INTRAMUSCULAR | Status: DC | PRN
Start: 2020-09-18 — End: 2020-09-18
  Administered 2020-09-18 (×2): 50 ug via INTRAVENOUS

## 2020-09-18 MED ORDER — ACETAMINOPHEN 325 MG PO TABS: 650.0000 mg | ORAL_TABLET | Freq: Four times a day (QID) | ORAL | 0 refills | Status: AC

## 2020-09-18 MED ORDER — OXYCODONE HCL 5 MG PO TABS: 5.0000 mg | ORAL_TABLET | Freq: Four times a day (QID) | ORAL | 0 refills | Status: DC | PRN

## 2020-09-18 MED ORDER — FENTANYL CITRATE (PF) 100 MCG/2ML IJ SOLN
INTRAMUSCULAR | Status: AC
  Filled 2020-09-18: qty 2

## 2020-09-18 MED ORDER — PHENYLEPHRINE HCL (PRESSORS) 10 MG/ML IV SOLN
INTRAVENOUS | Status: DC | PRN
  Administered 2020-09-18 (×4): 80 ug via INTRAVENOUS

## 2020-09-18 MED ORDER — CEFAZOLIN SODIUM-DEXTROSE 2-4 GM/100ML-% IV SOLN
2.0000 g | INTRAVENOUS | Status: AC
  Administered 2020-09-18: 2 g via INTRAVENOUS

## 2020-09-18 MED ORDER — ONDANSETRON HCL 4 MG/2ML IJ SOLN
INTRAMUSCULAR | Status: DC | PRN
  Administered 2020-09-18: 4 mg via INTRAVENOUS

## 2020-09-18 MED ORDER — LIDOCAINE 2% (20 MG/ML) 5 ML SYRINGE
INTRAMUSCULAR | Status: AC
  Filled 2020-09-18: qty 5

## 2020-09-18 MED ORDER — MIDAZOLAM HCL 5 MG/5ML IJ SOLN
INTRAMUSCULAR | Status: DC | PRN
  Administered 2020-09-18 (×2): 1 mg via INTRAVENOUS

## 2020-09-18 MED ORDER — DEXAMETHASONE SODIUM PHOSPHATE 10 MG/ML IJ SOLN
INTRAMUSCULAR | Status: AC
  Filled 2020-09-18: qty 1

## 2020-09-18 MED ORDER — FENTANYL CITRATE (PF) 100 MCG/2ML IJ SOLN: 25.0000 ug | INTRAMUSCULAR | Status: DC | PRN

## 2020-09-18 MED ORDER — CEFAZOLIN SODIUM-DEXTROSE 2-4 GM/100ML-% IV SOLN
INTRAVENOUS | Status: AC
  Filled 2020-09-18: qty 100

## 2020-09-18 MED ORDER — DEXAMETHASONE SODIUM PHOSPHATE 4 MG/ML IJ SOLN
INTRAMUSCULAR | Status: DC | PRN
  Administered 2020-09-18: 8 mg via PERINEURAL

## 2020-09-18 MED ORDER — IBUPROFEN 200 MG PO TABS: 600.0000 mg | ORAL_TABLET | Freq: Four times a day (QID) | ORAL | 0 refills | Status: AC

## 2020-09-18 MED ORDER — PROPOFOL 500 MG/50ML IV EMUL
INTRAVENOUS | Status: DC | PRN
  Administered 2020-09-18: 100 ug/kg/min via INTRAVENOUS

## 2020-09-18 MED ORDER — MIDAZOLAM HCL 2 MG/2ML IJ SOLN
2.0000 mg | Freq: Once | INTRAMUSCULAR | Status: AC
  Administered 2020-09-18: 2 mg via INTRAVENOUS

## 2020-09-18 MED ORDER — EPHEDRINE SULFATE 50 MG/ML IJ SOLN
INTRAMUSCULAR | Status: DC | PRN
  Administered 2020-09-18: 5 mg via INTRAVENOUS

## 2020-09-18 MED ORDER — FENTANYL CITRATE (PF) 100 MCG/2ML IJ SOLN
50.0000 ug | Freq: Once | INTRAMUSCULAR | Status: AC
  Administered 2020-09-18: 50 ug via INTRAVENOUS

## 2020-09-18 MED ORDER — CLONIDINE HCL (ANALGESIA) 100 MCG/ML EP SOLN
EPIDURAL | Status: DC | PRN
  Administered 2020-09-18: 100 ug

## 2020-09-18 MED ORDER — LACTATED RINGERS IV SOLN: INTRAVENOUS | Status: DC

## 2020-09-18 MED ORDER — ONDANSETRON HCL 4 MG/2ML IJ SOLN
INTRAMUSCULAR | Status: AC
  Filled 2020-09-18: qty 2

## 2020-09-18 MED ORDER — LIDOCAINE 2% (20 MG/ML) 5 ML SYRINGE
INTRAMUSCULAR | Status: DC | PRN
  Administered 2020-09-18: 50 mg via INTRAVENOUS

## 2020-09-18 MED ORDER — ROCURONIUM BROMIDE 10 MG/ML (PF) SYRINGE
PREFILLED_SYRINGE | INTRAVENOUS | Status: AC
  Filled 2020-09-18: qty 10

## 2020-09-18 SURGICAL SUPPLY — 77 items
APL PRP STRL LF DISP 70% ISPRP (MISCELLANEOUS) ×1
BAND INSRT 18 STRL LF DISP RB (MISCELLANEOUS)
BAND RUBBER #18 3X1/16 STRL (MISCELLANEOUS) IMPLANT
BIT DRILL SOLID 2.0X40MM (BIT) IMPLANT
BIT DRILL SOLID 2.5X40MM (BIT) IMPLANT
BLADE HEX COATED 2.75 (ELECTRODE) ×2 IMPLANT
BLADE MINI RND TIP GREEN BEAV (BLADE) IMPLANT
BLADE SURG 15 STRL LF DISP TIS (BLADE) ×1 IMPLANT
BLADE SURG 15 STRL SS (BLADE) ×2
BNDG CMPR 9X4 STRL LF SNTH (GAUZE/BANDAGES/DRESSINGS) ×1
BNDG COHESIVE 2X5 TAN STRL LF (GAUZE/BANDAGES/DRESSINGS) IMPLANT
BNDG COHESIVE 4X5 TAN STRL (GAUZE/BANDAGES/DRESSINGS) ×2 IMPLANT
BNDG ESMARK 4X9 LF (GAUZE/BANDAGES/DRESSINGS) ×2 IMPLANT
BNDG GAUZE ELAST 4 BULKY (GAUZE/BANDAGES/DRESSINGS) ×2 IMPLANT
BRUSH SCRUB EZ PLAIN DRY (MISCELLANEOUS) IMPLANT
CANISTER SUCT 1200ML W/VALVE (MISCELLANEOUS) ×2 IMPLANT
CHLORAPREP W/TINT 26 (MISCELLANEOUS) ×2 IMPLANT
CORD BIPOLAR FORCEPS 12FT (ELECTRODE) ×2 IMPLANT
COVER BACK TABLE 60X90IN (DRAPES) ×2 IMPLANT
COVER MAYO STAND STRL (DRAPES) ×2 IMPLANT
COVER WAND RF STERILE (DRAPES) IMPLANT
CUFF TOURN SGL QUICK 18X4 (TOURNIQUET CUFF) IMPLANT
CUFF TOURN SGL QUICK 24 (TOURNIQUET CUFF)
CUFF TRNQT CYL 24X4X16.5-23 (TOURNIQUET CUFF) IMPLANT
DRAPE C-ARM 42X72 X-RAY (DRAPES) ×2 IMPLANT
DRAPE EXTREMITY T 121X128X90 (DISPOSABLE) ×2 IMPLANT
DRAPE SURG 17X23 STRL (DRAPES) ×2 IMPLANT
DRILL SOLID 2.0X40MM (BIT)
DRILL SOLID 2.5X40MM (BIT)
DRSG ADAPTIC 3X8 NADH LF (GAUZE/BANDAGES/DRESSINGS) ×2 IMPLANT
DRSG EMULSION OIL 3X3 NADH (GAUZE/BANDAGES/DRESSINGS) IMPLANT
ELECT REM PT RETURN 9FT ADLT (ELECTROSURGICAL) ×2
ELECTRODE REM PT RTRN 9FT ADLT (ELECTROSURGICAL) ×1 IMPLANT
GAUZE SPONGE 4X4 12PLY STRL (GAUZE/BANDAGES/DRESSINGS) ×2 IMPLANT
GAUZE SPONGE 4X4 12PLY STRL LF (GAUZE/BANDAGES/DRESSINGS) ×2 IMPLANT
GLOVE BIO SURGEON STRL SZ7.5 (GLOVE) ×2 IMPLANT
GLOVE BIOGEL PI IND STRL 7.0 (GLOVE) ×1 IMPLANT
GLOVE BIOGEL PI IND STRL 8 (GLOVE) ×1 IMPLANT
GLOVE BIOGEL PI INDICATOR 7.0 (GLOVE) ×1
GLOVE BIOGEL PI INDICATOR 8 (GLOVE) ×1
GLOVE ECLIPSE 6.5 STRL STRAW (GLOVE) ×2 IMPLANT
GOWN STRL REUS W/ TWL LRG LVL3 (GOWN DISPOSABLE) ×2 IMPLANT
GOWN STRL REUS W/TWL LRG LVL3 (GOWN DISPOSABLE) ×4
GOWN STRL REUS W/TWL XL LVL3 (GOWN DISPOSABLE) ×2 IMPLANT
GUIDE AIMING 1.5MM (WIRE) ×4 IMPLANT
NEEDLE HYPO 25X1 1.5 SAFETY (NEEDLE) IMPLANT
NS IRRIG 1000ML POUR BTL (IV SOLUTION) ×2 IMPLANT
PACK BASIN DAY SURGERY FS (CUSTOM PROCEDURE TRAY) ×2 IMPLANT
PADDING CAST ABS 4INX4YD NS (CAST SUPPLIES)
PADDING CAST ABS COTTON 4X4 ST (CAST SUPPLIES) IMPLANT
PADDING CAST SYNTHETIC 4 (CAST SUPPLIES) ×1
PADDING CAST SYNTHETIC 4X4 STR (CAST SUPPLIES) ×1 IMPLANT
PENCIL SMOKE EVACUATOR (MISCELLANEOUS) ×2 IMPLANT
PLATE GEMINUS DIST RAD LTS 4HL (Plate) ×2 IMPLANT
SKELETAL DYNAMICS DVR SET (Set) ×2 IMPLANT
SLEEVE SCD COMPRESS KNEE MED (MISCELLANEOUS) ×2 IMPLANT
SLING ARM FOAM STRAP LRG (SOFTGOODS) IMPLANT
SLING ARM FOAM STRAP MED (SOFTGOODS) ×2 IMPLANT
SPLINT FIBERGLASS 4X30 (CAST SUPPLIES) ×2 IMPLANT
SPLINT PLASTER CAST XFAST 3X15 (CAST SUPPLIES) IMPLANT
SPLINT PLASTER XTRA FASTSET 3X (CAST SUPPLIES)
STOCKINETTE 6  STRL (DRAPES) ×2
STOCKINETTE 6 STRL (DRAPES) ×1 IMPLANT
SUCTION FRAZIER HANDLE 10FR (MISCELLANEOUS) ×2
SUCTION TUBE FRAZIER 10FR DISP (MISCELLANEOUS) ×1 IMPLANT
SUT VIC AB 2-0 PS2 27 (SUTURE) ×2 IMPLANT
SUT VICRYL 4-0 PS2 18IN ABS (SUTURE) IMPLANT
SUT VICRYL RAPIDE 4-0 (SUTURE) IMPLANT
SUT VICRYL RAPIDE 4/0 PS 2 (SUTURE) ×2 IMPLANT
SYR 10ML LL (SYRINGE) IMPLANT
SYR BULB EAR ULCER 3OZ GRN STR (SYRINGE) ×2 IMPLANT
TOWEL GREEN STERILE FF (TOWEL DISPOSABLE) ×2 IMPLANT
TUBE CONNECTING 20X1/4 (TUBING) ×2 IMPLANT
UNDERPAD 30X36 HEAVY ABSORB (UNDERPADS AND DIAPERS) ×2 IMPLANT
WIRE FIX 1.5 STANDARD TIP (WIRE)
WIRE FIX 1.5 STD TIP (WIRE) IMPLANT
driver square tip 2.0mm ×4 IMPLANT

## 2020-09-18 NOTE — Discharge Instructions (Addendum)
Discharge Instructions   You have a dressing with a plaster splint incorporated in it. Move your fingers as much as possible, making a full fist and fully opening the fist. Elevate your hand to reduce pain & swelling of the digits.  Ice over the operative site may be helpful to reduce pain & swelling. Interestingly, ice applied to the armpit may also help.  DO NOT USE HEAT. Pain medicine has been prescribed for you--ibuprofen, tylenol, and some intermittent oxycodone as a supplement   You may shower, but keep the bandage clean & dry.  You may drive a car when you are off of prescription pain medications and can safely control your vehicle with both hands. Our office will call you to arrange follow-up   Please call [phone] during normal business hours or [phone] after hours for any problems. Including the following:  - excessive redness of the incisions - drainage for more than 4 days - fever of more than 101.5 F  *Please note that pain medications will not be refilled after hours or on weekends.   Post Anesthesia Home Care Instructions  Activity: Get plenty of rest for the remainder of the day. A responsible individual must stay with you for 24 hours following the procedure.  For the next 24 hours, DO NOT: -Drive a car -Paediatric nurse -Drink alcoholic beverages -Take any medication unless instructed by your physician -Make any legal decisions or sign important papers.  Meals: Start with liquid foods such as gelatin or soup. Progress to regular foods as tolerated. Avoid greasy, spicy, heavy foods. If nausea and/or vomiting occur, drink only clear liquids until the nausea and/or vomiting subsides. Call your physician if vomiting continues.  Special Instructions/Symptoms: Your throat may feel dry or sore from the anesthesia or the breathing tube placed in your throat during surgery. If this causes discomfort, gargle with warm salt water. The discomfort should disappear  within 24 hours.  If you had a scopolamine patch placed behind your ear for the management of post- operative nausea and/or vomiting:  1. The medication in the patch is effective for 72 hours, after which it should be removed.  Wrap patch in a tissue and discard in the trash. Wash hands thoroughly with soap and water. 2. You may remove the patch earlier than 72 hours if you experience unpleasant side effects which may include dry mouth, dizziness or visual disturbances. 3. Avoid touching the patch. Wash your hands with soap and water after contact with the patch.    Regional Anesthesia Blocks  1. Numbness or the inability to move the "blocked" extremity may last from 3-48 hours after placement. The length of time depends on the medication injected and your individual response to the medication. If the numbness is not going away after 48 hours, call your surgeon.  2. The extremity that is blocked will need to be protected until the numbness is gone and the  Strength has returned. Because you cannot feel it, you will need to take extra care to avoid injury. Because it may be weak, you may have difficulty moving it or using it. You may not know what position it is in without looking at it while the block is in effect.  3. For blocks in the legs and feet, returning to weight bearing and walking needs to be done carefully. You will need to wait until the numbness is entirely gone and the strength has returned. You should be able to move your leg and foot normally before  you try and bear weight or walk. You will need someone to be with you when you first try to ensure you do not fall and possibly risk injury.  4. Bruising and tenderness at the needle site are common side effects and will resolve in a few days.  5. Persistent numbness or new problems with movement should be communicated to the surgeon or the [facility] [phone]).

## 2020-09-18 NOTE — Op Note (Signed)
09/18/2020  7:22 AM  PATIENT:  [name] [name]  64 y.o. female  PRE-OPERATIVE DIAGNOSIS:  Comminuted displaced intra-articular left distal radius fracture  POST-OPERATIVE DIAGNOSIS:  Same  PROCEDURE:  ORIF comminuted displaced left intra-articular left distal radius fracture (3+ frag)  SURGEON: Rayvon Char. Grandville Silos, MD  PHYSICIAN ASSISTANT: none  ANESTHESIA:  regional and MAC  SPECIMENS:  None  DRAINS: None  EBL:  less than 50 mL  PREOPERATIVE INDICATIONS:  [name] is a  64 y.o. female with a comminuted, displaced left intra-articular distal radius fracture  The risks benefits and alternatives were discussed with the patient preoperatively including but not limited to the risks of infection, bleeding, nerve injury, cardiopulmonary complications, the need for revision surgery, among others, and the patient verbalized understanding and consented to proceed.  OPERATIVE IMPLANTS: [employer] Geminus plate/screws/pegs  OPERATIVE PROCEDURE: After receiving prophylactic antibiotics and a regional block, the patient was escorted to the operative theatre and placed in a supine position.   A surgical "time-out" was performed during which the planned procedure, proposed operative site, and the correct patient identity were compared to the operative consent and agreement confirmed by the circulating nurse according to current facility policy. Following application of a tourniquet to the operative extremity, the exposed skin was pre-scrubbed with Hibiclens scrub brush and then was prepped with Chloraprep and draped in the usual sterile fashion. The limb was exsanguinated with an Esmarch bandage and the tourniquet inflated to approximately 177mHg higher than systolic BP.   A sinusoidal-shaped incision was marked and made over the FCR axis and the distal forearm. The skin was incised sharply with scalpel, subcutaneous tissues with blunt and spreading dissection. The FCR axis was exploited  deeply. The pronator quadratus was reflected in an L-shaped ulnarly and the brachioradialis was split in a Z-plasty fashion for later reapproximation. The fracture was inspected and provisionally reduced.  This was confirmed fluoroscopically. The appropriately sized plate was selected and found to fit well. It was placed in its provisional alignment of the radius and this was confirmed fluoroscopically.  It was secured to the radius with a screw through the slotted hole.  Additional adjustments were made as necessary, and the distal holes were all drilled and filled.  Peg/screw length distally was selected on the shorter side of measurements to minimize the risk for dorsal cortical penetration. The remainder of the proximal holes were drilled and filled.   Final images were obtained and the DRUJ was examined for stability. It was found to be sufficiently stable. The wound was then copiously irrigated and the brachioradialis repaired with 2-0 Vicryl Rapide suture followed by repair of the pronator quadratus with the same suture type. Tourniquet was released and additional hemostasis obtained and the skin was closed with 2-0 Vicryl deep dermal buried sutures followed by running 4-0 Vicryl Rapide horizontal mattress suture in the skin. A bulky dressing with a volar plaster component was applied and the patient was taken to the recovery room in stable condition.  DISPOSITION: The patient will be discharged home today with typical post-op instructions, returning in 10-15 days for reevaluation with new x-rays of the affected wrist out of the splint to include an inclined lateral and then transition to therapy to have a custom splint constructed and begin rehabilitation.

## 2020-09-18 NOTE — Anesthesia Postprocedure Evaluation (Signed)
Anesthesia Post Note  Patient: [name] [name]  Procedure(s) Performed: OPEN TREATMENT OF LEFT DISTAL RADIUS FRACTURE (Left Wrist)     Patient location during evaluation: PACU Anesthesia Type: Regional Level of consciousness: awake and alert Pain management: pain level controlled Vital Signs Assessment: post-procedure vital signs reviewed and stable Respiratory status: spontaneous breathing Cardiovascular status: stable Anesthetic complications: no   No complications documented.  Last Vitals:  Vitals:   09/18/20 0900 09/18/20 0915  BP: (!) 109/47   Pulse: 71   Resp: 12   Temp:    SpO2: 98% 97%    Last Pain:  Vitals:   09/18/20 0915  TempSrc:   PainSc: 0-No pain                 Nolon Nations

## 2020-09-18 NOTE — Transfer of Care (Signed)
Immediate Anesthesia Transfer of Care Note  Patient: [name] [name]  Procedure(s) Performed: OPEN TREATMENT OF LEFT DISTAL RADIUS FRACTURE (Left Wrist)  Patient Location: PACU  Anesthesia Type:MAC combined with regional for post-op pain  Level of Consciousness: awake, alert , oriented and patient cooperative  Airway & Oxygen Therapy: Patient Spontanous Breathing and Patient connected to nasal cannula oxygen  Post-op Assessment: Report given to RN and Post -op Vital signs reviewed and stable  Post vital signs: Reviewed and stable  Last Vitals:  Vitals Value Taken Time  BP    Temp    Pulse    Resp    SpO2      Last Pain:  Vitals:   09/18/20 0725  TempSrc:   PainSc: 1       Patients Stated Pain Goal: 3 (10/01/50 0258)  Complications: No complications documented.

## 2020-09-18 NOTE — Interval H&P Note (Signed)
History and Physical Interval Note:  09/18/2020 6:31 AM  [name] [name]  has presented today for surgery, with the diagnosis of LEFT DISTAL RADIUS FRACTURE.  The various methods of treatment have been discussed with the patient and family. After consideration of risks, benefits and other options for treatment, the patient has consented to  Procedure(s) with comments: OPEN TREATMENT OF LEFT DISTAL RADIUS FRACTURE (Left) - LENGTH OF SURGERY: 75 MIN  PRE OP BLOCK as a surgical intervention.  The patient's history has been reviewed, patient examined, no change in status, stable for surgery.  I have reviewed the patient's chart and labs.  Questions were answered to the patient's satisfaction.     Jolyn Nap

## 2020-09-18 NOTE — Anesthesia Preprocedure Evaluation (Addendum)
Anesthesia Evaluation  Patient identified by MRN, date of birth, ID band Patient awake    Reviewed: Allergy & Precautions, NPO status , Patient's Chart, lab work & pertinent test results  History of Anesthesia Complications (+) PONV and history of anesthetic complications  Airway Mallampati: II  TM Distance: >3 FB Neck ROM: Full    Dental  (+) Dental Advisory Given, Teeth Intact   Pulmonary neg pulmonary ROS, Current Smoker and Patient abstained from smoking.,    Pulmonary exam normal breath sounds clear to auscultation       Cardiovascular hypertension, Normal cardiovascular exam Rhythm:Regular Rate:Normal     Neuro/Psych PSYCHIATRIC DISORDERS Anxiety    GI/Hepatic negative GI ROS, Neg liver ROS,   Endo/Other  negative endocrine ROS  Renal/GU negative Renal ROS     Musculoskeletal  (+) Arthritis , Fibromyalgia -  Abdominal   Peds  Hematology negative hematology ROS (+)   Anesthesia Other Findings   Reproductive/Obstetrics                            Anesthesia Physical Anesthesia Plan  ASA: II  Anesthesia Plan: Regional   Post-op Pain Management:    Induction: Intravenous  PONV Risk Score and Plan: 2 and Propofol infusion, TIVA, Midazolam, Treatment may vary due to age or medical condition and Ondansetron  Airway Management Planned: Natural Airway  Additional Equipment: None  Intra-op Plan:   Post-operative Plan:   Informed Consent: I have reviewed the patients History and Physical, chart, labs and discussed the procedure including the risks, benefits and alternatives for the proposed anesthesia with the patient or authorized representative who has indicated his/her understanding and acceptance.     Dental advisory given  Plan Discussed with: CRNA  Anesthesia Plan Comments:        Anesthesia Quick Evaluation

## 2020-09-18 NOTE — Progress Notes (Signed)
Assisted Dr. Germeroth with left, ultrasound guided, axillary block. Side rails up, monitors on throughout procedure. See vital signs in flow sheet. Tolerated Procedure well. 

## 2020-09-18 NOTE — Anesthesia Procedure Notes (Signed)
Anesthesia Regional Block: Interscalene brachial plexus block   Pre-Anesthetic Checklist: ,, timeout performed, Correct Patient, Correct Site, Correct Laterality, Correct Procedure, Correct Position, site marked, Risks and benefits discussed,  Surgical consent,  Pre-op evaluation,  At surgeon's request and post-op pain management  Laterality: Left  Prep: chloraprep       Needles:  Injection technique: Single-shot  Needle Type: Stimulator Needle - 40     Needle Length: 4cm  Needle Gauge: 22     Additional Needles:   Procedures:,,,, ultrasound used (permanent image in chart),,,,  Narrative:  Start time: 09/18/2020 7:19 AM End time: 09/18/2020 7:24 AM Injection made incrementally with aspirations every 5 mL.  Performed by: Personally  Anesthesiologist: Nolon Nations, MD  Additional Notes: BP cuff, EKG monitors applied. Sedation begun. Nerve location verified with U/S. Anesthetic injected incrementally, slowly , and after neg aspirations under direct u/s guidance. Good perineural spread. Tolerated well.

## 2020-09-19 NOTE — Addendum Note (Signed)
Addendum  created 09/19/20 0930 by Maryella Shivers, CRNA   Charge Capture section accepted

## 2020-09-25 ENCOUNTER — Encounter (HOSPITAL_BASED_OUTPATIENT_CLINIC_OR_DEPARTMENT_OTHER): Payer: Self-pay | Admitting: Orthopedic Surgery

## 2020-09-28 DIAGNOSIS — M25632 Stiffness of left wrist, not elsewhere classified: Secondary | ICD-10-CM | POA: Diagnosis not present

## 2020-09-28 DIAGNOSIS — S52572D Other intraarticular fracture of lower end of left radius, subsequent encounter for closed fracture with routine healing: Secondary | ICD-10-CM | POA: Diagnosis not present

## 2020-09-28 DIAGNOSIS — S52502D Unspecified fracture of the lower end of left radius, subsequent encounter for closed fracture with routine healing: Secondary | ICD-10-CM | POA: Diagnosis not present

## 2020-10-05 DIAGNOSIS — S52502D Unspecified fracture of the lower end of left radius, subsequent encounter for closed fracture with routine healing: Secondary | ICD-10-CM | POA: Diagnosis not present

## 2020-10-05 DIAGNOSIS — M25632 Stiffness of left wrist, not elsewhere classified: Secondary | ICD-10-CM | POA: Diagnosis not present

## 2020-10-10 DIAGNOSIS — M25632 Stiffness of left wrist, not elsewhere classified: Secondary | ICD-10-CM | POA: Diagnosis not present

## 2020-10-10 DIAGNOSIS — S52502D Unspecified fracture of the lower end of left radius, subsequent encounter for closed fracture with routine healing: Secondary | ICD-10-CM | POA: Diagnosis not present

## 2020-10-12 DIAGNOSIS — S52502D Unspecified fracture of the lower end of left radius, subsequent encounter for closed fracture with routine healing: Secondary | ICD-10-CM | POA: Diagnosis not present

## 2020-10-12 DIAGNOSIS — M25632 Stiffness of left wrist, not elsewhere classified: Secondary | ICD-10-CM | POA: Diagnosis not present

## 2020-10-18 DIAGNOSIS — M25632 Stiffness of left wrist, not elsewhere classified: Secondary | ICD-10-CM | POA: Diagnosis not present

## 2020-10-18 DIAGNOSIS — S52502D Unspecified fracture of the lower end of left radius, subsequent encounter for closed fracture with routine healing: Secondary | ICD-10-CM | POA: Diagnosis not present

## 2020-10-23 DIAGNOSIS — S52502D Unspecified fracture of the lower end of left radius, subsequent encounter for closed fracture with routine healing: Secondary | ICD-10-CM | POA: Diagnosis not present

## 2020-10-23 DIAGNOSIS — M25632 Stiffness of left wrist, not elsewhere classified: Secondary | ICD-10-CM | POA: Diagnosis not present

## 2020-10-31 DIAGNOSIS — S52502D Unspecified fracture of the lower end of left radius, subsequent encounter for closed fracture with routine healing: Secondary | ICD-10-CM | POA: Diagnosis not present

## 2020-10-31 DIAGNOSIS — M25632 Stiffness of left wrist, not elsewhere classified: Secondary | ICD-10-CM | POA: Diagnosis not present

## 2020-10-31 DIAGNOSIS — S52572D Other intraarticular fracture of lower end of left radius, subsequent encounter for closed fracture with routine healing: Secondary | ICD-10-CM | POA: Diagnosis not present

## 2020-11-01 DIAGNOSIS — M25632 Stiffness of left wrist, not elsewhere classified: Secondary | ICD-10-CM | POA: Diagnosis not present

## 2020-11-01 DIAGNOSIS — S52502D Unspecified fracture of the lower end of left radius, subsequent encounter for closed fracture with routine healing: Secondary | ICD-10-CM | POA: Diagnosis not present

## 2020-11-07 DIAGNOSIS — M25632 Stiffness of left wrist, not elsewhere classified: Secondary | ICD-10-CM | POA: Diagnosis not present

## 2020-11-07 DIAGNOSIS — S52502D Unspecified fracture of the lower end of left radius, subsequent encounter for closed fracture with routine healing: Secondary | ICD-10-CM | POA: Diagnosis not present

## 2020-11-16 DIAGNOSIS — M25632 Stiffness of left wrist, not elsewhere classified: Secondary | ICD-10-CM | POA: Diagnosis not present

## 2020-11-16 DIAGNOSIS — S52502D Unspecified fracture of the lower end of left radius, subsequent encounter for closed fracture with routine healing: Secondary | ICD-10-CM | POA: Diagnosis not present

## 2020-11-20 DIAGNOSIS — S52502D Unspecified fracture of the lower end of left radius, subsequent encounter for closed fracture with routine healing: Secondary | ICD-10-CM | POA: Diagnosis not present

## 2020-11-20 DIAGNOSIS — M25632 Stiffness of left wrist, not elsewhere classified: Secondary | ICD-10-CM | POA: Diagnosis not present

## 2020-11-24 DIAGNOSIS — M25632 Stiffness of left wrist, not elsewhere classified: Secondary | ICD-10-CM | POA: Diagnosis not present

## 2020-11-24 DIAGNOSIS — S52502D Unspecified fracture of the lower end of left radius, subsequent encounter for closed fracture with routine healing: Secondary | ICD-10-CM | POA: Diagnosis not present

## 2020-11-27 DIAGNOSIS — M25632 Stiffness of left wrist, not elsewhere classified: Secondary | ICD-10-CM | POA: Diagnosis not present

## 2020-11-27 DIAGNOSIS — S52502D Unspecified fracture of the lower end of left radius, subsequent encounter for closed fracture with routine healing: Secondary | ICD-10-CM | POA: Diagnosis not present

## 2020-11-30 DIAGNOSIS — S52502D Unspecified fracture of the lower end of left radius, subsequent encounter for closed fracture with routine healing: Secondary | ICD-10-CM | POA: Diagnosis not present

## 2020-11-30 DIAGNOSIS — Z9889 Other specified postprocedural states: Secondary | ICD-10-CM | POA: Diagnosis not present

## 2020-11-30 DIAGNOSIS — S52572D Other intraarticular fracture of lower end of left radius, subsequent encounter for closed fracture with routine healing: Secondary | ICD-10-CM | POA: Diagnosis not present

## 2020-11-30 DIAGNOSIS — M25631 Stiffness of right wrist, not elsewhere classified: Secondary | ICD-10-CM | POA: Diagnosis not present

## 2020-11-30 DIAGNOSIS — M25632 Stiffness of left wrist, not elsewhere classified: Secondary | ICD-10-CM | POA: Diagnosis not present

## 2020-12-05 DIAGNOSIS — M25632 Stiffness of left wrist, not elsewhere classified: Secondary | ICD-10-CM | POA: Diagnosis not present

## 2020-12-05 DIAGNOSIS — S52502D Unspecified fracture of the lower end of left radius, subsequent encounter for closed fracture with routine healing: Secondary | ICD-10-CM | POA: Diagnosis not present

## 2020-12-07 ENCOUNTER — Other Ambulatory Visit: Payer: Self-pay | Admitting: Obstetrics and Gynecology

## 2020-12-07 DIAGNOSIS — N644 Mastodynia: Secondary | ICD-10-CM

## 2020-12-07 DIAGNOSIS — N6459 Other signs and symptoms in breast: Secondary | ICD-10-CM | POA: Diagnosis not present

## 2020-12-12 DIAGNOSIS — M25632 Stiffness of left wrist, not elsewhere classified: Secondary | ICD-10-CM | POA: Diagnosis not present

## 2020-12-12 DIAGNOSIS — S52502D Unspecified fracture of the lower end of left radius, subsequent encounter for closed fracture with routine healing: Secondary | ICD-10-CM | POA: Diagnosis not present

## 2020-12-14 ENCOUNTER — Other Ambulatory Visit: Payer: Self-pay

## 2020-12-14 ENCOUNTER — Ambulatory Visit
Admission: RE | Admit: 2020-12-14 | Discharge: 2020-12-14 | Disposition: A | Discharge status: Home / Self Care | Payer: BC Managed Care – PPO | Source: Ambulatory Visit | Attending: Obstetrics and Gynecology | Admitting: Obstetrics and Gynecology

## 2020-12-14 DIAGNOSIS — R922 Inconclusive mammogram: Secondary | ICD-10-CM | POA: Diagnosis not present

## 2020-12-14 DIAGNOSIS — N644 Mastodynia: Secondary | ICD-10-CM | POA: Diagnosis not present

## 2020-12-20 DIAGNOSIS — S52502D Unspecified fracture of the lower end of left radius, subsequent encounter for closed fracture with routine healing: Secondary | ICD-10-CM | POA: Diagnosis not present

## 2020-12-20 DIAGNOSIS — M25632 Stiffness of left wrist, not elsewhere classified: Secondary | ICD-10-CM | POA: Diagnosis not present

## 2020-12-27 DIAGNOSIS — M25632 Stiffness of left wrist, not elsewhere classified: Secondary | ICD-10-CM | POA: Diagnosis not present

## 2020-12-27 DIAGNOSIS — S52502D Unspecified fracture of the lower end of left radius, subsequent encounter for closed fracture with routine healing: Secondary | ICD-10-CM | POA: Diagnosis not present

## 2021-01-04 DIAGNOSIS — M25632 Stiffness of left wrist, not elsewhere classified: Secondary | ICD-10-CM | POA: Diagnosis not present

## 2021-01-04 DIAGNOSIS — S52502D Unspecified fracture of the lower end of left radius, subsequent encounter for closed fracture with routine healing: Secondary | ICD-10-CM | POA: Diagnosis not present

## 2021-01-11 DIAGNOSIS — S52572D Other intraarticular fracture of lower end of left radius, subsequent encounter for closed fracture with routine healing: Secondary | ICD-10-CM | POA: Diagnosis not present

## 2021-01-11 DIAGNOSIS — S52502D Unspecified fracture of the lower end of left radius, subsequent encounter for closed fracture with routine healing: Secondary | ICD-10-CM | POA: Diagnosis not present

## 2021-01-11 DIAGNOSIS — M25632 Stiffness of left wrist, not elsewhere classified: Secondary | ICD-10-CM | POA: Diagnosis not present

## 2021-01-24 DIAGNOSIS — S52502D Unspecified fracture of the lower end of left radius, subsequent encounter for closed fracture with routine healing: Secondary | ICD-10-CM | POA: Diagnosis not present

## 2021-01-24 DIAGNOSIS — M25632 Stiffness of left wrist, not elsewhere classified: Secondary | ICD-10-CM | POA: Diagnosis not present

## 2021-01-31 ENCOUNTER — Ambulatory Visit (INDEPENDENT_AMBULATORY_CARE_PROVIDER_SITE_OTHER): Payer: BC Managed Care – PPO | Admitting: Family Medicine

## 2021-01-31 ENCOUNTER — Encounter: Payer: Self-pay | Admitting: Family Medicine

## 2021-01-31 ENCOUNTER — Other Ambulatory Visit: Payer: Self-pay

## 2021-01-31 VITALS — BP 160/86 | HR 82 | Temp 97.8°F | Ht 68.0 in | Wt 173.6 lb

## 2021-01-31 DIAGNOSIS — Z2821 Immunization not carried out because of patient refusal: Secondary | ICD-10-CM

## 2021-01-31 DIAGNOSIS — E785 Hyperlipidemia, unspecified: Secondary | ICD-10-CM | POA: Diagnosis not present

## 2021-01-31 DIAGNOSIS — Z8781 Personal history of (healed) traumatic fracture: Secondary | ICD-10-CM | POA: Diagnosis not present

## 2021-01-31 DIAGNOSIS — R7301 Impaired fasting glucose: Secondary | ICD-10-CM | POA: Diagnosis not present

## 2021-01-31 LAB — LIPID PANEL
Cholesterol: 237 mg/dL — ABNORMAL HIGH (ref 0–200)
HDL: 37.6 mg/dL — ABNORMAL LOW (ref >39.00)
NonHDL: 198.99
Total CHOL/HDL Ratio: 6
Triglycerides: 202 mg/dL — ABNORMAL HIGH (ref 0.0–149.0)
VLDL: 40.4 mg/dL — ABNORMAL HIGH (ref 0.0–40.0)

## 2021-01-31 LAB — HEMOGLOBIN A1C: Hgb A1c MFr Bld: 5.8 % (ref 4.6–6.5)

## 2021-01-31 LAB — LDL CHOLESTEROL, DIRECT: Direct LDL: 177 mg/dL

## 2021-01-31 NOTE — Addendum Note (Signed)
Addended by: Lynnea Ferrier on: 01/31/2021 10:20 AM   Modules accepted: Orders

## 2021-01-31 NOTE — Progress Notes (Signed)
[name] [name] is a 65 y.o. female  Chief Complaint  Patient presents with  . Establish Care    TOC- establish care and discuss fall on 09/11/20 and had surgery on 09/18/20.  Wants blood work done as well.    Declines flu shot.    HPI: [name] [name] is a 65 y.o. female  Pt fell in late 08/2020 and sustained a distal radial fracture of her Lt wrist. This was surgically repaired on 09/18/21 by Dr. Milly Jakob. Pt is still in PT and still having pain. She states PT has told her that she "has come a long way". Pt notes generalized stiffness. She tried meloxicam 15mg  daily but she states it made her tired and she stopped taking it.  Last Dexa in 06/2019, ordered byt GYN Dr. Matthew Saras. T-score = -2.7.  She takes calcium and Vit D supplements daily.  She declines flu vaccine today.  She is due for f/u labs - FLP and A1C. She has coffee with cream.   Past Medical History:  Diagnosis Date  . Anxiety   . Arthritis    HANDS,  KNEES  . Bone tumor (benign)    RIGHT FEMUR  . Borderline hypertension   . Female pelvic pain   . Fibromyalgia   . Left knee injury    TENDONDINITIS/  CHONDRAMALIA  . PONV (postoperative nausea and vomiting)     Past Surgical History:  Procedure Laterality Date  . DILATION AND CURETTAGE OF UTERUS  1986  &  1988   RETAINED PLACENTA  . 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 Loyal Gambler  . LAPAROSCOPIC ASSISTED VAGINAL HYSTERECTOMY  01-25-2003  . LAPAROSCOPY N/A 04/18/2014   Procedure: LAPAROSCOPY DIAGNOSTIC;  Surgeon: Margarette Asal, MD;  Location: [facility];  Service: Gynecology;  Laterality: N/A;  . NASAL SEPTUM SURGERY  AGE 86  . OPEN REDUCTION INTERNAL FIXATION (ORIF) DISTAL RADIAL FRACTURE Left 09/18/2020   Procedure: OPEN TREATMENT OF LEFT DISTAL RADIUS FRACTURE;  Surgeon: Milly Jakob, MD;  Location: [facility];  Service: Orthopedics;  Laterality: Left;  LENGTH OF SURGERY: 75 MIN  PRE OP BLOCK  .  SALPINGOOPHORECTOMY Bilateral 04/18/2014   Procedure: SALPINGO OOPHORECTOMY;  Surgeon: Margarette Asal, MD;  Location: [facility];  Service: Gynecology;  Laterality: Bilateral;  . TONSILLECTOMY  AGE 39  . TUBAL LIGATION      Social History   Socioeconomic History  . Marital status: Married    Spouse name: Not on file  . Number of children: Not on file  . Years of education: Not on file  . Highest education level: Not on file  Occupational History  . Not on file  Tobacco Use  . Smoking status: Current Every Day Smoker    Packs/day: 0.50    Years: 42.00    Pack years: 21.00    Types: Cigarettes    Start date: 3  . Smokeless tobacco: Never Used  Substance and Sexual Activity  . Alcohol use: Yes    Comment: rare  . Drug use: No  . Sexual activity: Not on file  Other Topics Concern  . Not on file  Social History Narrative  . Not on file   Social Determinants of Health   Financial Resource Strain: Not on file  Food Insecurity: Not on file  Transportation Needs: Not on file  Physical Activity: Not on file  Stress: Not on file  Social Connections:  Not on file  Intimate Partner Violence: Not on file    Family History  Problem Relation Age of Onset  . Colon cancer Neg Hx   . Stomach cancer Neg Hx      Immunization History  Administered Date(s) Administered  . Influenza,inj,Quad PF,6+ Mos 11/19/2017, 10/01/2018  . Moderna Sars-Covid-2 Vaccination 07/17/2020, 08/24/2020  . Tdap 11/19/2017    Outpatient Encounter Medications as of 01/31/2021  Medication Sig  . ALPRAZolam (XANAX) 0.25 MG tablet Take 1 tablet (0.25 mg total) by mouth daily as needed.  . calcium carbonate (OS-CAL) 600 MG TABS Take 1 tablet by mouth 2 (two) times daily. Bone-Up Supp.  . Cholecalciferol (VITAMIN D3) 2000 UNITS TABS Take 1 capsule by mouth daily.  . fluticasone (FLONASE) 50 MCG/ACT nasal spray Place 2 sprays into both nostrils daily.  . Magnesium 250 MG TABS Take 1  tablet by mouth daily.  . meloxicam (MOBIC) 15 MG tablet Take 15 mg by mouth daily.  . Omega-3 Fatty Acids (OMEGA 3 PO) Take by mouth.  . vitamin E 400 UNIT capsule Take 400 Units by mouth daily.   . [DISCONTINUED] oxyCODONE (ROXICODONE) 5 MG immediate release tablet Take 1 tablet (5 mg total) by mouth every 6 (six) hours as needed for severe pain. (Patient not taking: Reported on 01/31/2021)   No facility-administered encounter medications on file as of 01/31/2021.     ROS: Pertinent positives and negatives noted in HPI. Remainder of ROS non-contributory    Allergies  Allergen Reactions  . Hydrocodone Rash  . Demerol [Meperidine] Nausea And Vomiting    severe  . Molds & Smuts   . Sulfa Antibiotics Swelling and Rash    BP (!) 160/86   Pulse 82   Temp 97.8 F (36.6 C) (Temporal)   Ht 5\' 8"  (1.727 m)   Wt 173 lb 9.6 oz (78.7 kg)   SpO2 97%   BMI 26.40 kg/m   Wt Readings from Last 3 Encounters:  01/31/21 173 lb 9.6 oz (78.7 kg)  09/18/20 174 lb 2.6 oz (79 kg)  09/12/20 173 lb 9.6 oz (78.7 kg)   Temp Readings from Last 3 Encounters:  01/31/21 97.8 F (36.6 C) (Temporal)  09/18/20 97.7 F (36.5 C)  09/12/20 97.7 F (36.5 C) (Oral)   BP Readings from Last 3 Encounters:  01/31/21 (!) 160/86  09/18/20 116/65  09/12/20 (!) 152/79   Pulse Readings from Last 3 Encounters:  01/31/21 82  09/18/20 70  09/12/20 80     Physical Exam Constitutional:      General: She is not in acute distress.    Appearance: Normal appearance. She is not ill-appearing.  Musculoskeletal:     Left wrist: Swelling and tenderness present. No deformity, lacerations or crepitus.  Neurological:     Mental Status: She is alert and oriented to person, place, and time.  Psychiatric:        Mood and Affect: Mood normal.        Behavior: Behavior normal.      A/P:  1. Influenza vaccination declined by patient  2. History of fracture of wrist - distal radial fracture s/p surgical repair in  09/18/20 - still doing PT - restart meloxicam 7.5mg  daily or 15mg  daily - f/u with ortho as scheduled  3. IFG (impaired fasting glucose) - A1C today  4. Hyperlipidemia, unspecified hyperlipidemia type - FLP today  This visit occurred during the SARS-CoV-2 public health emergency.  Safety protocols were in place, including screening questions prior  to the visit, additional usage of staff PPE, and extensive cleaning of exam room while observing appropriate contact time as indicated for disinfecting solutions.

## 2021-02-02 ENCOUNTER — Other Ambulatory Visit: Payer: Self-pay | Admitting: Family Medicine

## 2021-02-02 DIAGNOSIS — E785 Hyperlipidemia, unspecified: Secondary | ICD-10-CM

## 2021-02-02 MED ORDER — EZETIMIBE 10 MG PO TABS: 10.0000 mg | ORAL_TABLET | Freq: Every day | ORAL | 3 refills | Status: DC

## 2021-02-08 DIAGNOSIS — S52572D Other intraarticular fracture of lower end of left radius, subsequent encounter for closed fracture with routine healing: Secondary | ICD-10-CM | POA: Diagnosis not present

## 2021-02-09 ENCOUNTER — Telehealth: Payer: Self-pay | Admitting: Family Medicine

## 2021-02-09 NOTE — Telephone Encounter (Signed)
Pt called and requesting call back from [facility]

## 2021-02-09 NOTE — Telephone Encounter (Signed)
FYI: Spoke to patient and she states that she picked up the Zetia from the pharmacy and hasn't started it yet.  She wants to try to do just diet change and re-check cholesterol in 3 months.  She also wanted to let you know that she got an injection in her arm yesterday.  Dm/cma

## 2021-02-22 DIAGNOSIS — S52502D Unspecified fracture of the lower end of left radius, subsequent encounter for closed fracture with routine healing: Secondary | ICD-10-CM | POA: Diagnosis not present

## 2021-02-22 DIAGNOSIS — M25632 Stiffness of left wrist, not elsewhere classified: Secondary | ICD-10-CM | POA: Diagnosis not present

## 2021-03-20 DIAGNOSIS — S52572D Other intraarticular fracture of lower end of left radius, subsequent encounter for closed fracture with routine healing: Secondary | ICD-10-CM | POA: Diagnosis not present

## 2021-04-24 ENCOUNTER — Ambulatory Visit (INDEPENDENT_AMBULATORY_CARE_PROVIDER_SITE_OTHER): Payer: BC Managed Care – PPO | Admitting: Family Medicine

## 2021-04-24 ENCOUNTER — Encounter: Payer: Self-pay | Admitting: Family Medicine

## 2021-04-24 ENCOUNTER — Other Ambulatory Visit: Payer: Self-pay

## 2021-04-24 VITALS — BP 136/76 | HR 75 | Temp 98.0°F | Wt 169.0 lb

## 2021-04-24 DIAGNOSIS — M419 Scoliosis, unspecified: Secondary | ICD-10-CM

## 2021-04-24 DIAGNOSIS — M479 Spondylosis, unspecified: Secondary | ICD-10-CM

## 2021-04-24 DIAGNOSIS — J301 Allergic rhinitis due to pollen: Secondary | ICD-10-CM

## 2021-04-24 DIAGNOSIS — M8580 Other specified disorders of bone density and structure, unspecified site: Secondary | ICD-10-CM | POA: Insufficient documentation

## 2021-04-24 DIAGNOSIS — F419 Anxiety disorder, unspecified: Secondary | ICD-10-CM | POA: Insufficient documentation

## 2021-04-24 HISTORY — DX: Spondylosis, unspecified: M47.9

## 2021-04-24 HISTORY — DX: Scoliosis, unspecified: M41.9

## 2021-04-24 NOTE — Progress Notes (Signed)
[facility] [address] [place] [address] Dept: [phone] Dept Fax: [phone]  Office Visit  Subjective:    Patient ID: [name] [name], female    DOB: [birth year], 65 y.o..   MRN: [id number]  Chief Complaint  Patient presents with  . Acute Visit    C/o having HA, LT ear pain, feeling off balance, sinus drainage/pressure and post nasal drip  3 days.  She hasn't taken any OTC meds.  No covid test done.      History of Present Illness:  Patient is in today with a recent issue with increased eye irritation, nasal congestion, sinus pressure, headache and left ear pain over the past 3 days. She states she did do some cleaning of a back porch last week, which was covered in pollen. Soon after, her symptoms seemed to flare. She noted at home, she had episodes of high blood pressure with systolics in the 696E. She noted some associated "squiggly" floaters in her vision. She also had some occasional episodes of dizziness. She did not take any particular medication, as she was worried about how these might effect her blood pressure. She has Flonase, but only uses this episodically.  Past Medical History: Patient Active Problem List   Diagnosis Date Noted  . Scoliosis deformity of spine 04/24/2021  . Osteopenia 04/24/2021  . Degeneration of spine 04/24/2021  . Anxiety 04/24/2021  . Acute bacterial rhinosinusitis 09/15/2019  . Osteoporosis 09/02/2019  . Tenderness of right calf 05/31/2019  . Calf swelling 05/31/2019  . HLD (hyperlipidemia) 02/22/2016  . Adjustment disorder with mixed anxiety and depressed mood 07/04/2015  . Allergic rhinitis 07/04/2015  . HTN (hypertension) 03/24/2015  . Tobacco abuse 09/01/2012  . Fibromyalgia   . 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 DILATION AND CURETTAGE OF UTERUS  1986  &  1988   RETAINED PLACENTA  . jaw tumor Right 2020    right lower jaw tumor removed 2020 by Dr. Loyal Gambler  . LAPAROSCOPIC ASSISTED VAGINAL HYSTERECTOMY  01-25-2003  . LAPAROSCOPY N/A 04/18/2014   Procedure: LAPAROSCOPY DIAGNOSTIC;  Surgeon: Margarette Asal, MD;  Location: [facility];  Service: Gynecology;  Laterality: N/A;  . NASAL SEPTUM SURGERY  AGE 68  . OPEN REDUCTION INTERNAL FIXATION (ORIF) DISTAL RADIAL FRACTURE Left 09/18/2020   Procedure: OPEN TREATMENT OF LEFT DISTAL RADIUS FRACTURE;  Surgeon: Milly Jakob, MD;  Location: [facility];  Service: Orthopedics;  Laterality: Left;  LENGTH OF SURGERY: 75 MIN  PRE OP BLOCK  . SALPINGOOPHORECTOMY Bilateral 04/18/2014   Procedure: SALPINGO OOPHORECTOMY;  Surgeon: Margarette Asal, MD;  Location: [facility];  Service: Gynecology;  Laterality: Bilateral;  . TONSILLECTOMY  AGE 60  . TUBAL LIGATION     Family History  Problem Relation Age of Onset  . Colon cancer Neg Hx   . Stomach cancer Neg Hx    Outpatient Medications Prior to Visit  Medication Sig Dispense Refill  . ALPRAZolam (XANAX) 0.25 MG tablet Take 1 tablet (0.25 mg total) by mouth daily as needed. 30 tablet 2  . Cholecalciferol (VITAMIN D3) 2000 UNITS TABS Take 1 capsule by mouth daily.    . fluticasone (FLONASE) 50 MCG/ACT nasal spray Place 2 sprays into both nostrils daily. 16 g 6  . Magnesium 250 MG TABS Take 1 tablet by mouth daily.    . meloxicam (MOBIC) 15 MG tablet Take 15 mg by  mouth daily.    . Omega-3 Fatty Acids (OMEGA 3 PO) Take by mouth.    . vitamin E 400 UNIT capsule Take 400 Units by mouth daily.     . calcium carbonate (OS-CAL) 600 MG TABS Take 1 tablet by mouth 2 (two) times daily. Bone-Up Supp. (Patient not taking: Reported on 04/24/2021)    . ezetimibe (ZETIA) 10 MG tablet Take 1 tablet (10 mg total) by mouth daily. (Patient not taking: Reported on 04/24/2021) 90 tablet 3   No facility-administered medications prior to visit.   Allergies  Allergen Reactions  .  Hydrocodone Rash  . Demerol [Meperidine] Nausea And Vomiting    severe  . Molds & Smuts   . Sulfa Antibiotics Swelling and Rash    Objective:   Today's Vitals   04/24/21 1613  BP: 136/76  Pulse: 75  Temp: 98 F (36.7 C)  TempSrc: Temporal  SpO2: 98%  Weight: 169 lb (76.7 kg)   Body mass index is 25.7 kg/m.   General: Well developed, well nourished. No acute distress. HEENT: Normocephalic, non-traumatic. PERRL, EOMI. Conjunctiva clear. External ears normal. EAC and TMs    normal bilaterally. Nasal mucosa is red, swollen, and has a pebbled appearance. There is scant green drainage.  Mucous membranes moist. Oropharynx clear. Good dentition. Neck: Supple. No lymphadenopathy. No thyromegaly. Lungs: Clear to auscultation bilaterally. No wheezing, rales or rhonchi. Psych: Alert and oriented. Normal mood and affect.  Health Maintenance Due  Topic Date Due  . HIV Screening  Never done  . MAMMOGRAM  04/24/2019  . COVID-19 Vaccine (3 - Booster for Moderna series) 02/21/2021     Assessment & Plan:   1. Seasonal allergic rhinitis due to pollen [name] [name]'s symptoms seem most consistent with an acute flare of her seasonal allergies. I recommended she start using her Flonase nasal spray 2 sprays each nostril daily for 2 weeks and then taper to 1 spray each nostril daily to finish ou a month. I recommended she add a daily antihistamine, such as Zyretc. If her symptoms are not resolving by late next week, she may need to reach back out concerning possible sinus infection.  Haydee Salter, MD

## 2021-04-24 NOTE — Patient Instructions (Signed)
Allergic Rhinitis, Adult  Allergic rhinitis is an allergic reaction that affects the mucous membrane inside the nose. The mucous membrane is the tissue that produces mucus. There are two types of allergic rhinitis:  Seasonal. This type is also called hay fever and happens only during certain seasons.  Perennial. This type can happen at any time of the year. Allergic rhinitis cannot be spread from person to person. This condition can be mild, moderate, or severe. It can develop at any age and may be outgrown. What are the causes? This condition is caused by allergens. These are things that can cause an allergic reaction. Allergens may differ for seasonal allergic rhinitis and perennial allergic rhinitis.  Seasonal allergic rhinitis is triggered by pollen. Pollen can come from grasses, trees, and weeds.  Perennial allergic rhinitis may be triggered by: ? Dust mites. ? Proteins in a pet's urine, saliva, or dander. Dander is dead skin cells from a pet. ? Smoke, mold, or car fumes. What increases the risk? You are more likely to develop this condition if you have a family history of allergies or other conditions related to allergies, including:  Allergic conjunctivitis. This is inflammation of parts of the eyes and eyelids.  Asthma. This condition affects the lungs and makes it hard to breathe.  Atopic dermatitis or eczema. This is long term (chronic) inflammation of the skin.  Food allergies. What are the signs or symptoms? Symptoms of this condition include:  Sneezing or coughing.  A stuffy nose (nasal congestion), itchy nose, or nasal discharge.  Itchy eyes and tearing of the eyes.  A feeling of mucus dripping down the back of your throat (postnasal drip).  Trouble sleeping.  Tiredness or fatigue.  Headache.  Sore throat. How is this diagnosed? This condition may be diagnosed with your symptoms, medical history, and physical exam. Your health care provider may check for  related conditions, such as:  Asthma.  Pink eye. This is eye inflammation caused by infection (conjunctivitis).  Ear infection.  Upper respiratory infection. This is an infection in the nose, throat, or upper airways. You may also have tests to find out which allergens trigger your symptoms. These may include skin tests or blood tests. How is this treated? There is no cure for this condition, but treatment can help control symptoms. Treatment may include:  Taking medicines that block allergy symptoms, such as corticosteroids and antihistamines. Medicine may be given as a shot, nasal spray, or pill.  Avoiding any allergens.  Being exposed again and again to tiny amounts of allergens to help you build a defense against allergens (immunotherapy). This is done if other treatments have not helped. It may include: ? Allergy shots. These are injected medicines that have small amounts of allergen in them. ? Sublingual immunotherapy. This involves taking small doses of a medicine with allergen in it under your tongue. If these treatments do not work, your health care provider may prescribe newer, stronger medicines. Follow these instructions at home: Avoiding allergens Find out what you are allergic to and avoid those allergens. These are some things you can do to help avoid allergens:  If you have perennial allergies: ? Replace carpet with wood, tile, or vinyl flooring. Carpet can trap dander and dust. ? Do not smoke. Do not allow smoking in your home. ? Change your heating and air conditioning filters at least once a month.  If you have seasonal allergies, take these steps during allergy season: ? Keep windows closed as much as possible. ?   Plan outdoor activities when pollen counts are lowest. Check pollen counts before you plan outdoor activities. ? When coming indoors, change clothing and shower before sitting on furniture or bedding.  If you have a pet in the house that produces  allergens: ? Keep the pet out of the bedroom. ? Vacuum, sweep, and dust regularly. General instructions  Take over-the-counter and prescription medicines only as told by your health care provider.  Drink enough fluid to keep your urine pale yellow.  Keep all follow-up visits as told by your health care provider. This is important. Where to find more information  American Academy of Allergy, Asthma & Immunology: www.aaaai.org Contact a health care provider if:  You have a fever.  You develop a cough that does not go away.  You make whistling sounds when you breathe (wheeze).  Your symptoms slow you down or stop you from doing your normal activities each day. Get help right away if:  You have shortness of breath. This symptom may represent a serious problem that is an emergency. Do not wait to see if the symptom will go away. Get medical help right away. Call your local emergency services (911 in the [place] Do not drive yourself to the hospital. Summary  Allergic rhinitis may be managed by taking medicines as directed and avoiding allergens.  If you have seasonal allergies, keep windows closed as much as possible during allergy season.  Contact your health care provider if you develop a fever or a cough that does not go away. This information is not intended to replace advice given to you by your health care provider. Make sure you discuss any questions you have with your health care provider. Document Revised: 01/21/2020 Document Reviewed: 11/30/2019 Elsevier Patient Education  2021 Elsevier Inc.  

## 2021-04-25 DIAGNOSIS — L814 Other melanin hyperpigmentation: Secondary | ICD-10-CM | POA: Diagnosis not present

## 2021-04-25 DIAGNOSIS — D1801 Hemangioma of skin and subcutaneous tissue: Secondary | ICD-10-CM | POA: Diagnosis not present

## 2021-04-25 DIAGNOSIS — L821 Other seborrheic keratosis: Secondary | ICD-10-CM | POA: Diagnosis not present

## 2021-04-25 DIAGNOSIS — D225 Melanocytic nevi of trunk: Secondary | ICD-10-CM | POA: Diagnosis not present

## 2021-06-25 ENCOUNTER — Telehealth: Payer: Self-pay

## 2021-06-25 DIAGNOSIS — E785 Hyperlipidemia, unspecified: Secondary | ICD-10-CM

## 2021-06-25 DIAGNOSIS — R7301 Impaired fasting glucose: Secondary | ICD-10-CM

## 2021-06-25 DIAGNOSIS — I10 Essential (primary) hypertension: Secondary | ICD-10-CM

## 2021-06-25 NOTE — Telephone Encounter (Signed)
Pt has appt 07/05/21. Wants to know if she should have labwork done early to go over results with Dr C. She knows she needs cholesterol checked.

## 2021-06-25 NOTE — Telephone Encounter (Signed)
Please see message and advise.  Thank you. ° °

## 2021-06-27 NOTE — Telephone Encounter (Signed)
Lab orders placed. Pt can schedule fasting lab appt

## 2021-06-28 DIAGNOSIS — L309 Dermatitis, unspecified: Secondary | ICD-10-CM | POA: Diagnosis not present

## 2021-06-28 DIAGNOSIS — D225 Melanocytic nevi of trunk: Secondary | ICD-10-CM | POA: Diagnosis not present

## 2021-06-28 NOTE — Telephone Encounter (Signed)
Patient notified and verbalized understanding. Lab appointment scheduled. 

## 2021-07-02 ENCOUNTER — Other Ambulatory Visit: Payer: Self-pay

## 2021-07-02 ENCOUNTER — Other Ambulatory Visit (INDEPENDENT_AMBULATORY_CARE_PROVIDER_SITE_OTHER): Payer: Self-pay

## 2021-07-02 DIAGNOSIS — I10 Essential (primary) hypertension: Secondary | ICD-10-CM

## 2021-07-02 DIAGNOSIS — E785 Hyperlipidemia, unspecified: Secondary | ICD-10-CM

## 2021-07-02 DIAGNOSIS — R7301 Impaired fasting glucose: Secondary | ICD-10-CM

## 2021-07-02 LAB — HEMOGLOBIN A1C: Hgb A1c MFr Bld: 5.9 % (ref 4.6–6.5)

## 2021-07-02 LAB — COMPREHENSIVE METABOLIC PANEL
ALT: 16 U/L (ref 0–35)
AST: 16 U/L (ref 0–37)
Albumin: 4.3 g/dL (ref 3.5–5.2)
Alkaline Phosphatase: 90 U/L (ref 39–117)
BUN: 20 mg/dL (ref 6–23)
CO2: 25 mEq/L (ref 19–32)
Calcium: 9.7 mg/dL (ref 8.4–10.5)
Chloride: 108 mEq/L (ref 96–112)
Creatinine, Ser: 0.7 mg/dL (ref 0.40–1.20)
GFR: 91.99 mL/min (ref >60.00)
Glucose, Bld: 107 mg/dL — ABNORMAL HIGH (ref 70–99)
Potassium: 4.3 mEq/L (ref 3.5–5.1)
Sodium: 142 mEq/L (ref 135–145)
Total Bilirubin: 0.5 mg/dL (ref 0.2–1.2)
Total Protein: 6.5 g/dL (ref 6.0–8.3)

## 2021-07-02 LAB — LIPID PANEL
Cholesterol: 246 mg/dL — ABNORMAL HIGH (ref 0–200)
HDL: 40.8 mg/dL (ref >39.00)
LDL Cholesterol: 173 mg/dL — ABNORMAL HIGH (ref 0–99)
NonHDL: 205.41
Total CHOL/HDL Ratio: 6
Triglycerides: 160 mg/dL — ABNORMAL HIGH (ref 0.0–149.0)
VLDL: 32 mg/dL (ref 0.0–40.0)

## 2021-07-02 NOTE — Telephone Encounter (Signed)
error 

## 2021-07-02 NOTE — Progress Notes (Signed)
Per orders of Dr. Bryan Lemma pt is here for labs pt tolerated draw well.

## 2021-07-05 ENCOUNTER — Encounter: Payer: Self-pay | Admitting: Family Medicine

## 2021-07-05 ENCOUNTER — Other Ambulatory Visit: Payer: Self-pay

## 2021-07-05 ENCOUNTER — Ambulatory Visit (INDEPENDENT_AMBULATORY_CARE_PROVIDER_SITE_OTHER): Payer: Self-pay | Admitting: Family Medicine

## 2021-07-05 VITALS — BP 158/80 | HR 78 | Temp 98.0°F | Wt 168.8 lb

## 2021-07-05 DIAGNOSIS — E785 Hyperlipidemia, unspecified: Secondary | ICD-10-CM

## 2021-07-05 DIAGNOSIS — R7301 Impaired fasting glucose: Secondary | ICD-10-CM

## 2021-07-05 DIAGNOSIS — I10 Essential (primary) hypertension: Secondary | ICD-10-CM

## 2021-07-05 MED ORDER — LISINOPRIL 5 MG PO TABS: 5.0000 mg | ORAL_TABLET | Freq: Every day | ORAL | 3 refills | Status: DC

## 2021-07-05 NOTE — Progress Notes (Signed)
[name] [name] is a 65 y.o. female  Chief Complaint  Patient presents with   Follow-up    Cholesterol recheck    HPI: [name] [name] is a 65 y.o. female patient seen today for f/u on HLD, IFG, and HTN.  Pt is not on Rx meds for any of these diagnoses. She had labs done on 07/02/21 ahead of this appt. She is swimming for exercise. She is trying to limit junk/sweets.  She is a smoker. She filled Rx for zetia but did not take it. She is afraid of potential side effects.  BP elevated today and at previous visits. Pt thinks it is stress and/or pain related.   Component     Latest Ref Rng & Units 05/18/2020 01/31/2021 07/02/2021  Cholesterol     0 - 200 mg/dL 223 (H) 237 (H) 246 (H)  Triglycerides     0.0 - 149.0 mg/dL 182.0 (H) 202.0 (H) 160.0 (H)  HDL Cholesterol     >39.00 mg/dL 42.80 37.60 (L) 40.80  VLDL     0.0 - 40.0 mg/dL 36.4 40.4 (H) 32.0  LDL (calc)     0 - 99 mg/dL 144 (H)  173 (H)  Total CHOL/HDL Ratio      5 6 6   NonHDL      180.69 198.99 205.41    The 10-year ASCVD risk score Mikey Bussing DC Jr., et al., 2013) is: 16.7%   Values used to calculate the score:     Age: 63 years     Sex: Female     Is Non-Hispanic African American: No     Diabetic: No     Tobacco smoker: Yes     Systolic Blood Pressure: 496 mmHg     Is BP treated: No     HDL Cholesterol: 40.8 mg/dL     Total Cholesterol: 246 mg/dL   Component     Latest Ref Rng & Units 05/18/2020 01/31/2021 07/02/2021  Hemoglobin A1C     4.6 - 6.5 % 6.0 5.8 5.9   Lab Results  Component Value Date   NA 142 07/02/2021   K 4.3 07/02/2021   CREATININE 0.70 07/02/2021   GFRNONAA >60 01/25/2018   GFRAA >60 01/25/2018   GLUCOSE 107 (H) 07/02/2021     Past Medical History:  Diagnosis Date   Anxiety    Arthritis    HANDS,  KNEES   Bone tumor (benign)    RIGHT FEMUR   Borderline hypertension    Female pelvic pain    Fibromyalgia    Left knee injury    TENDONDINITIS/  CHONDRAMALIA   PONV (postoperative nausea  and vomiting)     Past Surgical History:  Procedure Laterality Date   DILATION AND CURETTAGE OF [place]   jaw tumor Right 2020   right lower jaw tumor removed 2020 by Dr. Loyal Gambler   LAPAROSCOPIC ASSISTED VAGINAL HYSTERECTOMY  01-25-2003   LAPAROSCOPY N/A 04/18/2014   Procedure: LAPAROSCOPY DIAGNOSTIC;  Surgeon: Margarette Asal, MD;  Location: [facility];  Service: Gynecology;  Laterality: N/A;   NASAL SEPTUM SURGERY  AGE 75   OPEN REDUCTION INTERNAL FIXATION (ORIF) DISTAL RADIAL FRACTURE Left 09/18/2020   Procedure: OPEN TREATMENT OF LEFT DISTAL RADIUS FRACTURE;  Surgeon: Milly Jakob, MD;  Location: [facility];  Service: Orthopedics;  Laterality: Left;  LENGTH OF SURGERY: 75 MIN  PRE OP BLOCK   SALPINGOOPHORECTOMY Bilateral 04/18/2014  Procedure: SALPINGO OOPHORECTOMY;  Surgeon: Margarette Asal, MD;  Location: [facility];  Service: Gynecology;  Laterality: Bilateral;   TONSILLECTOMY  AGE 35   TUBAL LIGATION      Social History   Socioeconomic History   Marital status: Married    Spouse name: Not on file   Number of children: Not on file   Years of education: Not on file   Highest education level: Not on file  Occupational History   Not on file  Tobacco Use   Smoking status: Every Day    Packs/day: 0.50    Years: 42.00    Pack years: 21.00    Types: Cigarettes    Start date: 67   Smokeless tobacco: Never  Substance and Sexual Activity   Alcohol use: Yes    Comment: rare   Drug use: No   Sexual activity: Not on file  Other Topics Concern   Not on file  Social History Narrative   Not on file   Social Determinants of Health   Financial Resource Strain: Not on file  Food Insecurity: Not on file  Transportation Needs: Not on file  Physical Activity: Not on file  Stress: Not on file  Social Connections: Not on file  Intimate Partner Violence: Not on file    Family History   Problem Relation Age of Onset   Colon cancer Neg Hx    Stomach cancer Neg Hx      Immunization History  Administered Date(s) Administered   Influenza,inj,Quad PF,6+ Mos 11/19/2017, 10/01/2018   Moderna Sars-Covid-2 Vaccination 07/17/2020, 08/24/2020   Tdap 11/19/2017    Outpatient Encounter Medications as of 07/05/2021  Medication Sig   ALPRAZolam (XANAX) 0.25 MG tablet Take 1 tablet (0.25 mg total) by mouth daily as needed.   Cholecalciferol (VITAMIN D3) 2000 UNITS TABS Take 1 capsule by mouth daily.   fluticasone (FLONASE) 50 MCG/ACT nasal spray Place 2 sprays into both nostrils daily.   Magnesium 250 MG TABS Take 1 tablet by mouth daily.   Omega-3 Fatty Acids (OMEGA 3 PO) Take by mouth.   vitamin E 400 UNIT capsule Take 400 Units by mouth daily.    [DISCONTINUED] meloxicam (MOBIC) 15 MG tablet Take 15 mg by mouth daily. (Patient not taking: Reported on 07/05/2021)   No facility-administered encounter medications on file as of 07/05/2021.     ROS: Gen: no fever, chills  Eyes: no blurry vision, double vision Resp: no cough, wheeze,SOB CV: no CP, palpitations, LE edema,  Neuro: no dizziness, headache, weakness   Allergies  Allergen Reactions   Hydrocodone Rash   Demerol [Meperidine] Nausea And Vomiting    severe   Molds & Smuts    Sulfa Antibiotics Swelling and Rash    BP (!) 158/80 (BP Location: Right Arm, Patient Position: Sitting, Cuff Size: Normal)   Pulse 78   Temp 98 F (36.7 C) (Temporal)   Wt 168 lb 12.8 oz (76.6 kg)   SpO2 98%   BMI 25.67 kg/m  Wt Readings from Last 3 Encounters:  07/05/21 168 lb 12.8 oz (76.6 kg)  04/24/21 169 lb (76.7 kg)  01/31/21 173 lb 9.6 oz (78.7 kg)   Temp Readings from Last 3 Encounters:  07/05/21 98 F (36.7 C) (Temporal)  04/24/21 98 F (36.7 C) (Temporal)  01/31/21 97.8 F (36.6 C) (Temporal)   BP Readings from Last 3 Encounters:  07/05/21 (!) 158/80  04/24/21 136/76  01/31/21 (!) 160/86   Pulse Readings from  Last  3 Encounters:  07/05/21 78  04/24/21 75  01/31/21 82     Physical Exam Constitutional:      General: She is not in acute distress.    Appearance: She is not ill-appearing.  Cardiovascular:     Rate and Rhythm: Normal rate and regular rhythm.     Pulses: Normal pulses.  Pulmonary:     Effort: Pulmonary effort is normal. No respiratory distress.     Breath sounds: No wheezing or rhonchi.  Neurological:     Mental Status: She is alert and oriented to person, place, and time.  Psychiatric:        Mood and Affect: Mood normal.        Behavior: Behavior normal.     A/P:   1. Hyperlipidemia, unspecified hyperlipidemia type - ASCVD = 16.7% - pt is adamant about no statin - agrees to start zetia 10mg  daily - f/u in 6 mo  2. Primary hypertension Rx: - lisinopril (ZESTRIL) 5 MG tablet; Take 1 tablet (5 mg total) by mouth daily.  Dispense: 90 tablet; Refill: 3  3. IFG (impaired fasting glucose) - A1C = 5.9   I spent 30 min with the patient today discussing labs, treatment recommendations, plan of care, and f/u   This visit occurred during the SARS-CoV-2 public health emergency.  Safety protocols were in place, including screening questions prior to the visit, additional usage of staff PPE, and extensive cleaning of exam room while observing appropriate contact time as indicated for disinfecting solutions.

## 2021-07-05 NOTE — Patient Instructions (Addendum)
Start zetia 1 tab daily for cholesterol Start lisinopril 5mg  1 tab daily for blood pressure  Component     Latest Ref Rng & Units 05/18/2020 01/31/2021 07/02/2021  Cholesterol     0 - 200 mg/dL 223 (H) 237 (H) 246 (H)  Triglycerides     0.0 - 149.0 mg/dL 182.0 (H) 202.0 (H) 160.0 (H)  HDL Cholesterol     >39.00 mg/dL 42.80 37.60 (L) 40.80  VLDL     0.0 - 40.0 mg/dL 36.4 40.4 (H) 32.0  LDL (calc)     0 - 99 mg/dL 144 (H)  173 (H)  Total CHOL/HDL Ratio      5 6 6   NonHDL      180.69 198.99 205.41    The 10-year ASCVD risk score Mikey Bussing DC Jr., et al., 2013) is: 12.8% - above 7.5% and it is recommended to start statin (cholesterol-lowering medication)   Values used to calculate the score:     Age: 65 years     Sex: Female     Is Non-Hispanic African American: No     Diabetic: No     Tobacco smoker: Yes     Systolic Blood Pressure: 846 mmHg     Is BP treated: No     HDL Cholesterol: 40.8 mg/dL     Total Cholesterol: 246 mg/dL   Component     Latest Ref Rng & Units 05/18/2020 01/31/2021 07/02/2021  Hemoglobin A1C     4.6 - 6.5 % 6.0 5.8 5.9   Heart-Healthy Eating Plan Many factors influence your heart (coronary) health, including eating and exercise habits. Coronary risk increases with abnormal blood fat (lipid) levels. Heart-healthy meal planning includes limiting unhealthy fats,increasing healthy fats, and making other diet and lifestyle changes. What is my plan? Your health care provider may recommend that you: Limit your fat intake to _________% or less of your total calories each day. Limit your saturated fat intake to _________% or less of your total calories each day. Limit the amount of cholesterol in your diet to less than _________ mg per day. What are tips for following this plan? Cooking Cook foods using methods other than frying. Baking, boiling, grilling, and broiling are all good options. Other ways to reduce fat include: Removing the skin from poultry. Removing all  visible fats from meats. Steaming vegetables in water or broth. Meal planning  At meals, imagine dividing your plate into fourths: Fill one-half of your plate with vegetables and green salads. Fill one-fourth of your plate with whole grains. Fill one-fourth of your plate with lean protein foods. Eat 4-5 servings of vegetables per day. One serving equals 1 cup raw or cooked vegetable, or 2 cups raw leafy greens. Eat 4-5 servings of fruit per day. One serving equals 1 medium whole fruit,  cup dried fruit,  cup fresh, frozen, or canned fruit, or  cup 100% fruit juice. Eat more foods that contain soluble fiber. Examples include apples, broccoli, carrots, beans, peas, and barley. Aim to get 25-30 g of fiber per day. Increase your consumption of legumes, nuts, and seeds to 4-5 servings per week. One serving of dried beans or legumes equals  cup cooked, 1 serving of nuts is  cup, and 1 serving of seeds equals 1 tablespoon.  Fats Choose healthy fats more often. Choose monounsaturated and polyunsaturated fats, such as olive and canola oils, flaxseeds, walnuts, almonds, and seeds. Eat more omega-3 fats. Choose salmon, mackerel, sardines, tuna, flaxseed oil, and ground flaxseeds. Aim to  eat fish at least 2 times each week. Check food labels carefully to identify foods with trans fats or high amounts of saturated fat. Limit saturated fats. These are found in animal products, such as meats, butter, and cream. Plant sources of saturated fats include palm oil, palm kernel oil, and coconut oil. Avoid foods with partially hydrogenated oils in them. These contain trans fats. Examples are stick margarine, some tub margarines, cookies, crackers, and other baked goods. Avoid fried foods. General information Eat more home-cooked food and less restaurant, buffet, and fast food. Limit or avoid alcohol. Limit foods that are high in starch and sugar. Lose weight if you are overweight. Losing just 5-10% of your  body weight can help your overall health and prevent diseases such as diabetes and heart disease. Monitor your salt (sodium) intake, especially if you have high blood pressure. Talk with your health care provider about your sodium intake. Try to incorporate more vegetarian meals weekly. What foods can I eat? Fruits All fresh, canned (in natural juice), or frozen fruits. Vegetables Fresh or frozen vegetables (raw, steamed, roasted, or grilled). Green salads. Grains Most grains. Choose whole wheat and whole grains most of the time. Rice andpasta, including brown rice and pastas made with whole wheat. Meats and other proteins Lean, well-trimmed beef, veal, pork, and lamb. Chicken and [place] without skin. All fish and shellfish. Wild duck, rabbit, pheasant, and venison. Egg whites or low-cholesterol egg substitutes. Dried beans, peas, lentils, and tofu. Seedsand most nuts. Dairy Low-fat or nonfat cheeses, including ricotta and mozzarella. Skim or 1% milk (liquid, powdered, or evaporated). Buttermilk made with low-fat milk. Nonfat orlow-fat yogurt. Fats and oils Non-hydrogenated (trans-free) margarines. Vegetable oils, including soybean, sesame, sunflower, olive, peanut, safflower, corn, canola, and cottonseed. Salad dressings or mayonnaisemade with a vegetable oil. Beverages Water (mineral or sparkling). Coffee and tea. Diet carbonated beverages. Sweets and desserts Sherbet, gelatin, and fruit ice. Small amounts of dark chocolate. Limit all sweets and desserts. Seasonings and condiments All seasonings and condiments. The items listed above may not be a complete list of foods and beverages you can eat. Contact a dietitian for more options. What foods are not recommended? Fruits Canned fruit in heavy syrup. Fruit in cream or butter sauce. Fried fruit. Limitcoconut. Vegetables Vegetables cooked in cheese, cream, or butter sauce. Fried vegetables. Grains Breads made with saturated or trans  fats, oils, or whole milk. Croissants. Sweet rolls. Donuts. High-fat crackers,such as cheese crackers. Meats and other proteins Fatty meats, such as hot dogs, ribs, sausage, bacon, rib-eye roast or steak. High-fat deli meats, such as salami and bologna. Caviar. Domestic duck andgoose. Organ meats, such as liver. Dairy Cream, sour cream, cream cheese, and creamed cottage cheese. Whole milk cheeses. Whole or 2% milk (liquid, evaporated, or condensed). Whole buttermilk.Cream sauce or high-fat cheese sauce. Whole-milk yogurt. Fats and oils Meat fat, or shortening. Cocoa butter, hydrogenated oils, palm oil, coconut oil, palm kernel oil. Solid fats and shortenings, including bacon fat, salt pork, lard, and butter. Nondairy cream substitutes. Salad dressings with cheeseor sour cream. Beverages Regular sodas and any drinks with added sugar. Sweets and desserts Frosting. Pudding. Cookies. Cakes. Pies. Milk chocolate or white chocolate.Buttered syrups. Full-fat ice cream or ice cream drinks. The items listed above may not be a complete list of foods and beverages to avoid. Contact a dietitian for more information. Summary Heart-healthy meal planning includes limiting unhealthy fats, increasing healthy fats, and making other diet and lifestyle changes. Lose weight if you are overweight. Losing  just 5-10% of your body weight can help your overall health and prevent diseases such as diabetes and heart disease. Focus on eating a balance of foods, including fruits and vegetables, low-fat or nonfat dairy, lean protein, nuts and legumes, whole grains, and heart-healthy oils and fats. This information is not intended to replace advice given to you by your health care provider. Make sure you discuss any questions you have with your healthcare provider. Document Revised: 01/09/2018 Document Reviewed: 01/09/2018 Elsevier Patient Education  2022 [employer].

## 2021-07-19 DIAGNOSIS — Z6825 Body mass index (BMI) 25.0-25.9, adult: Secondary | ICD-10-CM | POA: Diagnosis not present

## 2021-07-19 DIAGNOSIS — Z1231 Encounter for screening mammogram for malignant neoplasm of breast: Secondary | ICD-10-CM | POA: Diagnosis not present

## 2021-07-19 DIAGNOSIS — Z01419 Encounter for gynecological examination (general) (routine) without abnormal findings: Secondary | ICD-10-CM | POA: Diagnosis not present

## 2021-09-12 DIAGNOSIS — Z13820 Encounter for screening for osteoporosis: Secondary | ICD-10-CM | POA: Diagnosis not present

## 2021-10-01 ENCOUNTER — Telehealth: Payer: Self-pay | Admitting: Family Medicine

## 2021-10-01 DIAGNOSIS — E785 Hyperlipidemia, unspecified: Secondary | ICD-10-CM

## 2021-10-01 MED ORDER — EZETIMIBE 10 MG PO TABS: 10.0000 mg | ORAL_TABLET | Freq: Every day | ORAL | 3 refills | Status: DC

## 2021-10-01 NOTE — Telephone Encounter (Signed)
Spoke to patient and she states that she did tell the pharmacy that she wasn't taking the Zetia and also reported in 04/2021 that she wasn't taking it to Dr Gena Fray.   She would love to get a refill on this medication.  TOC scheduled on 12/25/21 at 1:00 pm.  Are you willing to refill this and if so when should she have her cholesterol checked again since she has been taking Zetia since July?  Please review and advise.  Thanks. Dm/cma

## 2021-10-02 NOTE — Telephone Encounter (Signed)
Patient notified VIA phone. No further questions. Dm/cma  

## 2021-12-05 ENCOUNTER — Telehealth: Payer: Self-pay | Admitting: Family Medicine

## 2021-12-05 DIAGNOSIS — U071 COVID-19: Secondary | ICD-10-CM | POA: Diagnosis not present

## 2021-12-05 NOTE — Telephone Encounter (Signed)
Pt has just found out she is covid positive from an Urgent Care today. She was prescribed Paxlovid 150 100mg  2 pills a day. She was asked to call in with her PCP and make sure this script would not impact the other medications she is taking. Please advise pt at [phone].

## 2021-12-06 NOTE — Telephone Encounter (Signed)
Patient aware to reduce Alprazolam to 50% per patient she have not had to take Alprazolam in a few weeks and will follow if needing to take.

## 2021-12-11 ENCOUNTER — Encounter: Payer: Self-pay | Admitting: Family

## 2021-12-11 ENCOUNTER — Other Ambulatory Visit: Payer: Self-pay

## 2021-12-11 ENCOUNTER — Ambulatory Visit (INDEPENDENT_AMBULATORY_CARE_PROVIDER_SITE_OTHER): Payer: Self-pay | Admitting: Family

## 2021-12-11 VITALS — BP 138/84 | HR 93 | Temp 97.8°F | Resp 17 | Wt 169.2 lb

## 2021-12-11 DIAGNOSIS — J0100 Acute maxillary sinusitis, unspecified: Secondary | ICD-10-CM

## 2021-12-11 DIAGNOSIS — U099 Post covid-19 condition, unspecified: Secondary | ICD-10-CM

## 2021-12-11 DIAGNOSIS — J019 Acute sinusitis, unspecified: Secondary | ICD-10-CM

## 2021-12-11 DIAGNOSIS — B9689 Other specified bacterial agents as the cause of diseases classified elsewhere: Secondary | ICD-10-CM

## 2021-12-11 MED ORDER — FLUTICASONE PROPIONATE 50 MCG/ACT NA SUSP: 2.0000 | Freq: Every day | NASAL | 6 refills | Status: DC

## 2021-12-11 MED ORDER — AMOXICILLIN 500 MG PO CAPS: 500.0000 mg | ORAL_CAPSULE | Freq: Three times a day (TID) | ORAL | 0 refills | Status: DC

## 2021-12-12 NOTE — Progress Notes (Signed)
Acute Office Visit  Subjective:    Patient ID: [name] [name], female    DOB: 02-28-1956, 64 y.o.   MRN: [id number]  Chief Complaint  Patient presents with   Acute Visit    Pt c/o, pt was Covid positive, last week on Wednesday, sore throat cough ear pain headache sinus pressure.     HPI Patient is in today with c/o sinus pressure and pain, headaches, thick yellow phlegm x3-4 days. She was diagnosed with COVID last week and s out of isolation. She reports beginning to feel better then she developed sinus pressure that has worsened. She was taking Paxlovid and Promethazine DM that she believes may have helped.   Past Medical History:  Diagnosis Date   Anxiety    Arthritis    HANDS,  KNEES   Bone tumor (benign)    RIGHT FEMUR   Borderline hypertension    Female pelvic pain    Fibromyalgia    Left knee injury    TENDONDINITIS/  CHONDRAMALIA   PONV (postoperative nausea and vomiting)     Past Surgical History:  Procedure Laterality Date   DILATION AND CURETTAGE OF UTERUS  1986  &  1988   RETAINED PLACENTA   jaw tumor Right 2020   right lower jaw tumor removed 2020 by Dr. Loyal Gambler   LAPAROSCOPIC ASSISTED VAGINAL HYSTERECTOMY  01-25-2003   LAPAROSCOPY N/A 04/18/2014   Procedure: LAPAROSCOPY DIAGNOSTIC;  Surgeon: Margarette Asal, MD;  Location: [facility];  Service: Gynecology;  Laterality: N/A;   NASAL SEPTUM SURGERY  AGE 52   OPEN REDUCTION INTERNAL FIXATION (ORIF) DISTAL RADIAL FRACTURE Left 09/18/2020   Procedure: OPEN TREATMENT OF LEFT DISTAL RADIUS FRACTURE;  Surgeon: Milly Jakob, MD;  Location: [place];  Service: Orthopedics;  Laterality: Left;  LENGTH OF SURGERY: 75 MIN  PRE OP BLOCK   SALPINGOOPHORECTOMY Bilateral 04/18/2014   Procedure: SALPINGO OOPHORECTOMY;  Surgeon: Margarette Asal, MD;  Location: [facility];  Service: Gynecology;  Laterality: Bilateral;   TONSILLECTOMY  AGE 61   TUBAL LIGATION       Family History  Problem Relation Age of Onset   Colon cancer Neg Hx    Stomach cancer Neg Hx     Social History   Socioeconomic History   Marital status: Married    Spouse name: Not on file   Number of children: Not on file   Years of education: Not on file   Highest education level: Not on file  Occupational History   Not on file  Tobacco Use   Smoking status: Every Day    Packs/day: 0.50    Years: 42.00    Pack years: 21.00    Types: Cigarettes    Start date: 87   Smokeless tobacco: Never  Substance and Sexual Activity   Alcohol use: Yes    Comment: rare   Drug use: No   Sexual activity: Not on file  Other Topics Concern   Not on file  Social History Narrative   Not on file   Social Determinants of Health   Financial Resource Strain: Not on file  Food Insecurity: Not on file  Transportation Needs: Not on file  Physical Activity: Not on file  Stress: Not on file  Social Connections: Not on file  Intimate Partner Violence: Not on file    Outpatient Medications Prior to Visit  Medication Sig Dispense Refill   ALPRAZolam (XANAX) 0.25 MG tablet Take 1 tablet (0.25  mg total) by mouth daily as needed. 30 tablet 2   Cholecalciferol (VITAMIN D3) 2000 UNITS TABS Take 1 capsule by mouth daily.     ezetimibe (ZETIA) 10 MG tablet Take 1 tablet (10 mg total) by mouth daily. 90 tablet 3   lisinopril (ZESTRIL) 5 MG tablet Take 1 tablet (5 mg total) by mouth daily. 90 tablet 3   Magnesium 250 MG TABS Take 1 tablet by mouth daily.     Omega-3 Fatty Acids (OMEGA 3 PO) Take by mouth.     vitamin E 400 UNIT capsule Take 400 Units by mouth daily.      fluticasone (FLONASE) 50 MCG/ACT nasal spray Place 2 sprays into both nostrils daily. 16 g 6   No facility-administered medications prior to visit.    Allergies  Allergen Reactions   Hydrocodone Rash   Demerol [Meperidine] Nausea And Vomiting    severe   Molds & Smuts    Sulfa Antibiotics  Swelling and Rash    Review of Systems  Constitutional:  Negative for fever.  HENT:  Positive for congestion, postnasal drip, sinus pressure and sinus pain.   Respiratory:  Positive for cough.   Cardiovascular: Negative.   Endocrine: Negative.   Musculoskeletal: Negative.   Skin: Negative.   Allergic/Immunologic: Negative.   Neurological: Negative.   Psychiatric/Behavioral: Negative.    All other systems reviewed and are negative.     Objective:    Physical Exam Vitals and nursing note reviewed.  Constitutional:      Appearance: Normal appearance.  HENT:     Right Ear: Tympanic membrane and ear canal normal.     Left Ear: Tympanic membrane and ear canal normal.     Nose:     Comments: Maxillary and frontal sinus tenderness to palpation.     Mouth/Throat:     Mouth: Mucous membranes are dry.  Cardiovascular:     Rate and Rhythm: Normal rate and regular rhythm.  Pulmonary:     Effort: Pulmonary effort is normal.     Breath sounds: Normal breath sounds.  Musculoskeletal:        General: Normal range of motion.     Cervical back: Normal range of motion and neck supple.  Skin:    General: Skin is warm and dry.  Neurological:     Mental Status: She is alert.  Psychiatric:        Mood and Affect: Mood normal.        Behavior: Behavior normal.   BP 138/84 (BP Location: Left Arm, Patient Position: Sitting, Cuff Size: Normal)    Pulse 93    Temp 97.8 F (36.6 C) (Temporal)    Resp 17    Wt 169 lb 3.2 oz (76.7 kg)    SpO2 96%    BMI 25.73 kg/m  Wt Readings from Last 3 Encounters:  12/11/21 169 lb 3.2 oz (76.7 kg)  07/05/21 168 lb 12.8 oz (76.6 kg)  04/24/21 169 lb (76.7 kg)    Health Maintenance Due  Topic Date Due   Pneumococcal Vaccine 49-61 Years old (1 - PCV) Never done   HIV Screening  Never done   Zoster Vaccines- Shingrix (1 of 2) Never done   MAMMOGRAM  04/24/2019   COVID-19 Vaccine (3 - Booster for Moderna series) 10/19/2020   INFLUENZA VACCINE   07/16/2021    There are no preventive care reminders to display for this patient.   Lab Results  Component Value Date   TSH 2.18 08/12/2019  Lab Results  Component Value Date   WBC 7.4 08/12/2019   HGB 14.7 08/12/2019   HCT 44.7 08/12/2019   MCV 93.8 08/12/2019   PLT 307.0 08/12/2019   Lab Results  Component Value Date   NA 142 07/02/2021   K 4.3 07/02/2021   CO2 25 07/02/2021   GLUCOSE 107 (H) 07/02/2021   BUN 20 07/02/2021   CREATININE 0.70 07/02/2021   BILITOT 0.5 07/02/2021   ALKPHOS 90 07/02/2021   AST 16 07/02/2021   ALT 16 07/02/2021   PROT 6.5 07/02/2021   ALBUMIN 4.3 07/02/2021   CALCIUM 9.7 07/02/2021   ANIONGAP 12 01/25/2018   GFR 91.99 07/02/2021   Lab Results  Component Value Date   CHOL 246 (H) 07/02/2021   Lab Results  Component Value Date   HDL 40.80 07/02/2021   Lab Results  Component Value Date   LDLCALC 173 (H) 07/02/2021   Lab Results  Component Value Date   TRIG 160.0 (H) 07/02/2021   Lab Results  Component Value Date   CHOLHDL 6 07/02/2021   Lab Results  Component Value Date   HGBA1C 5.9 07/02/2021       Assessment & Plan:   Problem List Items Addressed This Visit   None Visit Diagnoses     Acute bacterial sinusitis    -  Primary   Relevant Medications   amoxicillin (AMOXIL) 500 MG capsule   fluticasone (FLONASE) 50 MCG/ACT nasal spray   Acute non-recurrent maxillary sinusitis       Relevant Medications   amoxicillin (AMOXIL) 500 MG capsule   fluticasone (FLONASE) 50 MCG/ACT nasal spray   Post-COVID-19 condition            Meds ordered this encounter  Medications   amoxicillin (AMOXIL) 500 MG capsule    Sig: Take 1 capsule (500 mg total) by mouth 3 (three) times daily for 10 days.    Dispense:  21 capsule    Refill:  0   fluticasone (FLONASE) 50 MCG/ACT nasal spray    Sig: Place 2 sprays into both nostrils daily.    Dispense:  16 g    Refill:  6    Call the office if symptoms worsen or persist.  Recheck as scheduled and sooner as needed.  Kennyth Arnold, FNP

## 2021-12-20 ENCOUNTER — Telehealth: Payer: Self-pay | Admitting: Family Medicine

## 2021-12-20 DIAGNOSIS — J019 Acute sinusitis, unspecified: Secondary | ICD-10-CM

## 2021-12-20 DIAGNOSIS — N6459 Other signs and symptoms in breast: Secondary | ICD-10-CM | POA: Diagnosis not present

## 2021-12-20 DIAGNOSIS — B9689 Other specified bacterial agents as the cause of diseases classified elsewhere: Secondary | ICD-10-CM

## 2021-12-20 MED ORDER — AMOXICILLIN 500 MG PO CAPS
500.0000 mg | ORAL_CAPSULE | Freq: Three times a day (TID) | ORAL | 0 refills | Status: AC
Start: 2021-12-20 — End: 2021-12-23

## 2021-12-20 NOTE — Telephone Encounter (Signed)
Pt is wanting another 3 day supply of her amoxicillin (AMOXIL) 500 MG capsule [[id number]]  that was prescribed to her on 12/11/21. She would like it sent to  [facility], [address]  [place], [place] [address]  Phone:  [phone]  Fax:  [phone]

## 2021-12-20 NOTE — Telephone Encounter (Signed)
Patient notified VIA phone. Dm/cma  

## 2021-12-20 NOTE — Telephone Encounter (Signed)
Spoke to patient, she states that she only received a 7 day supply of Amoxicillian on 12/11/21 by [place]. Called pharmacy to verify and that she did only received #21 which is a 7 day even though directions stated to take for 10 days.  Can you send in a 3 day supply for her? Please review and advise.  Thanks. Dm/cma

## 2021-12-25 ENCOUNTER — Encounter: Payer: Self-pay | Admitting: Family Medicine

## 2021-12-25 ENCOUNTER — Other Ambulatory Visit: Payer: Self-pay

## 2021-12-25 ENCOUNTER — Ambulatory Visit (INDEPENDENT_AMBULATORY_CARE_PROVIDER_SITE_OTHER): Payer: BC Managed Care – PPO | Admitting: Family Medicine

## 2021-12-25 ENCOUNTER — Other Ambulatory Visit: Payer: Self-pay | Admitting: Obstetrics and Gynecology

## 2021-12-25 VITALS — BP 136/78 | HR 70 | Temp 97.0°F | Ht 68.0 in | Wt 171.2 lb

## 2021-12-25 DIAGNOSIS — M797 Fibromyalgia: Secondary | ICD-10-CM

## 2021-12-25 DIAGNOSIS — E785 Hyperlipidemia, unspecified: Secondary | ICD-10-CM | POA: Insufficient documentation

## 2021-12-25 DIAGNOSIS — Z114 Encounter for screening for human immunodeficiency virus [HIV]: Secondary | ICD-10-CM

## 2021-12-25 DIAGNOSIS — M479 Spondylosis, unspecified: Secondary | ICD-10-CM | POA: Diagnosis not present

## 2021-12-25 DIAGNOSIS — I10 Essential (primary) hypertension: Secondary | ICD-10-CM

## 2021-12-25 DIAGNOSIS — M810 Age-related osteoporosis without current pathological fracture: Secondary | ICD-10-CM

## 2021-12-25 DIAGNOSIS — E782 Mixed hyperlipidemia: Secondary | ICD-10-CM

## 2021-12-25 DIAGNOSIS — J301 Allergic rhinitis due to pollen: Secondary | ICD-10-CM

## 2021-12-25 DIAGNOSIS — N644 Mastodynia: Secondary | ICD-10-CM

## 2021-12-25 HISTORY — DX: Hyperlipidemia, unspecified: E78.5

## 2021-12-25 NOTE — Progress Notes (Signed)
[facility] [address] [place] [address] Dept: [phone] Dept Fax: [phone]  Transfer of Care Office Visit  Subjective:    Patient ID: [name] [name], female    DOB: Oct 12, 1956, 66 y.o..   MRN: [id number]  Chief Complaint  Patient presents with   Establish Care    [facility]- establish care.  No concerns.  Will think about flu shot.      History of Present Illness:  Patient is in today to establish care. [name] [name] was born in [place] and grew up in [place]. 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 She has been married for 41 years. She has two daughters (33, 86) and 5 grandchildren. [name] [name] denies drugs and rarely drinsk alcohol. She has been on and off trying to quit tobacco. She currently smokes about 1 cigarette a day. She also uses nicotine patches to try and stop. She notes her husband has lymphoma over the past two years, but is doing well.  [name] [name] has a history of hypertension. She is managed on lisinopril.  [name] [name] has a history of perennial allergic rhinitis. She uses fluticasone nasal spray.  [name] [name] has a history of pain through most of her spine. She relates this is due to degenerative changes. She was offered spinal injections int he past, but declined to proceed with this.  [name] [name] has history of fibromyalgia. She describes diffuse muscle aches that she deals with. She would prefer to be more active, but notes her husband's health and COVID have curtailed some of her routine physical activity.  [name] [name] has a history of hyperlipidemia. She is currently managed on Zetia. She has been reluctant to take a stain due to concerns over it causing muscle soreness, which she feels she struggles with due to her fibromyalgia.  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 Loyal Gambler   LAPAROSCOPIC ASSISTED VAGINAL HYSTERECTOMY  01-25-2003   LAPAROSCOPY N/A 04/18/2014   Procedure: LAPAROSCOPY DIAGNOSTIC;  Surgeon: Margarette Asal, MD;  Location: [facility];  Service: Gynecology;  Laterality: N/A;   NASAL SEPTUM SURGERY  AGE 38   OPEN REDUCTION INTERNAL FIXATION (ORIF) DISTAL RADIAL FRACTURE Left 09/18/2020   Procedure: OPEN TREATMENT OF LEFT DISTAL RADIUS FRACTURE;  Surgeon: Milly Jakob, MD;  Location: [facility];  Service: Orthopedics;  Laterality: Left;  LENGTH OF SURGERY: 75 MIN  PRE OP BLOCK   SALPINGOOPHORECTOMY Bilateral 04/18/2014   Procedure: SALPINGO OOPHORECTOMY;  Surgeon: Margarette Asal, MD;  Location: [facility];  Service: Gynecology;  Laterality: Bilateral;   TONSILLECTOMY  AGE 89   TUBAL LIGATION     Family History  Problem Relation Age of Onset   Diabetes Mother    Heart disease Mother  Cancer Father        Lung   Diabetes Sister    Diabetes Sister    Colon cancer Neg Hx    Stomach cancer Neg Hx    Outpatient Medications Prior to Visit  Medication Sig Dispense Refill   Cholecalciferol (VITAMIN D3) 2000 UNITS TABS Take 1 capsule by mouth daily.     ezetimibe (ZETIA) 10 MG tablet Take 1 tablet (10 mg total) by mouth daily. 90 tablet 3   fluticasone (FLONASE) 50 MCG/ACT nasal spray Place 2 sprays into both nostrils daily. 16 g 6    lisinopril (ZESTRIL) 5 MG tablet Take 1 tablet (5 mg total) by mouth daily. 90 tablet 3   Magnesium 250 MG TABS Take 1 tablet by mouth daily.     vitamin E 400 UNIT capsule Take 400 Units by mouth daily.      Omega-3 Fatty Acids (OMEGA 3 PO) Take by mouth. (Patient not taking: Reported on 12/25/2021)     ALPRAZolam (XANAX) 0.25 MG tablet Take 1 tablet (0.25 mg total) by mouth daily as needed. 30 tablet 2   No facility-administered medications prior to visit.   Allergies  Allergen Reactions   Hydrocodone Rash   Demerol [Meperidine] Nausea And Vomiting    severe   Molds & Smuts    Sulfa Antibiotics Swelling and Rash   Objective:   Today's Vitals   12/25/21 1310  BP: 136/78  Pulse: 70  Temp: (!) 97 F (36.1 C)  TempSrc: Temporal  SpO2: 97%  Weight: 171 lb 3.2 oz (77.7 kg)  Height: 5\' 8"  (1.727 m)   Body mass index is 26.03 kg/m.   General: Well developed, well nourished. No acute distress. Psych: Alert and oriented. Normal mood and affect.  Health Maintenance Due  Topic Date Due   Pneumococcal Vaccine 63-54 Years old (1 - PCV) Never done   Zoster Vaccines- Shingrix (1 of 2) Never done   MAMMOGRAM  04/24/2019   COVID-19 Vaccine (3 - Booster for Moderna series) 10/19/2020   INFLUENZA VACCINE  07/16/2021    Lab Results: BMP Latest Ref Rng & Units 07/02/2021 08/12/2019 01/25/2018  Glucose 70 - 99 mg/dL 107(H) 103(H) 147(H)  BUN 6 - 23 mg/dL 20 20 14   Creatinine 0.40 - 1.20 mg/dL 0.70 0.73 0.69  Sodium 135 - 145 mEq/L 142 141 140  Potassium 3.5 - 5.1 mEq/L 4.3 4.0 3.7  Chloride 96 - 112 mEq/L 108 107 106  CO2 19 - 32 mEq/L 25 27 22   Calcium 8.4 - 10.5 mg/dL 9.7 9.7 10.0   Lab Results  Component Value Date   CHOL 246 (H) 07/02/2021   HDL 40.80 07/02/2021   LDLCALC 173 (H) 07/02/2021   LDLDIRECT 177.0 01/31/2021   TRIG 160.0 (H) 07/02/2021   CHOLHDL 6 07/02/2021    Assessment & Plan:   1. Essential hypertension Blood pressure at goal. Continue lisinopril.  2.  Seasonal allergic rhinitis due to pollen Continue use of Flonase. I recommended she consider addition of a once-daily antihistamine ([place]ra, Claritin, or Zyrtec)  3. Age-related osteoporosis without current pathological fracture Reviewed prior DXA scan (09/10/2019) showing osteoporosis(T-score of -2.7 at right femoral neck). I recommended she add a daily calcium supplement to her Vit D regimen. She is reluctant about taking medications. She is hesitant about treatment for the osteoporosis, feeling she should focus on exercise.  4. Degeneration of spine Stable. Remains active.  5. Fibromyalgia Discussed that primary treatment should be focused on getting regular exercise.  6. Mixed hyperlipidemia I will have her return for fasting lipids.  - Lipid panel  7. Screening for HIV (human immunodeficiency virus)  - HIV Antibody (routine testing w rflx)  Haydee Salter, MD

## 2021-12-26 ENCOUNTER — Telehealth: Payer: Self-pay | Admitting: Family Medicine

## 2021-12-26 NOTE — Telephone Encounter (Signed)
Patient notified VIA phone that it is ordered for a routine health mainitence.   She will call her insurance to make sure they will cover the test.  Dm/cma

## 2021-12-26 NOTE — Telephone Encounter (Signed)
Pt had appt yesterday and she has some concerns she needs to discuss. She is very confused on why she had an HIV test in the lab... I told her Langley Gauss would give her a call back.

## 2022-01-01 ENCOUNTER — Other Ambulatory Visit: Payer: Self-pay

## 2022-01-01 ENCOUNTER — Other Ambulatory Visit (INDEPENDENT_AMBULATORY_CARE_PROVIDER_SITE_OTHER): Payer: BC Managed Care – PPO

## 2022-01-01 DIAGNOSIS — Z114 Encounter for screening for human immunodeficiency virus [HIV]: Secondary | ICD-10-CM

## 2022-01-01 DIAGNOSIS — E782 Mixed hyperlipidemia: Secondary | ICD-10-CM

## 2022-01-01 LAB — LIPID PANEL
Cholesterol: 200 mg/dL (ref 0–200)
HDL: 39 mg/dL — ABNORMAL LOW (ref >39.00)
LDL Cholesterol: 125 mg/dL — ABNORMAL HIGH (ref 0–99)
NonHDL: 160.9
Total CHOL/HDL Ratio: 5
Triglycerides: 179 mg/dL — ABNORMAL HIGH (ref 0.0–149.0)
VLDL: 35.8 mg/dL (ref 0.0–40.0)

## 2022-01-07 ENCOUNTER — Telehealth: Payer: Self-pay | Admitting: Family Medicine

## 2022-01-07 NOTE — Telephone Encounter (Signed)
Patient notified VIA phone of results. She will check with her insurance to see if she is willing to do a referral to a Lipid Clinic. Also advised that she could make an appointment to come in and talk to Dr Gena Fray regarding results/recommendations.   She will think about it and call us back. Dm/cma

## 2022-01-07 NOTE — Telephone Encounter (Signed)
Pt called wanting to discuss lab results.

## 2022-01-07 NOTE — Telephone Encounter (Signed)
Unable to leave VM to rtn call due to mail box is full. Will try later.  Dm/cma

## 2022-01-27 IMAGING — RF DG C-ARM 1-60 MIN
1 series · 3 of 3 positions shown · non-contrast
Comparison: Wrist radiographs from 09/11/2020

CLINICAL DATA: Left wrist ORIF

EXAM:
DG C-ARM 1-60 MIN; LEFT WRIST - COMPLETE 3+ VIEW
FLUOROSCOPY TIME:  Fluoroscopy Time:  42.5 seconds

[Series 1: run · 3 of 3 slices shown]
[im 1/3]
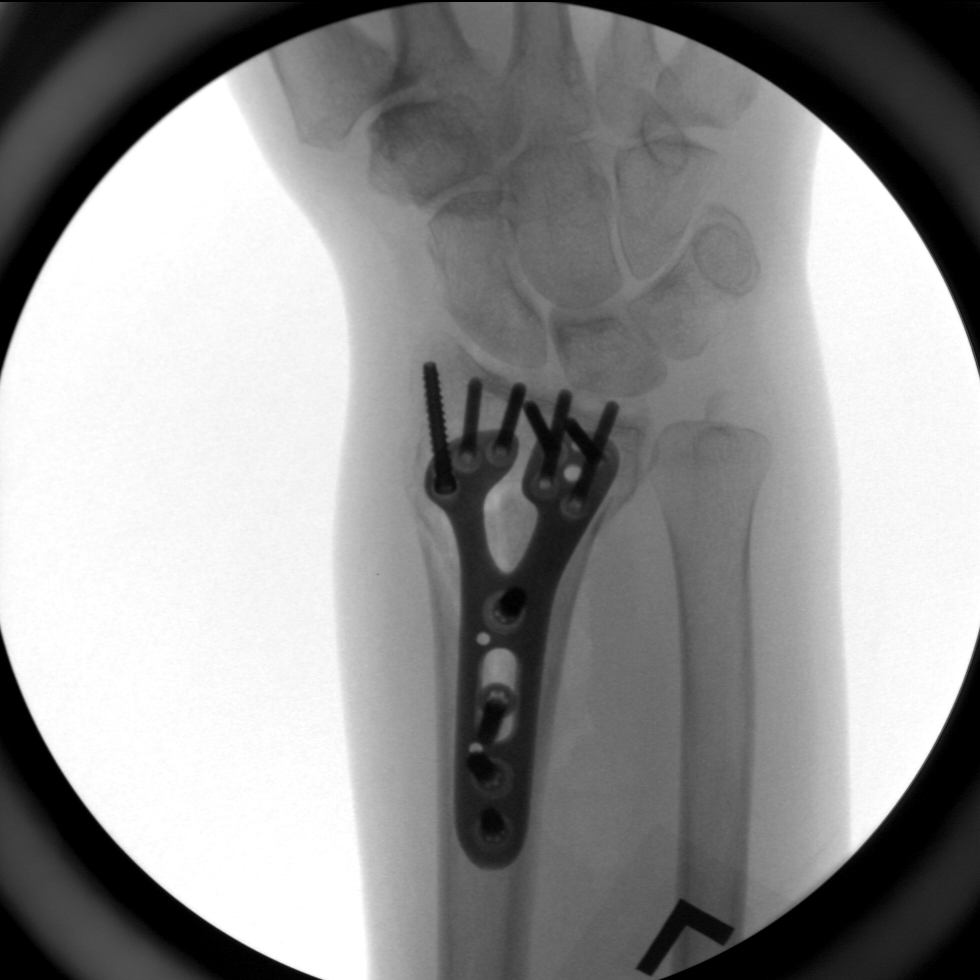
[im 2/3]
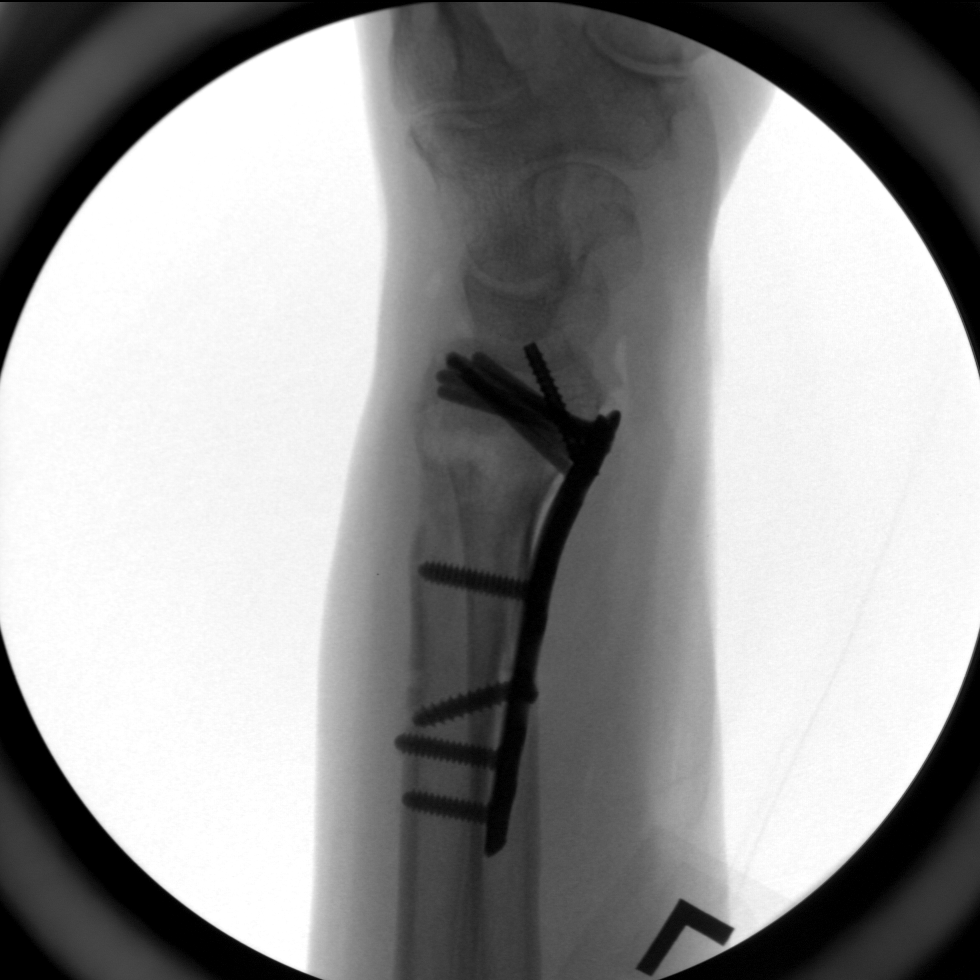
[im 3/3]
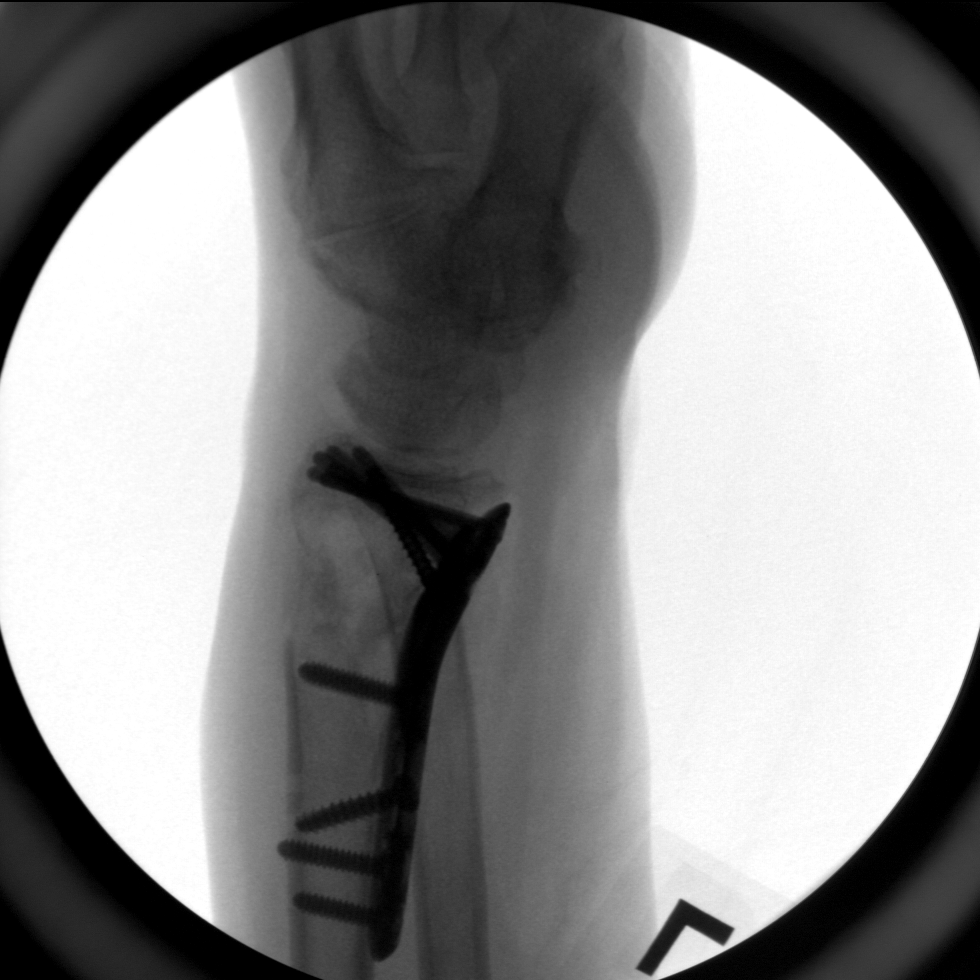

[3 of 3 positions shown; findings below may reference images not displayed]

FINDINGS: 3 C-arm fluoroscopic images were obtained intraoperatively and
submitted for post operative interpretation. These images
demonstrate open reduction internal fixation of a comminuted distal
radial fracture. Please see the performing provider's procedural
report for further detail.
IMPRESSION: Intraoperative fluoroscopic imaging, as above.

## 2022-01-27 IMAGING — RF DG WRIST COMPLETE 3+V*L*
1 series · 3 of 3 positions shown · non-contrast
Comparison: Wrist radiographs from 09/11/2020

CLINICAL DATA: Left wrist ORIF

EXAM:
DG C-ARM 1-60 MIN; LEFT WRIST - COMPLETE 3+ VIEW
FLUOROSCOPY TIME:  Fluoroscopy Time:  42.5 seconds

[Series 1: run · 3 of 3 slices shown]
[im 1/3]
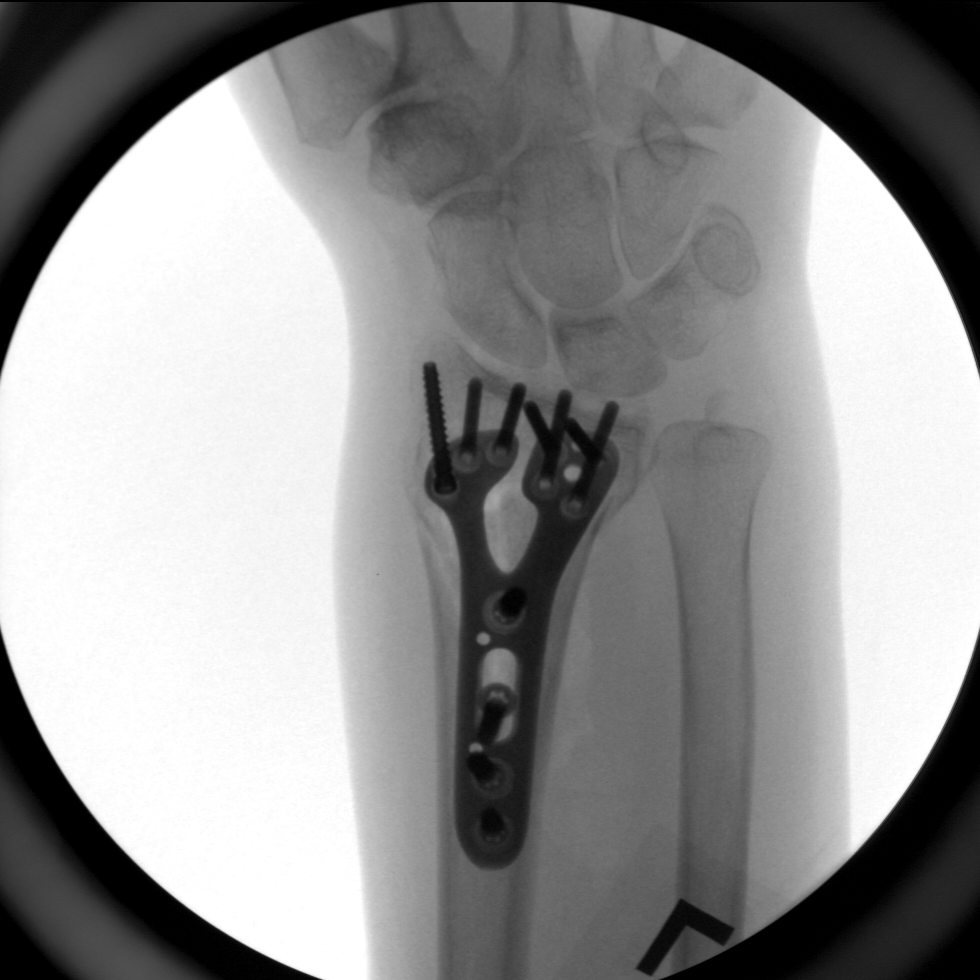
[im 2/3]
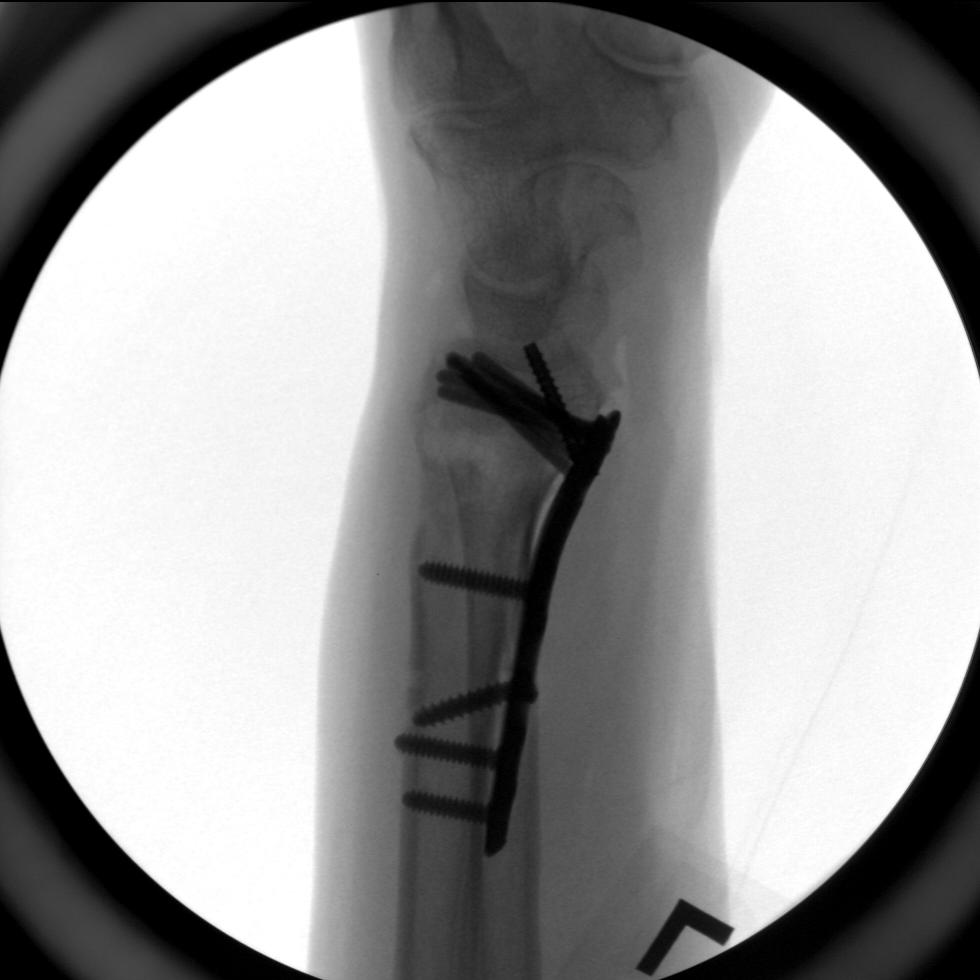
[im 3/3]
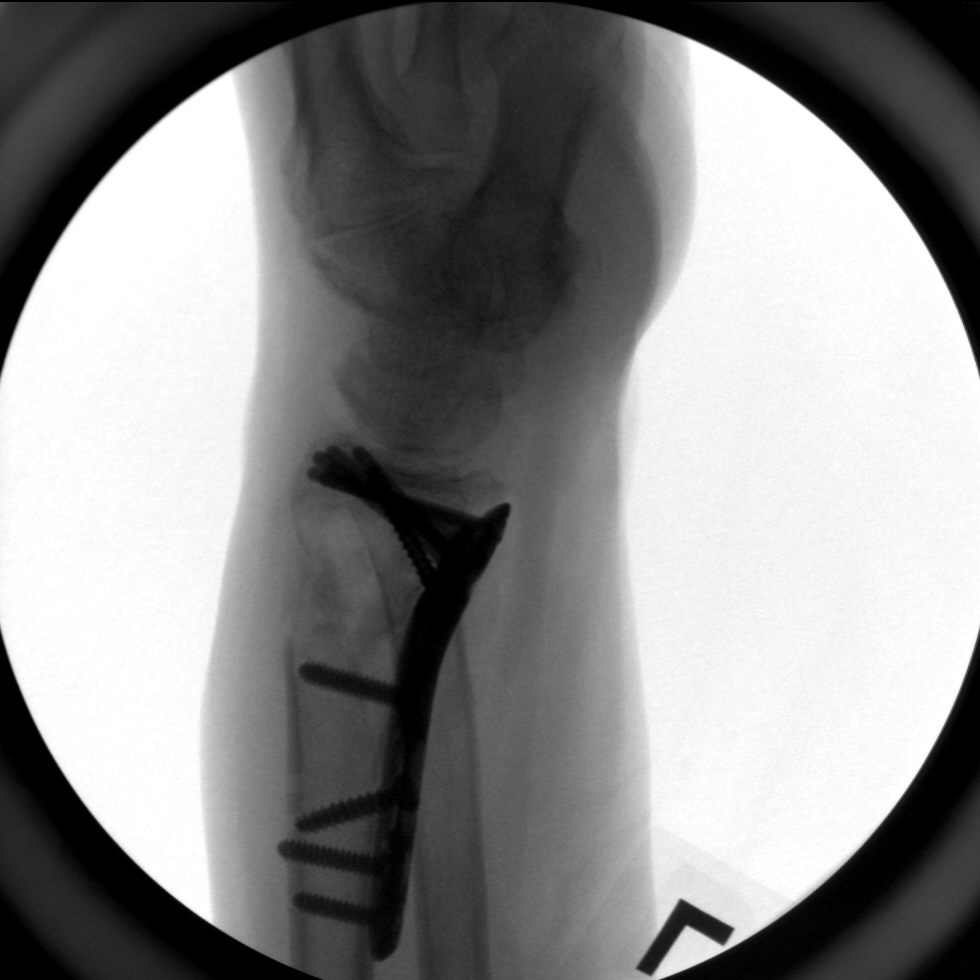

[3 of 3 positions shown; findings below may reference images not displayed]

FINDINGS: 3 C-arm fluoroscopic images were obtained intraoperatively and
submitted for post operative interpretation. These images
demonstrate open reduction internal fixation of a comminuted distal
radial fracture. Please see the performing provider's procedural
report for further detail.
IMPRESSION: Intraoperative fluoroscopic imaging, as above.

## 2022-01-30 DIAGNOSIS — L820 Inflamed seborrheic keratosis: Secondary | ICD-10-CM | POA: Diagnosis not present

## 2022-03-21 ENCOUNTER — Ambulatory Visit (INDEPENDENT_AMBULATORY_CARE_PROVIDER_SITE_OTHER): Payer: BC Managed Care – PPO | Admitting: Family Medicine

## 2022-03-21 VITALS — BP 124/70 | HR 88 | Temp 97.8°F | Ht 68.0 in | Wt 168.0 lb

## 2022-03-21 DIAGNOSIS — I10 Essential (primary) hypertension: Secondary | ICD-10-CM

## 2022-03-21 DIAGNOSIS — J301 Allergic rhinitis due to pollen: Secondary | ICD-10-CM | POA: Diagnosis not present

## 2022-03-21 DIAGNOSIS — E782 Mixed hyperlipidemia: Secondary | ICD-10-CM | POA: Diagnosis not present

## 2022-03-21 NOTE — Progress Notes (Signed)
?[facility] ?[facility] ?[address] ?[place] [address] ?Dept: [phone] ?Dept Fax: [phone] ? ?Chronic Care Office Visit ? ?Subjective:  ? ? Patient ID: [name] [name], female    DOB: [birth year], 66 y.o..   MRN: [id number] ? ?Chief Complaint  ?Patient presents with  ? Follow-up  ?  3 month f/u. No concerns.  Fasting today.    ? ? ?History of Present Illness: ? ?Patient is in today for reassessment of chronic medical issues. ? ?[name] [name] has a history of hypertension. She is managed on lisinopril. ?  ?[name] [name] has a history of perennial allergic rhinitis. She uses fluticasone nasal spray. She ahs noted some mild increase in sinus congestion and rhinorrhea lately. ?  ?[name] [name] has history of fibromyalgia. She continues to have diffuse muscle aches that she deals with. She anticipates that her physical activity will increase as the Spring progresses and she can get back to using her pool. ?  ?[name] [name] has a history of hyperlipidemia. She is currently managed on Zetia. She has been reluctant to take a statin due to concerns over it causing muscle soreness, which she feels she struggles with due to her fibromyalgia. ? ?Past Medical History: ?Patient Active Problem List  ? Diagnosis Date Noted  ? Hyperlipidemia 12/25/2021  ? Scoliosis deformity of spine 04/24/2021  ? Degeneration of spine 04/24/2021  ? Anxiety 04/24/2021  ? Osteoporosis 09/02/2019  ? Adjustment disorder with mixed anxiety and depressed mood 07/04/2015  ? Allergic rhinitis 07/04/2015  ? Essential hypertension 03/24/2015  ? Tobacco abuse 09/01/2012  ? Fibromyalgia   ? 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 ?Past Surgical History:  ?Procedure Laterality Date  ? [place]  ? RETAINED [place]  ? jaw tumor Right 2020  ? right lower jaw tumor removed 2020 by Dr. Loyal Gambler  ? LAPAROSCOPIC ASSISTED VAGINAL HYSTERECTOMY  01-25-2003  ? LAPAROSCOPY N/A  04/18/2014  ? Procedure: LAPAROSCOPY DIAGNOSTIC;  Surgeon: Margarette Asal, MD;  Location[facility];  Service: Gynecology;  Laterality: N/A;  ? NASAL SEPTUM SURGERY  AGE 89  ? OPEN REDUCTION INTERNAL FIXATION (ORIF) DISTAL RADIAL FRACTURE Left 09/18/2020  ? Procedure: OPEN TREATMENT OF LEFT DISTAL RADIUS FRACTURE;  Surgeon: Milly Jakob, MD;  Location: [facility];  Service: Orthopedics;  Laterality: Left;  LENGTH OF SURGERY: 75 MIN  ?PRE OP BLOCK  ? SALPINGOOPHORECTOMY Bilateral 04/18/2014  ? Procedure: SALPINGO OOPHORECTOMY;  Surgeon: Margarette Asal, MD;  Location: [facility];  Service: Gynecology;  Laterality: Bilateral;  ? TONSILLECTOMY  AGE 10  ? TUBAL LIGATION    ? ?Family History  ?Problem Relation Age of Onset  ? Diabetes Mother   ? Heart disease Mother   ? Cancer Father   ?     Lung  ? Diabetes Sister   ? Diabetes Sister   ? Colon cancer Neg Hx   ? Stomach cancer Neg Hx   ? ?Outpatient Medications Prior to Visit  ?Medication Sig Dispense Refill  ? Cholecalciferol (VITAMIN D3) 2000 UNITS TABS Take 1 capsule by mouth daily.    ? ezetimibe (ZETIA) 10 MG tablet Take 1 tablet (10 mg total) by mouth daily. 90 tablet 3  ? fluticasone (FLONASE) 50 MCG/ACT nasal spray Place 2 sprays into both nostrils daily. 16 g 6  ? lisinopril (ZESTRIL) 5 MG tablet Take 1 tablet (5 mg total) by mouth daily. [place]  tablet 3  ? Magnesium 250 MG TABS Take 1 tablet by mouth daily.    ? Omega-3 Fatty Acids (OMEGA 3 PO) Take by mouth.    ? vitamin E 400 UNIT capsule Take 400 Units by mouth daily.     ? ?No facility-administered medications prior to visit.  ? ?Allergies  ?Allergen Reactions  ? Hydrocodone Rash  ? Demerol [Meperidine] Nausea And Vomiting  ?  severe  ? Molds & Smuts   ? Sulfa Antibiotics Swelling and Rash  ? ?   ?Objective:  ? ?Today's Vitals  ? 03/21/22 0855  ?BP: 124/70  ?Pulse: 88  ?Temp: 97.8 ?F (36.6 ?C)  ?TempSrc: Temporal  ?SpO2: 98%  ?Weight: 168 lb (76.2 kg)  ?Height:  '5\' 8"'$  (1.727 m)  ? ?Body mass index is 25.54 kg/m?.  ? ?General: Well developed, well nourished. No acute distress. ?HEENT: Normocephalic, non-traumatic. No pain on percussion over sinuses. ?Psych: Alert and oriented. Normal mood and affect. ? ?Health Maintenance Due  ?Topic Date Due  ? Zoster Vaccines- Shingrix (1 of 2) Never done  ? MAMMOGRAM  04/24/2019  ? ?Lab Results ?Last lipids ?Lab Results  ?Component Value Date  ? CHOL 200 01/01/2022  ? HDL 39.00 (L) 01/01/2022  ? LDLCALC 125 (H) 01/01/2022  ? LDLDIRECT 177.0 01/31/2021  ? TRIG 179.0 (H) 01/01/2022  ? CHOLHDL 5 01/01/2022  ?   ?Assessment & Plan:  ? ?1. Mixed hyperlipidemia ?[name] [name] LD had been at 173 8 months ago. She is now on Zetia and it is down to 125. We discussed that the goal would be to have this under 100. She remains hesitant about taking a statin, noting her husband had significant body aches with this. She would like to focus on increased physical activity for the next 3 months. If this remains high, she might consider a low-dose statin. ? ?2. Essential hypertension ?Blood pressure is at goal. Continue lisinopril 5 mg daily. ? ?3. Seasonal allergic rhinitis due to pollen ?I recommend she add an OTC antihistamine, such as Zyrtec 10 mg daily, to her Flonase for allergy control. ? ?Return in about 3 months (around 06/20/2022) for Reassessment.  ? ?Haydee Salter, MD ? ?

## 2022-03-25 ENCOUNTER — Ambulatory Visit: Payer: BC Managed Care – PPO | Admitting: Family Medicine

## 2022-06-17 ENCOUNTER — Encounter: Payer: Self-pay | Admitting: Nurse Practitioner

## 2022-06-17 ENCOUNTER — Ambulatory Visit: Payer: BC Managed Care – PPO | Admitting: Nurse Practitioner

## 2022-06-17 VITALS — BP 152/80 | HR 56 | Temp 96.3°F | Wt 170.0 lb

## 2022-06-17 DIAGNOSIS — I10 Essential (primary) hypertension: Secondary | ICD-10-CM | POA: Diagnosis not present

## 2022-06-17 DIAGNOSIS — H6690 Otitis media, unspecified, unspecified ear: Secondary | ICD-10-CM

## 2022-06-17 MED ORDER — FLUCONAZOLE 150 MG PO TABS: 150.0000 mg | ORAL_TABLET | Freq: Once | ORAL | 0 refills | Status: AC

## 2022-06-17 MED ORDER — AMOXICILLIN-POT CLAVULANATE 875-125 MG PO TABS: 1.0000 | ORAL_TABLET | Freq: Two times a day (BID) | ORAL | 0 refills | Status: DC

## 2022-06-17 NOTE — Progress Notes (Signed)
Acute Office Visit  Subjective:     Patient ID: [name] [name], female    DOB: 1956/03/28, 66 y.o.   MRN: [id number]  Chief Complaint  Patient presents with   Ear Pain    Pt c/o Left ear pain, and sinus pain w/ dizziness x3 days    HPI Patient is in today for left ear pain and dizziness with the room spinning for the past 5 days.   EAR PAIN  Duration: days Involved ear(s): left Severity:  moderate  Quality: dull Fever: no Otorrhea: no Upper respiratory infection symptoms: yes - sinus pressure Pruritus: yes Hearing loss: no Water immersion yes Using Q-tips: no Recurrent otitis media: no Status: stable Treatments attempted:  flonase, claritin  ROS See pertinent positives and negatives per HPI.     Objective:    BP (!) 152/80 (BP Location: Right Arm, Cuff Size: Normal)   Pulse (!) 56   Temp (!) 96.3 F (35.7 C) (Temporal)   Wt 170 lb (77.1 kg)   SpO2 96%   BMI 25.85 kg/m  BP Readings from Last 3 Encounters:  06/17/22 (!) 152/80  03/21/22 124/70  12/25/21 136/78   Physical Exam Vitals and nursing note reviewed.  Constitutional:      General: She is not in acute distress.    Appearance: Normal appearance.  HENT:     Head: Normocephalic.     Right Ear: Tympanic membrane, ear canal and external ear normal.     Left Ear: External ear normal. A middle ear effusion is present. Tympanic membrane is erythematous.     Nose:     Right Sinus: Maxillary sinus tenderness present.     Left Sinus: Maxillary sinus tenderness present.  Eyes:     Conjunctiva/sclera: Conjunctivae normal.  Cardiovascular:     Rate and Rhythm: Normal rate and regular rhythm.     Pulses: Normal pulses.     Heart sounds: Normal heart sounds.  Pulmonary:     Effort: Pulmonary effort is normal.     Breath sounds: Normal breath sounds.  Musculoskeletal:     Cervical back: Normal range of motion.  Skin:    General: Skin is warm.  Neurological:     General: No focal deficit present.      Mental Status: She is alert and oriented to person, place, and time.  Psychiatric:        Mood and Affect: Mood normal.        Behavior: Behavior normal.        Thought Content: Thought content normal.        Judgment: Judgment normal.      Assessment & Plan:   Problem List Items Addressed This Visit       Cardiovascular and Mediastinum   Essential hypertension - Primary    BP slightly elevated today at 152/80. She states she has had some stressors recently and her husband has a scan to follow-up on cancer. Encouraged her to start checking blood pressure at home and keep next appointment with PCP.       Other Visit Diagnoses     Acute otitis media, unspecified otitis media type       Left ear pain x5 days. With sinus tenderness and congestion, will treat with augmentin BID x 10 days. Continue flonase and zyrtec. F/U if not improving   Relevant Medications   amoxicillin-clavulanate (AUGMENTIN) 875-125 MG tablet   fluconazole (DIFLUCAN) 150 MG tablet       Meds ordered  this encounter  Medications   amoxicillin-clavulanate (AUGMENTIN) 875-125 MG tablet    Sig: Take 1 tablet by mouth 2 (two) times daily.    Dispense:  20 tablet    Refill:  0   fluconazole (DIFLUCAN) 150 MG tablet    Sig: Take 1 tablet (150 mg total) by mouth once for 1 dose.    Dispense:  1 tablet    Refill:  0    Return if symptoms worsen or fail to improve.  Charyl Dancer, NP

## 2022-06-17 NOTE — Patient Instructions (Signed)
It was great to see you!  Start augmentin antibiotic twice a day for 10 days with food. You can ear plugs or a swim cap to help keep water out of your ears in the pool.   Let's follow-up if symptoms worsen or with any concerns.   Take care,  Vance Peper, NP

## 2022-06-17 NOTE — Assessment & Plan Note (Signed)
BP slightly elevated today at 152/80. She states she has had some stressors recently and her husband has a scan to follow-up on cancer. Encouraged her to start checking blood pressure at home and keep next appointment with PCP.

## 2022-06-21 ENCOUNTER — Telehealth: Payer: Self-pay | Admitting: Family Medicine

## 2022-06-21 NOTE — Telephone Encounter (Signed)
Pt is having diarrhea from the augmentin she is taking she wants to know should she continue taking.

## 2022-06-21 NOTE — Telephone Encounter (Signed)
Patient notified VIA phone.  No questions.  Dm/cma ? ?

## 2022-06-21 NOTE — Telephone Encounter (Signed)
Please review and advise. Thanks. Dm/cma  

## 2022-06-24 ENCOUNTER — Ambulatory Visit: Payer: BC Managed Care – PPO | Admitting: Family Medicine

## 2022-07-10 ENCOUNTER — Other Ambulatory Visit: Payer: BC Managed Care – PPO

## 2022-07-12 ENCOUNTER — Ambulatory Visit: Payer: BC Managed Care – PPO | Admitting: Family Medicine

## 2022-07-12 ENCOUNTER — Encounter: Payer: Self-pay | Admitting: Family Medicine

## 2022-07-12 VITALS — BP 132/76 | HR 63 | Temp 97.0°F | Ht 68.0 in | Wt 166.0 lb

## 2022-07-12 DIAGNOSIS — I10 Essential (primary) hypertension: Secondary | ICD-10-CM | POA: Diagnosis not present

## 2022-07-12 DIAGNOSIS — R519 Headache, unspecified: Secondary | ICD-10-CM | POA: Diagnosis not present

## 2022-07-12 DIAGNOSIS — E782 Mixed hyperlipidemia: Secondary | ICD-10-CM | POA: Diagnosis not present

## 2022-07-12 MED ORDER — LISINOPRIL 5 MG PO TABS: 5.0000 mg | ORAL_TABLET | Freq: Every day | ORAL | 3 refills | Status: DC

## 2022-07-12 NOTE — Progress Notes (Signed)
[facility] [address] [place] [address] Dept: [phone] Dept Fax: [phone]  Chronic Care Office Visit  Subjective:    Patient ID: [name] [name], female    DOB: 1956/10/06, 66 y.o..   MRN: [id number]  Chief Complaint  Patient presents with   Follow-up    3 month f/u. C/o having a few migraines (4 in a week)    History of Present Illness:  Patient is in today for reassessment of chronic medical issues.  Ms. [name] [name] has a history of hypertension. She is managed on lisinopril 5 mg daily.    Ms. [name] [name] has a history of hyperlipidemia. She is currently managed on Zetia. She has been reluctant to take a statin due to concerns over it causing muscle soreness, which she feels she struggles with due to her fibromyalgia. She is considering taking a Co-Q 10 supplement.  Ms. [name] [name] notes that she has had 4 right temporal headaches in the past month. She notes this is associated with seeing squiggly lines in front of her vision.  She plans to go see an eye doctor to have this assessed, but in unsure of where [place]. She wonders if the headaches might be due to migraines. She usually responds tot he headache by going in a darkened room and trying to get some sleep.  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 Loyal Gambler   LAPAROSCOPIC  ASSISTED VAGINAL HYSTERECTOMY  01-25-2003   LAPAROSCOPY N/A 04/18/2014   Procedure: LAPAROSCOPY DIAGNOSTIC;  Surgeon: Margarette Asal, MD;  Location: [facility];  Service: Gynecology;  Laterality: N/A;   NASAL SEPTUM SURGERY  AGE 34   OPEN REDUCTION INTERNAL FIXATION (ORIF) DISTAL RADIAL FRACTURE Left 09/18/2020   Procedure: OPEN TREATMENT OF LEFT DISTAL RADIUS FRACTURE;  Surgeon: Milly Jakob, MD;  Location: [facility];  Service: Orthopedics;  Laterality: Left;  LENGTH OF SURGERY: 75 MIN  PRE OP BLOCK   SALPINGOOPHORECTOMY Bilateral 04/18/2014   Procedure: SALPINGO OOPHORECTOMY;  Surgeon: Margarette Asal, MD;  Location: [facility];  Service: Gynecology;  Laterality: Bilateral;   TONSILLECTOMY  AGE 74   TUBAL LIGATION     Family History  Problem Relation Age of Onset   Diabetes Mother    Heart disease Mother    Cancer Father        Lung   Diabetes Sister    Diabetes Sister    Colon cancer Neg Hx    Stomach cancer Neg Hx     Outpatient Medications Prior to Visit  Medication Sig Dispense Refill   Cholecalciferol (VITAMIN D3) 2000 UNITS TABS Take 1 capsule by mouth daily.     ezetimibe (ZETIA) 10 MG tablet Take 1 tablet (10 mg total) by mouth daily. 90 tablet 3   fluticasone (FLONASE) 50 MCG/ACT nasal spray Place 2 sprays  into both nostrils daily. 16 g 6   Magnesium 250 MG TABS Take 1 tablet by mouth daily.     Omega-3 Fatty Acids (OMEGA 3 PO) Take by mouth.     vitamin E 400 UNIT capsule Take 400 Units by mouth daily.      lisinopril (ZESTRIL) 5 MG tablet Take 1 tablet (5 mg total) by mouth daily. 90 tablet 3   amoxicillin-clavulanate (AUGMENTIN) 875-125 MG tablet Take 1 tablet by mouth 2 (two) times daily. 20 tablet 0   No facility-administered medications prior to visit.   Allergies  Allergen Reactions   Hydrocodone Rash   Demerol [Meperidine] Nausea And Vomiting    severe   Molds & Smuts    Sulfa Antibiotics Swelling and  Rash     Objective:   Today's Vitals   07/12/22 1343  BP: 132/76  Pulse: 63  Temp: (!) 97 F (36.1 C)  TempSrc: Temporal  SpO2: 98%  Weight: 166 lb (75.3 kg)  Height: '5\' 8"'$  (1.727 m)   Body mass index is 25.24 kg/m.   General: Well developed, well nourished. No acute distress. Ear: Left EAC and TM are normal. Psych: Alert and oriented. Normal mood and affect.  Health Maintenance Due  Topic Date Due   Zoster Vaccines- Shingrix (1 of 2) Never done   MAMMOGRAM  04/24/2019     Assessment & Plan:   1. Essential hypertension Blood pressure is back more towards Ms. Maino's baseline today. She is due for her annual check on her renal function.  - lisinopril (ZESTRIL) 5 MG tablet; Take 1 tablet (5 mg total) by mouth daily.  Dispense: 90 tablet; Refill: 3 - Basic metabolic panel; Future  2. Mixed hyperlipidemia On Zetia 5 mg daily. Has been intolerant of statins in the past. We will reassess her fasting lipids.  - Lipid panel; Future  3. Nonintractable headache, unspecified chronicity pattern, unspecified headache type These could be migraines. Since she is able to get them to resolve without medication, I think it appropriate to observe for now. I will provide contact information for [facility] for her to follow up about the visual changes.   Return in about 3 months (around 10/12/2022) for Reassessment.   Haydee Salter, MD

## 2022-07-12 NOTE — Patient Instructions (Signed)
[facility] [phone]

## 2022-07-15 ENCOUNTER — Other Ambulatory Visit (INDEPENDENT_AMBULATORY_CARE_PROVIDER_SITE_OTHER): Payer: BC Managed Care – PPO

## 2022-07-15 DIAGNOSIS — E782 Mixed hyperlipidemia: Secondary | ICD-10-CM | POA: Diagnosis not present

## 2022-07-15 DIAGNOSIS — I10 Essential (primary) hypertension: Secondary | ICD-10-CM

## 2022-07-15 LAB — LIPID PANEL
Cholesterol: 203 mg/dL — ABNORMAL HIGH (ref 0–200)
HDL: 39.8 mg/dL (ref >39.00)
LDL Cholesterol: 139 mg/dL — ABNORMAL HIGH (ref 0–99)
NonHDL: 163.46
Total CHOL/HDL Ratio: 5
Triglycerides: 123 mg/dL (ref 0.0–149.0)
VLDL: 24.6 mg/dL (ref 0.0–40.0)

## 2022-07-15 LAB — BASIC METABOLIC PANEL
BUN: 18 mg/dL (ref 6–23)
CO2: 27 mEq/L (ref 19–32)
Calcium: 9.9 mg/dL (ref 8.4–10.5)
Chloride: 107 mEq/L (ref 96–112)
Creatinine, Ser: 0.74 mg/dL (ref 0.40–1.20)
GFR: 85.43 mL/min (ref >60.00)
Glucose, Bld: 104 mg/dL — ABNORMAL HIGH (ref 70–99)
Potassium: 5.1 mEq/L (ref 3.5–5.1)
Sodium: 143 mEq/L (ref 135–145)

## 2022-07-16 NOTE — Addendum Note (Signed)
Addended by: Haydee Salter on: 07/16/2022 12:54 PM   Modules accepted: Orders

## 2022-08-05 ENCOUNTER — Encounter: Payer: Self-pay | Admitting: Internal Medicine

## 2022-08-28 DIAGNOSIS — Z1151 Encounter for screening for human papillomavirus (HPV): Secondary | ICD-10-CM | POA: Diagnosis not present

## 2022-08-28 DIAGNOSIS — Z01419 Encounter for gynecological examination (general) (routine) without abnormal findings: Secondary | ICD-10-CM | POA: Diagnosis not present

## 2022-08-28 DIAGNOSIS — Z6825 Body mass index (BMI) 25.0-25.9, adult: Secondary | ICD-10-CM | POA: Diagnosis not present

## 2022-08-28 DIAGNOSIS — Z124 Encounter for screening for malignant neoplasm of cervix: Secondary | ICD-10-CM | POA: Diagnosis not present

## 2022-08-28 DIAGNOSIS — Z1231 Encounter for screening mammogram for malignant neoplasm of breast: Secondary | ICD-10-CM | POA: Diagnosis not present

## 2022-08-28 LAB — HM MAMMOGRAPHY

## 2022-08-28 LAB — RESULTS CONSOLE HPV: CHL HPV: NEGATIVE

## 2022-08-28 LAB — HM PAP SMEAR

## 2022-09-30 ENCOUNTER — Telehealth: Payer: Self-pay | Admitting: Family Medicine

## 2022-09-30 NOTE — Telephone Encounter (Signed)
Pt is wanting a call back concerning an ultrasound of her right cafe she had done through our office around 05/2019. Pt's cb # [phone]

## 2022-09-30 NOTE — Telephone Encounter (Signed)
Spoke to patient and advise that didn't see a [place] in the chart. Will need an appointment to evaluate the leg pain in order for her to go to Ortho.  She will call us back to schedule at a later date.  Dm/cma

## 2022-09-30 NOTE — Telephone Encounter (Signed)
Lft VM to rtn call. Dm/cma  

## 2022-10-07 ENCOUNTER — Encounter: Payer: Self-pay | Admitting: Family Medicine

## 2022-10-07 ENCOUNTER — Ambulatory Visit (INDEPENDENT_AMBULATORY_CARE_PROVIDER_SITE_OTHER): Payer: BC Managed Care – PPO | Admitting: Family Medicine

## 2022-10-07 VITALS — BP 118/76 | HR 67 | Temp 98.5°F | Wt 167.6 lb

## 2022-10-07 DIAGNOSIS — F17200 Nicotine dependence, unspecified, uncomplicated: Secondary | ICD-10-CM | POA: Diagnosis not present

## 2022-10-07 DIAGNOSIS — R0981 Nasal congestion: Secondary | ICD-10-CM | POA: Diagnosis not present

## 2022-10-07 DIAGNOSIS — R059 Cough, unspecified: Secondary | ICD-10-CM

## 2022-10-07 LAB — POCT INFLUENZA A/B
Influenza A, POC: NEGATIVE
Influenza B, POC: NEGATIVE

## 2022-10-07 LAB — POC COVID19 BINAXNOW: SARS Coronavirus 2 Ag: NEGATIVE

## 2022-10-07 MED ORDER — BENZONATATE 100 MG PO CAPS: 200.0000 mg | ORAL_CAPSULE | Freq: Three times a day (TID) | ORAL | 0 refills | Status: DC | PRN

## 2022-10-07 MED ORDER — IPRATROPIUM BROMIDE 0.06 % NA SOLN: 2.0000 | Freq: Four times a day (QID) | NASAL | 12 refills | Status: DC

## 2022-10-07 NOTE — Progress Notes (Signed)
Assessment/Plan:   Problem List Items Addressed This Visit   None Visit Diagnoses     Cough, unspecified type    -  Primary   Relevant Orders   POC COVID-19 (Completed)   POCT Influenza A/B (Completed)   Smoker       Nasal congestion       Relevant Medications   ipratropium (ATROVENT) 0.06 % nasal spray   benzonatate (TESSALON PERLES) 100 MG capsule      Recommend cessation of smoking next Night symptoms are likely related to viral URI, will treat symptomatically with ipratropium and benzo tonight Other OTC remedies as required Return precautions discussed follow-up as needed     Subjective:  HPI:  [name] [name] is a 66 y.o. female who has Tobacco abuse; Fibromyalgia; Essential hypertension; Adjustment disorder with mixed anxiety and depressed mood; Allergic rhinitis; Non-ossified fibroma of bone; Osteoporosis; Scoliosis deformity of spine; Degeneration of spine; Anxiety; and Hyperlipidemia on their problem list..   She  has a past medical history of Anxiety, Arthritis, Bone tumor (benign), Borderline hypertension, Female pelvic pain, Fibromyalgia, Left knee injury, and PONV (postoperative nausea and vomiting)..   She presents with chief complaint of Sinusitis (Sinus congestion, pressure, cough, yellow mucus x 7 days. Patient has questions regarding taking Collagen MSM with her current medications. ) .   Patient   Upper respiratory symptoms She complains of congestion, facial pain, sneezing, sore throat, and cough .with no fever, chills, night sweats or weight loss. Onset of symptoms was  7 days ago and gradually worsening.She is drinking plenty of fluids.  Past history is significant for no history of pneumonia or bronchitis. Patient is smoker.   She has been using flonase and claritin, with some improvement. ---------------------------------------------------------------------------------------------------   Past Surgical History:  Procedure Laterality Date    DILATION AND CURETTAGE OF [place]   jaw tumor Right 2020   right lower jaw tumor removed 2020 by Dr. Loyal Gambler   LAPAROSCOPIC ASSISTED VAGINAL HYSTERECTOMY  01-25-2003   LAPAROSCOPY N/A 04/18/2014   Procedure: LAPAROSCOPY DIAGNOSTIC;  Surgeon: Margarette Asal, MD;  Location: [facility];  Service: Gynecology;  Laterality: N/A;   NASAL SEPTUM SURGERY  AGE 76   OPEN REDUCTION INTERNAL FIXATION (ORIF) DISTAL RADIAL FRACTURE Left 09/18/2020   Procedure: OPEN TREATMENT OF LEFT DISTAL RADIUS FRACTURE;  Surgeon: Milly Jakob, MD;  Location: [facility];  Service: Orthopedics;  Laterality: Left;  LENGTH OF SURGERY: 75 MIN  PRE OP BLOCK   SALPINGOOPHORECTOMY Bilateral 04/18/2014   Procedure: SALPINGO OOPHORECTOMY;  Surgeon: Margarette Asal, MD;  Location: [facility];  Service: Gynecology;  Laterality: Bilateral;   TONSILLECTOMY  AGE 39   TUBAL LIGATION      Outpatient Medications Prior to Visit  Medication Sig Dispense Refill   Cholecalciferol (VITAMIN D3) 2000 UNITS TABS Take 1 capsule by mouth daily.     ezetimibe (ZETIA) 10 MG tablet Take 1 tablet (10 mg total) by mouth daily. 90 tablet 3   fluticasone (FLONASE) 50 MCG/ACT nasal spray Place 2 sprays into both nostrils daily. 16 g 6   lisinopril (ZESTRIL) 5 MG tablet Take 1 tablet (5 mg total) by mouth daily. 90 tablet 3   Magnesium 250 MG TABS Take 1 tablet by mouth daily.     vitamin E 400 UNIT capsule Take 400 Units by mouth daily.      Omega-3 Fatty Acids (OMEGA 3 PO)  Take by mouth. (Patient not taking: Reported on 10/07/2022)     No facility-administered medications prior to visit.    Family History  Problem Relation Age of Onset   Diabetes Mother    Heart disease Mother    Cancer Father        Lung   Diabetes Sister    Diabetes Sister    Colon cancer Neg Hx    Stomach cancer Neg Hx     Social History   Socioeconomic History   Marital status:  Married    Spouse name: Not on file   Number of children: 2   Years of education: Not on file   Highest education level: High school graduate  Occupational History   Occupation: Retired  Tobacco Use   Smoking status: Every Day    Packs/day: 0.00    Years: 42.00    Total pack years: 0.00    Types: Cigarettes    Start date: 1979   Smokeless tobacco: Never   Tobacco comments:    Currently smokes about 1 cigarette a day.  Vaping Use   Vaping Use: Never used  Substance and Sexual Activity   Alcohol use: Yes    Comment: rare   Drug use: No   Sexual activity: Yes  Other Topics Concern   Not on file  Social History Narrative   Not on file   Social Determinants of Health   Financial Resource Strain: Not on file  Food Insecurity: Not on file  Transportation Needs: Not on file  Physical Activity: Not on file  Stress: Not on file  Social Connections: Not on file  Intimate Partner Violence: Not on file                                                                                                 Objective:  Physical Exam: BP 118/76 (BP Location: Right Arm, Patient Position: Sitting, Cuff Size: Large)   Pulse 67   Temp 98.5 F (36.9 C) (Oral)   Wt 167 lb 9.6 oz (76 kg)   SpO2 98%   BMI 25.48 kg/m    General: No acute distress. Awake and conversant.  Eyes: Normal conjunctiva, anicteric. Round symmetric pupils.  ENT: Hearing grossly intact. No nasal discharge.  Bilateral tympanic membranes are normal, no oropharyngeal lesions, MMM Neck: Neck is supple. No masses or thyromegaly.  Respiratory: Respirations are non-labored. No auditory wheezing.  CTA B Skin: Warm. No rashes or ulcers.  Psych: Alert and oriented. Cooperative, Appropriate mood and affect, Normal judgment.  CV: No cyanosis or JVD, RRR, no MRG MSK: Normal ambulation. No clubbing  Neuro: Sensation and CN II-XII grossly normal.   Results for orders placed or performed in visit on 10/07/22 (from the past 24  hour(s))  POC COVID-19     Status: None   Collection Time: 10/07/22 11:46 AM  Result Value Ref Range   SARS Coronavirus 2 Ag Negative Negative  POCT Influenza A/B     Status: None   Collection Time: 10/07/22 11:46 AM  Result Value Ref Range   Influenza A, POC Negative Negative  Influenza B, POC Negative Negative         Alesia Banda, MD, MS

## 2022-10-10 ENCOUNTER — Telehealth: Payer: Self-pay | Admitting: Family Medicine

## 2022-10-10 NOTE — Telephone Encounter (Signed)
Caller Name: Montia Haslip Call back phone #: [phone]  Reason for Call: Pt was seen Monday 10/23 by Dr. Grandville Silos. She is doing worse. Her face hurts, her mucus is green, and she has not been able to leave her house since appointment. She would like a call back with recommendations

## 2022-10-10 NOTE — Telephone Encounter (Signed)
Advised patient of annotation below and she declined appointment. Advised her if symptoms worsen to proceed to ED.

## 2022-10-15 ENCOUNTER — Telehealth: Payer: Self-pay | Admitting: Family Medicine

## 2022-10-15 DIAGNOSIS — E785 Hyperlipidemia, unspecified: Secondary | ICD-10-CM

## 2022-10-15 NOTE — Telephone Encounter (Signed)
Caller Name: pt Call back phone #: [phone]   MEDICATION(S):  lisinopril (ZESTRIL) 5 MG tablet [[id number]]  ezetimibe (ZETIA) 10 MG tablet [[id number]]   Days of Med Remaining: enough for tomorrow 11/1 Has the patient contacted their pharmacy (YES/NO)? Yes contact your PCP   Preferred Pharmacy: [facility] [address]  [place], [place] [address]  Phone:  [phone]  Fax:  [phone]

## 2022-10-16 ENCOUNTER — Encounter: Payer: Self-pay | Admitting: Internal Medicine

## 2022-10-16 MED ORDER — EZETIMIBE 10 MG PO TABS: 10.0000 mg | ORAL_TABLET | Freq: Every day | ORAL | 3 refills | Status: DC

## 2022-10-16 NOTE — Telephone Encounter (Signed)
RX sent to the pharmacy and left detailed VM regarding that and if any problems to call us back.  Dm/cma

## 2022-12-23 ENCOUNTER — Telehealth: Payer: Self-pay | Admitting: Family Medicine

## 2022-12-23 NOTE — Telephone Encounter (Signed)
Spoke to patient and she was transferred back to front to schedule a physical.  Dm/cma

## 2022-12-23 NOTE — Telephone Encounter (Signed)
Lft VM to rtn call. Dm/cma  

## 2022-12-23 NOTE — Telephone Encounter (Signed)
Pt want to know when should make a cholesterol and cpe appointment. Please call the pt

## 2022-12-24 DIAGNOSIS — M545 Low back pain, unspecified: Secondary | ICD-10-CM | POA: Diagnosis not present

## 2022-12-24 DIAGNOSIS — M25551 Pain in right hip: Secondary | ICD-10-CM | POA: Diagnosis not present

## 2022-12-24 DIAGNOSIS — M898X5 Other specified disorders of bone, thigh: Secondary | ICD-10-CM | POA: Diagnosis not present

## 2023-01-06 DIAGNOSIS — M9902 Segmental and somatic dysfunction of thoracic region: Secondary | ICD-10-CM | POA: Diagnosis not present

## 2023-01-06 DIAGNOSIS — M5137 Other intervertebral disc degeneration, lumbosacral region: Secondary | ICD-10-CM | POA: Diagnosis not present

## 2023-01-06 DIAGNOSIS — M9901 Segmental and somatic dysfunction of cervical region: Secondary | ICD-10-CM | POA: Diagnosis not present

## 2023-01-06 DIAGNOSIS — M47812 Spondylosis without myelopathy or radiculopathy, cervical region: Secondary | ICD-10-CM | POA: Diagnosis not present

## 2023-01-06 DIAGNOSIS — M546 Pain in thoracic spine: Secondary | ICD-10-CM | POA: Diagnosis not present

## 2023-01-06 DIAGNOSIS — M9908 Segmental and somatic dysfunction of rib cage: Secondary | ICD-10-CM | POA: Diagnosis not present

## 2023-01-06 DIAGNOSIS — M5416 Radiculopathy, lumbar region: Secondary | ICD-10-CM | POA: Diagnosis not present

## 2023-01-06 DIAGNOSIS — R0782 Intercostal pain: Secondary | ICD-10-CM | POA: Diagnosis not present

## 2023-01-06 DIAGNOSIS — M9904 Segmental and somatic dysfunction of sacral region: Secondary | ICD-10-CM | POA: Diagnosis not present

## 2023-01-06 DIAGNOSIS — M9903 Segmental and somatic dysfunction of lumbar region: Secondary | ICD-10-CM | POA: Diagnosis not present

## 2023-01-06 DIAGNOSIS — M4727 Other spondylosis with radiculopathy, lumbosacral region: Secondary | ICD-10-CM | POA: Diagnosis not present

## 2023-01-08 DIAGNOSIS — M4727 Other spondylosis with radiculopathy, lumbosacral region: Secondary | ICD-10-CM | POA: Diagnosis not present

## 2023-01-09 DIAGNOSIS — M9904 Segmental and somatic dysfunction of sacral region: Secondary | ICD-10-CM | POA: Diagnosis not present

## 2023-01-09 DIAGNOSIS — M47812 Spondylosis without myelopathy or radiculopathy, cervical region: Secondary | ICD-10-CM | POA: Diagnosis not present

## 2023-01-09 DIAGNOSIS — R0782 Intercostal pain: Secondary | ICD-10-CM | POA: Diagnosis not present

## 2023-01-09 DIAGNOSIS — M9903 Segmental and somatic dysfunction of lumbar region: Secondary | ICD-10-CM | POA: Diagnosis not present

## 2023-01-09 DIAGNOSIS — M9908 Segmental and somatic dysfunction of rib cage: Secondary | ICD-10-CM | POA: Diagnosis not present

## 2023-01-09 DIAGNOSIS — M5137 Other intervertebral disc degeneration, lumbosacral region: Secondary | ICD-10-CM | POA: Diagnosis not present

## 2023-01-09 DIAGNOSIS — M5416 Radiculopathy, lumbar region: Secondary | ICD-10-CM | POA: Diagnosis not present

## 2023-01-09 DIAGNOSIS — M546 Pain in thoracic spine: Secondary | ICD-10-CM | POA: Diagnosis not present

## 2023-01-09 DIAGNOSIS — M9901 Segmental and somatic dysfunction of cervical region: Secondary | ICD-10-CM | POA: Diagnosis not present

## 2023-01-09 DIAGNOSIS — M9902 Segmental and somatic dysfunction of thoracic region: Secondary | ICD-10-CM | POA: Diagnosis not present

## 2023-01-14 DIAGNOSIS — M4727 Other spondylosis with radiculopathy, lumbosacral region: Secondary | ICD-10-CM | POA: Diagnosis not present

## 2023-01-16 DIAGNOSIS — R0782 Intercostal pain: Secondary | ICD-10-CM | POA: Diagnosis not present

## 2023-01-16 DIAGNOSIS — M9904 Segmental and somatic dysfunction of sacral region: Secondary | ICD-10-CM | POA: Diagnosis not present

## 2023-01-16 DIAGNOSIS — M9901 Segmental and somatic dysfunction of cervical region: Secondary | ICD-10-CM | POA: Diagnosis not present

## 2023-01-16 DIAGNOSIS — M9902 Segmental and somatic dysfunction of thoracic region: Secondary | ICD-10-CM | POA: Diagnosis not present

## 2023-01-16 DIAGNOSIS — M5137 Other intervertebral disc degeneration, lumbosacral region: Secondary | ICD-10-CM | POA: Diagnosis not present

## 2023-01-16 DIAGNOSIS — M9908 Segmental and somatic dysfunction of rib cage: Secondary | ICD-10-CM | POA: Diagnosis not present

## 2023-01-16 DIAGNOSIS — M9903 Segmental and somatic dysfunction of lumbar region: Secondary | ICD-10-CM | POA: Diagnosis not present

## 2023-01-16 DIAGNOSIS — M546 Pain in thoracic spine: Secondary | ICD-10-CM | POA: Diagnosis not present

## 2023-01-16 DIAGNOSIS — M47812 Spondylosis without myelopathy or radiculopathy, cervical region: Secondary | ICD-10-CM | POA: Diagnosis not present

## 2023-01-16 DIAGNOSIS — M5416 Radiculopathy, lumbar region: Secondary | ICD-10-CM | POA: Diagnosis not present

## 2023-01-16 DIAGNOSIS — M4727 Other spondylosis with radiculopathy, lumbosacral region: Secondary | ICD-10-CM | POA: Diagnosis not present

## 2023-01-20 ENCOUNTER — Telehealth: Payer: Self-pay | Admitting: Family Medicine

## 2023-01-20 DIAGNOSIS — M47812 Spondylosis without myelopathy or radiculopathy, cervical region: Secondary | ICD-10-CM | POA: Diagnosis not present

## 2023-01-20 DIAGNOSIS — M5416 Radiculopathy, lumbar region: Secondary | ICD-10-CM | POA: Diagnosis not present

## 2023-01-20 DIAGNOSIS — M5137 Other intervertebral disc degeneration, lumbosacral region: Secondary | ICD-10-CM | POA: Diagnosis not present

## 2023-01-20 DIAGNOSIS — R0782 Intercostal pain: Secondary | ICD-10-CM | POA: Diagnosis not present

## 2023-01-20 DIAGNOSIS — E785 Hyperlipidemia, unspecified: Secondary | ICD-10-CM

## 2023-01-20 DIAGNOSIS — M9901 Segmental and somatic dysfunction of cervical region: Secondary | ICD-10-CM | POA: Diagnosis not present

## 2023-01-20 DIAGNOSIS — I10 Essential (primary) hypertension: Secondary | ICD-10-CM

## 2023-01-20 DIAGNOSIS — M9904 Segmental and somatic dysfunction of sacral region: Secondary | ICD-10-CM | POA: Diagnosis not present

## 2023-01-20 DIAGNOSIS — M546 Pain in thoracic spine: Secondary | ICD-10-CM | POA: Diagnosis not present

## 2023-01-20 DIAGNOSIS — M9902 Segmental and somatic dysfunction of thoracic region: Secondary | ICD-10-CM | POA: Diagnosis not present

## 2023-01-20 DIAGNOSIS — M9903 Segmental and somatic dysfunction of lumbar region: Secondary | ICD-10-CM | POA: Diagnosis not present

## 2023-01-20 DIAGNOSIS — M9908 Segmental and somatic dysfunction of rib cage: Secondary | ICD-10-CM | POA: Diagnosis not present

## 2023-01-20 NOTE — Telephone Encounter (Signed)
MEDICATION: lisinopril (ZESTRIL) 5 MG tablet  ezetimibe (ZETIA) 10 MG tablet  PHARMACY:[id number] [place], [place] - [address]   Comments: Patient has new [facility]  **Let patient know to contact pharmacy at the end of the day to make sure medication is ready. **  ** Please notify patient to allow 48-72 hours to process**  **Encourage patient to contact the pharmacy for refills or [facility]

## 2023-01-21 MED ORDER — EZETIMIBE 10 MG PO TABS: 10.0000 mg | ORAL_TABLET | Freq: Every day | ORAL | 1 refills | Status: DC

## 2023-01-21 MED ORDER — LISINOPRIL 5 MG PO TABS
5.0000 mg | ORAL_TABLET | Freq: Every day | ORAL | 1 refills | Status: DC
Start: 2023-01-21 — End: 2023-07-30

## 2023-01-21 NOTE — Telephone Encounter (Signed)
Left VM to rtn call.  She can have those RX's transfer by [facility]. Dm/cma

## 2023-01-21 NOTE — Addendum Note (Signed)
Addended by: Konrad Saha on: 01/21/2023 01:42 PM   Modules accepted: Orders

## 2023-01-22 DIAGNOSIS — M4727 Other spondylosis with radiculopathy, lumbosacral region: Secondary | ICD-10-CM | POA: Diagnosis not present

## 2023-01-23 DIAGNOSIS — M25551 Pain in right hip: Secondary | ICD-10-CM | POA: Diagnosis not present

## 2023-01-23 DIAGNOSIS — M47812 Spondylosis without myelopathy or radiculopathy, cervical region: Secondary | ICD-10-CM | POA: Diagnosis not present

## 2023-01-23 DIAGNOSIS — M9904 Segmental and somatic dysfunction of sacral region: Secondary | ICD-10-CM | POA: Diagnosis not present

## 2023-01-23 DIAGNOSIS — M5416 Radiculopathy, lumbar region: Secondary | ICD-10-CM | POA: Diagnosis not present

## 2023-01-23 DIAGNOSIS — M9902 Segmental and somatic dysfunction of thoracic region: Secondary | ICD-10-CM | POA: Diagnosis not present

## 2023-01-23 DIAGNOSIS — R0782 Intercostal pain: Secondary | ICD-10-CM | POA: Diagnosis not present

## 2023-01-23 DIAGNOSIS — M9903 Segmental and somatic dysfunction of lumbar region: Secondary | ICD-10-CM | POA: Diagnosis not present

## 2023-01-23 DIAGNOSIS — M546 Pain in thoracic spine: Secondary | ICD-10-CM | POA: Diagnosis not present

## 2023-01-23 DIAGNOSIS — M9908 Segmental and somatic dysfunction of rib cage: Secondary | ICD-10-CM | POA: Diagnosis not present

## 2023-01-23 DIAGNOSIS — M9901 Segmental and somatic dysfunction of cervical region: Secondary | ICD-10-CM | POA: Diagnosis not present

## 2023-01-23 DIAGNOSIS — M5137 Other intervertebral disc degeneration, lumbosacral region: Secondary | ICD-10-CM | POA: Diagnosis not present

## 2023-01-27 DIAGNOSIS — M5416 Radiculopathy, lumbar region: Secondary | ICD-10-CM | POA: Diagnosis not present

## 2023-01-27 DIAGNOSIS — M9904 Segmental and somatic dysfunction of sacral region: Secondary | ICD-10-CM | POA: Diagnosis not present

## 2023-01-27 DIAGNOSIS — M9902 Segmental and somatic dysfunction of thoracic region: Secondary | ICD-10-CM | POA: Diagnosis not present

## 2023-01-27 DIAGNOSIS — M9903 Segmental and somatic dysfunction of lumbar region: Secondary | ICD-10-CM | POA: Diagnosis not present

## 2023-01-27 DIAGNOSIS — M9908 Segmental and somatic dysfunction of rib cage: Secondary | ICD-10-CM | POA: Diagnosis not present

## 2023-01-27 DIAGNOSIS — M546 Pain in thoracic spine: Secondary | ICD-10-CM | POA: Diagnosis not present

## 2023-01-27 DIAGNOSIS — M5137 Other intervertebral disc degeneration, lumbosacral region: Secondary | ICD-10-CM | POA: Diagnosis not present

## 2023-01-27 DIAGNOSIS — R0782 Intercostal pain: Secondary | ICD-10-CM | POA: Diagnosis not present

## 2023-01-27 DIAGNOSIS — M9901 Segmental and somatic dysfunction of cervical region: Secondary | ICD-10-CM | POA: Diagnosis not present

## 2023-01-27 DIAGNOSIS — M47812 Spondylosis without myelopathy or radiculopathy, cervical region: Secondary | ICD-10-CM | POA: Diagnosis not present

## 2023-01-29 DIAGNOSIS — M4727 Other spondylosis with radiculopathy, lumbosacral region: Secondary | ICD-10-CM | POA: Diagnosis not present

## 2023-01-30 ENCOUNTER — Ambulatory Visit: Payer: Medicare HMO

## 2023-01-30 ENCOUNTER — Ambulatory Visit: Payer: Medicare HMO | Admitting: Family Medicine

## 2023-01-30 ENCOUNTER — Telehealth: Payer: Self-pay | Admitting: Family Medicine

## 2023-01-30 DIAGNOSIS — M5137 Other intervertebral disc degeneration, lumbosacral region: Secondary | ICD-10-CM | POA: Diagnosis not present

## 2023-01-30 DIAGNOSIS — M9908 Segmental and somatic dysfunction of rib cage: Secondary | ICD-10-CM | POA: Diagnosis not present

## 2023-01-30 DIAGNOSIS — M546 Pain in thoracic spine: Secondary | ICD-10-CM | POA: Diagnosis not present

## 2023-01-30 DIAGNOSIS — M5416 Radiculopathy, lumbar region: Secondary | ICD-10-CM | POA: Diagnosis not present

## 2023-01-30 DIAGNOSIS — E785 Hyperlipidemia, unspecified: Secondary | ICD-10-CM

## 2023-01-30 DIAGNOSIS — M9903 Segmental and somatic dysfunction of lumbar region: Secondary | ICD-10-CM | POA: Diagnosis not present

## 2023-01-30 DIAGNOSIS — R0782 Intercostal pain: Secondary | ICD-10-CM | POA: Diagnosis not present

## 2023-01-30 DIAGNOSIS — Z0000 Encounter for general adult medical examination without abnormal findings: Secondary | ICD-10-CM

## 2023-01-30 DIAGNOSIS — M9902 Segmental and somatic dysfunction of thoracic region: Secondary | ICD-10-CM | POA: Diagnosis not present

## 2023-01-30 DIAGNOSIS — M9901 Segmental and somatic dysfunction of cervical region: Secondary | ICD-10-CM | POA: Diagnosis not present

## 2023-01-30 DIAGNOSIS — I10 Essential (primary) hypertension: Secondary | ICD-10-CM

## 2023-01-30 DIAGNOSIS — R519 Headache, unspecified: Secondary | ICD-10-CM

## 2023-01-30 DIAGNOSIS — M9904 Segmental and somatic dysfunction of sacral region: Secondary | ICD-10-CM | POA: Diagnosis not present

## 2023-01-30 DIAGNOSIS — M47812 Spondylosis without myelopathy or radiculopathy, cervical region: Secondary | ICD-10-CM | POA: Diagnosis not present

## 2023-01-30 NOTE — Telephone Encounter (Signed)
Dr Gena Fray was not able to do this appointment in the timeframe that was given. We tried to call her, she did not pick up. She will call back to reschedule.

## 2023-01-31 NOTE — Telephone Encounter (Signed)
Please call pt and attempt to RS for 40 min nurse visit prior to 40 min provider Welcome to Medicare visit

## 2023-02-03 DIAGNOSIS — M5137 Other intervertebral disc degeneration, lumbosacral region: Secondary | ICD-10-CM | POA: Diagnosis not present

## 2023-02-03 DIAGNOSIS — M546 Pain in thoracic spine: Secondary | ICD-10-CM | POA: Diagnosis not present

## 2023-02-03 DIAGNOSIS — M9901 Segmental and somatic dysfunction of cervical region: Secondary | ICD-10-CM | POA: Diagnosis not present

## 2023-02-03 DIAGNOSIS — M9904 Segmental and somatic dysfunction of sacral region: Secondary | ICD-10-CM | POA: Diagnosis not present

## 2023-02-03 DIAGNOSIS — M9902 Segmental and somatic dysfunction of thoracic region: Secondary | ICD-10-CM | POA: Diagnosis not present

## 2023-02-03 DIAGNOSIS — M9903 Segmental and somatic dysfunction of lumbar region: Secondary | ICD-10-CM | POA: Diagnosis not present

## 2023-02-03 DIAGNOSIS — M47812 Spondylosis without myelopathy or radiculopathy, cervical region: Secondary | ICD-10-CM | POA: Diagnosis not present

## 2023-02-03 DIAGNOSIS — M5416 Radiculopathy, lumbar region: Secondary | ICD-10-CM | POA: Diagnosis not present

## 2023-02-03 DIAGNOSIS — R0782 Intercostal pain: Secondary | ICD-10-CM | POA: Diagnosis not present

## 2023-02-03 DIAGNOSIS — M9908 Segmental and somatic dysfunction of rib cage: Secondary | ICD-10-CM | POA: Diagnosis not present

## 2023-02-05 DIAGNOSIS — M4727 Other spondylosis with radiculopathy, lumbosacral region: Secondary | ICD-10-CM | POA: Diagnosis not present

## 2023-02-08 DIAGNOSIS — M545 Low back pain, unspecified: Secondary | ICD-10-CM | POA: Diagnosis not present

## 2023-02-10 DIAGNOSIS — M9904 Segmental and somatic dysfunction of sacral region: Secondary | ICD-10-CM | POA: Diagnosis not present

## 2023-02-10 DIAGNOSIS — M9903 Segmental and somatic dysfunction of lumbar region: Secondary | ICD-10-CM | POA: Diagnosis not present

## 2023-02-10 DIAGNOSIS — M9901 Segmental and somatic dysfunction of cervical region: Secondary | ICD-10-CM | POA: Diagnosis not present

## 2023-02-10 DIAGNOSIS — M5416 Radiculopathy, lumbar region: Secondary | ICD-10-CM | POA: Diagnosis not present

## 2023-02-10 DIAGNOSIS — M546 Pain in thoracic spine: Secondary | ICD-10-CM | POA: Diagnosis not present

## 2023-02-10 DIAGNOSIS — M47812 Spondylosis without myelopathy or radiculopathy, cervical region: Secondary | ICD-10-CM | POA: Diagnosis not present

## 2023-02-10 DIAGNOSIS — M9908 Segmental and somatic dysfunction of rib cage: Secondary | ICD-10-CM | POA: Diagnosis not present

## 2023-02-10 DIAGNOSIS — M9902 Segmental and somatic dysfunction of thoracic region: Secondary | ICD-10-CM | POA: Diagnosis not present

## 2023-02-10 DIAGNOSIS — M5137 Other intervertebral disc degeneration, lumbosacral region: Secondary | ICD-10-CM | POA: Diagnosis not present

## 2023-02-10 DIAGNOSIS — R0782 Intercostal pain: Secondary | ICD-10-CM | POA: Diagnosis not present

## 2023-02-12 DIAGNOSIS — M4727 Other spondylosis with radiculopathy, lumbosacral region: Secondary | ICD-10-CM | POA: Diagnosis not present

## 2023-02-13 DIAGNOSIS — R0782 Intercostal pain: Secondary | ICD-10-CM | POA: Diagnosis not present

## 2023-02-13 DIAGNOSIS — M546 Pain in thoracic spine: Secondary | ICD-10-CM | POA: Diagnosis not present

## 2023-02-13 DIAGNOSIS — M9901 Segmental and somatic dysfunction of cervical region: Secondary | ICD-10-CM | POA: Diagnosis not present

## 2023-02-13 DIAGNOSIS — M9908 Segmental and somatic dysfunction of rib cage: Secondary | ICD-10-CM | POA: Diagnosis not present

## 2023-02-13 DIAGNOSIS — M5137 Other intervertebral disc degeneration, lumbosacral region: Secondary | ICD-10-CM | POA: Diagnosis not present

## 2023-02-13 DIAGNOSIS — M5416 Radiculopathy, lumbar region: Secondary | ICD-10-CM | POA: Diagnosis not present

## 2023-02-13 DIAGNOSIS — M9904 Segmental and somatic dysfunction of sacral region: Secondary | ICD-10-CM | POA: Diagnosis not present

## 2023-02-13 DIAGNOSIS — M9903 Segmental and somatic dysfunction of lumbar region: Secondary | ICD-10-CM | POA: Diagnosis not present

## 2023-02-13 DIAGNOSIS — M47812 Spondylosis without myelopathy or radiculopathy, cervical region: Secondary | ICD-10-CM | POA: Diagnosis not present

## 2023-02-13 DIAGNOSIS — M9902 Segmental and somatic dysfunction of thoracic region: Secondary | ICD-10-CM | POA: Diagnosis not present

## 2023-02-19 DIAGNOSIS — M4727 Other spondylosis with radiculopathy, lumbosacral region: Secondary | ICD-10-CM | POA: Diagnosis not present

## 2023-02-20 DIAGNOSIS — R0782 Intercostal pain: Secondary | ICD-10-CM | POA: Diagnosis not present

## 2023-02-20 DIAGNOSIS — M546 Pain in thoracic spine: Secondary | ICD-10-CM | POA: Diagnosis not present

## 2023-02-20 DIAGNOSIS — M9902 Segmental and somatic dysfunction of thoracic region: Secondary | ICD-10-CM | POA: Diagnosis not present

## 2023-02-20 DIAGNOSIS — M5137 Other intervertebral disc degeneration, lumbosacral region: Secondary | ICD-10-CM | POA: Diagnosis not present

## 2023-02-20 DIAGNOSIS — M5416 Radiculopathy, lumbar region: Secondary | ICD-10-CM | POA: Diagnosis not present

## 2023-02-20 DIAGNOSIS — M9904 Segmental and somatic dysfunction of sacral region: Secondary | ICD-10-CM | POA: Diagnosis not present

## 2023-02-20 DIAGNOSIS — M9903 Segmental and somatic dysfunction of lumbar region: Secondary | ICD-10-CM | POA: Diagnosis not present

## 2023-02-20 DIAGNOSIS — M9908 Segmental and somatic dysfunction of rib cage: Secondary | ICD-10-CM | POA: Diagnosis not present

## 2023-02-20 DIAGNOSIS — M9901 Segmental and somatic dysfunction of cervical region: Secondary | ICD-10-CM | POA: Diagnosis not present

## 2023-02-20 DIAGNOSIS — M47812 Spondylosis without myelopathy or radiculopathy, cervical region: Secondary | ICD-10-CM | POA: Diagnosis not present

## 2023-02-25 DIAGNOSIS — M9902 Segmental and somatic dysfunction of thoracic region: Secondary | ICD-10-CM | POA: Diagnosis not present

## 2023-02-25 DIAGNOSIS — M9904 Segmental and somatic dysfunction of sacral region: Secondary | ICD-10-CM | POA: Diagnosis not present

## 2023-02-25 DIAGNOSIS — M5137 Other intervertebral disc degeneration, lumbosacral region: Secondary | ICD-10-CM | POA: Diagnosis not present

## 2023-02-25 DIAGNOSIS — M5416 Radiculopathy, lumbar region: Secondary | ICD-10-CM | POA: Diagnosis not present

## 2023-02-25 DIAGNOSIS — M9903 Segmental and somatic dysfunction of lumbar region: Secondary | ICD-10-CM | POA: Diagnosis not present

## 2023-02-25 DIAGNOSIS — R0782 Intercostal pain: Secondary | ICD-10-CM | POA: Diagnosis not present

## 2023-02-25 DIAGNOSIS — M546 Pain in thoracic spine: Secondary | ICD-10-CM | POA: Diagnosis not present

## 2023-02-25 DIAGNOSIS — M9908 Segmental and somatic dysfunction of rib cage: Secondary | ICD-10-CM | POA: Diagnosis not present

## 2023-02-25 DIAGNOSIS — M9901 Segmental and somatic dysfunction of cervical region: Secondary | ICD-10-CM | POA: Diagnosis not present

## 2023-02-25 DIAGNOSIS — M47812 Spondylosis without myelopathy or radiculopathy, cervical region: Secondary | ICD-10-CM | POA: Diagnosis not present

## 2023-02-25 DIAGNOSIS — M4727 Other spondylosis with radiculopathy, lumbosacral region: Secondary | ICD-10-CM | POA: Diagnosis not present

## 2023-02-26 ENCOUNTER — Ambulatory Visit (INDEPENDENT_AMBULATORY_CARE_PROVIDER_SITE_OTHER): Payer: Medicare HMO | Admitting: Family Medicine

## 2023-02-26 ENCOUNTER — Encounter: Payer: Self-pay | Admitting: Family Medicine

## 2023-02-26 ENCOUNTER — Ambulatory Visit: Payer: Medicare HMO

## 2023-02-26 VITALS — BP 124/60 | HR 53 | Temp 97.7°F | Ht 68.0 in | Wt 169.2 lb

## 2023-02-26 DIAGNOSIS — Z1211 Encounter for screening for malignant neoplasm of colon: Secondary | ICD-10-CM

## 2023-02-26 DIAGNOSIS — M48061 Spinal stenosis, lumbar region without neurogenic claudication: Secondary | ICD-10-CM

## 2023-02-26 DIAGNOSIS — M797 Fibromyalgia: Secondary | ICD-10-CM | POA: Diagnosis not present

## 2023-02-26 DIAGNOSIS — Z136 Encounter for screening for cardiovascular disorders: Secondary | ICD-10-CM

## 2023-02-26 DIAGNOSIS — Z0000 Encounter for general adult medical examination without abnormal findings: Secondary | ICD-10-CM

## 2023-02-26 HISTORY — DX: Spinal stenosis, lumbar region without neurogenic claudication: M48.061

## 2023-02-26 MED ORDER — CYCLOBENZAPRINE HCL 10 MG PO TABS: 10.0000 mg | ORAL_TABLET | Freq: Two times a day (BID) | ORAL | 0 refills | Status: DC

## 2023-02-26 NOTE — Patient Instructions (Addendum)
Schedule an eye exam with your eye doctor. Consider establishing an Advanced Directive. Continue to work at smoking cessation. Consider having a pneumococcal vaccine. Consider having shingles vaccination. Referral placed for colonoscopy

## 2023-02-26 NOTE — Progress Notes (Unsigned)
[facility] [address] [place] [address] Dept: [phone] Dept Fax: [phone]  Welcome to Medicare Visit  Subjective:    Patient ID: [name] [name], female    DOB: Dec 03, 1956, 67 y.o..   MRN: [id number]   Chief Complaint  Patient presents with   Medicare Wellness    Welcome to medicare.     Patient presents today for their Welcome to Medicare Exam   Preventive Screening-Counseling & Management  Vision screen: 20/20 both eyes  Advanced Directives: ms. Kellas does not have one currently.  Modifiable Risk Factors/behavioral risk assessment/psychosocial risk assessment  Regular exercise: Does regular swimming in the summer months. Admits to less exercise in the winter limited by her low back pain issues.  Diet: Has reduced red meat consumption. Avoids eating out.  Wt Readings from Last 3 Encounters:  02/26/23 169 lb 3.2 oz (76.7 kg)  10/07/22 167 lb 9.6 oz (76 kg)  07/12/22 166 lb (75.3 kg)   Smoking Status: Current occasional cigarette. Using 7 mg/d patch. Has ~ 17 pack-year history. Second Hand Smoking status: Husband smokes outside of home Alcohol intake: 0 per week  Cardiac risk factors: Advanced age (older than 61 for men, 79 for women) Hyperlipidemia- on Zetia 10 mg dialy and omega 3 fatty acids Hypertension- on lisinopril 5 mg daily No diabetes.  Lab Results  Component Value Date   HGBA1C 5.9 07/02/2021   Family History: Family History  Problem Relation Age of Onset   Diabetes Mother    Heart disease Mother    Cancer Father        Lung   Diabetes Sister    Diabetes Sister    Colon cancer Neg Hx    Stomach cancer Neg Hx    Depression Screen/risk evaluation Risk factors: No suicidality. PHQ2 2    02/26/2023    2:57 PM 03/21/2022    8:55 AM 01/31/2021    9:35 AM 09/02/2019    8:24 AM 11/19/2017    9:26 AM  Depression screen PHQ 2/9  Decreased Interest 0 0 0 0 0  Down, Depressed,  Hopeless 1 0 1 0 0  PHQ - 2 Score 1 0 1 0 0   Functional ability and level of safety Mobility assessment: Timed get up and go 8 seconds Activities of Daily Living- Independent in ADLs (toileting, bathing, dressing, transferring, eating) and in IADLs (shopping, housekeeping, managing own medications, and handling finances) Home Safety: No loose rugs, smoke detectors, stairs in home, but well lighted, no hearing difficulties: Fall Risk: low      02/26/2023    2:57 PM 12/11/2021    3:08 PM 09/02/2019    8:20 AM 11/19/2017    9:26 AM  Fall Risk   Falls in the past year? 0 0 0 No  Number falls in past yr: 0 0    Injury with Fall? 0 0    Risk for fall due to : No Fall Risks      Opioid use history: No long term opioids use   Self assessment of health status: Fair  Required Immunizations needed today:   Immunization History  Administered Date(s) Administered   Influenza,inj,Quad PF,6+ Mos 11/19/2017, 10/01/2018   Moderna Sars-Covid-2 Vaccination 07/17/2020, 08/24/2020   Tdap 11/19/2017    Health Maintenance  Topic Date Due   Pneumonia Vaccine (1 of 2 - PCV) Never done   Zoster (Shingles) Vaccine (1 of 2) Never done   Mammogram  04/24/2019  Colon Cancer Screening  10/14/2022   COVID-19 Vaccine (3 - 2023-24 season) 03/14/2023*   Flu Shot  03/16/2023*   Pap Smear  07/14/2023   Medicare Annual Wellness Visit  02/26/2024   DTaP/Tdap/Td vaccine (2 - Td or Tdap) 11/20/2027   DEXA scan (bone density measurement)  Completed   Hepatitis C Screening: USPSTF Recommendation to screen - Ages 101-79 yo.  Completed   HIV Screening  Completed   HPV Vaccine  Aged Out  *Topic was postponed. The date shown is not the original due date.   Screening tests- Health Maintenance Due  Topic Date Due   Pneumonia Vaccine 29+ Years old (1 of 2 - PCV) Never done   Zoster Vaccines- Shingrix (1 of 2) Never done   MAMMOGRAM  04/24/2019   COLONOSCOPY (Pts 45-79yr Insurance coverage will need to be  confirmed)  10/14/2022   1.      Colon cancer screening- Last colonoscopy 2013 2.      Lung Cancer screening- Does not meet criteria. 3.      Cervical cancer screening- Last pap in July 2021 4.      Breast cancer screening- Reports mammogram at GYN office in 08/2022  The following were reviewed and entered/updated in epic: Past Medical History:  Diagnosis Date   Anxiety    Arthritis    HANDS,  KNEES   Bone tumor (benign)    RIGHT FEMUR   Borderline hypertension    Female pelvic pain    Fibromyalgia    Left knee injury    TENDONDINITIS/  CHONDRAMALIA   PONV (postoperative nausea and vomiting)    Patient Active Problem List   Diagnosis Date Noted   Hyperlipidemia 12/25/2021   Scoliosis deformity of spine 04/24/2021   Degeneration of spine 04/24/2021   Anxiety 04/24/2021   Osteoporosis 09/02/2019   Adjustment disorder with mixed anxiety and depressed mood 07/04/2015   Allergic rhinitis 07/04/2015   Essential hypertension 03/24/2015   Tobacco abuse 09/01/2012   Fibromyalgia    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   Past Surgical History:  Procedure Laterality Date   DILATION AND CURETTAGE OF [place]  jaw tumor Right 2020   right lower jaw tumor removed 2020 by Dr. MLoyal Gambler  LAPAROSCOPIC ASSISTED VAGINAL HYSTERECTOMY  01-25-2003   LAPAROSCOPY N/A 04/18/2014   Procedure: LAPAROSCOPY DIAGNOSTIC;  Surgeon: RMargarette Asal MD;  [facility]  Service: Gynecology;  Laterality: N/A;   NASAL SEPTUM SURGERY  AGE 22   OPEN REDUCTION INTERNAL FIXATION (ORIF) DISTAL RADIAL FRACTURE Left 09/18/2020   Procedure: OPEN TREATMENT OF LEFT DISTAL RADIUS FRACTURE;  Surgeon: TMilly Jakob MD;  [place] [facility]  Service: Orthopedics;  Laterality: Left;  LENGTH OF SURGERY: 75 MIN  PRE OP BLOCK   SALPINGOOPHORECTOMY Bilateral 04/18/2014   Procedure: SALPINGO OOPHORECTOMY;  Surgeon: RMargarette Asal MD;  [facility]  Service: Gynecology;  Laterality: Bilateral;   TONSILLECTOMY  AGE 89  TUBAL LIGATION     Family History  Problem Relation Age of Onset   Diabetes Mother    Heart disease Mother    Cancer Father        Lung   Diabetes Sister    Diabetes Sister    Colon cancer Neg Hx    Stomach cancer Neg Hx    Medications- reviewed and updated Current Outpatient Medications  Medication Sig Dispense Refill  Cholecalciferol (VITAMIN D3) 2000 UNITS TABS Take 1 capsule by mouth daily.     ezetimibe (ZETIA) 10 MG tablet Take 1 tablet (10 mg total) by mouth daily. 90 tablet 1   fluticasone (FLONASE) 50 MCG/ACT nasal spray Place 2 sprays into both nostrils daily. 16 g 6   ipratropium (ATROVENT) 0.06 % nasal spray Place 2 sprays into both nostrils 4 (four) times daily. 15 mL 12   lisinopril (ZESTRIL) 5 MG tablet Take 1 tablet (5 mg total) by mouth daily. 90 tablet 1   Magnesium 250 MG TABS Take 1 tablet by mouth daily.     nicotine (NICODERM CQ - DOSED IN MG/24 HR) 7 mg/24hr patch Place 7 mg onto the skin daily.     vitamin E 400 UNIT capsule Take 400 Units by mouth daily.      Omega-3 Fatty Acids (OMEGA 3 PO) Take by mouth. (Patient not taking: Reported on 10/07/2022)     No current facility-administered medications for this visit.   Allergies-reviewed and updated Allergies  Allergen Reactions   Hydrocodone Rash   Demerol [Meperidine] Nausea And Vomiting    severe   Molds & Smuts    Sulfa Antibiotics Swelling and Rash   Social History   Socioeconomic History   Marital status: Married    Spouse name: Not on file   Number of children: 2   Years of education: Not on file   Highest education level: High school graduate  Occupational History   Occupation: Retired  Tobacco Use   Smoking status: Every Day    Packs/day: 0.25    Years: 42.00    Total pack years: 10.50    Types: Cigarettes    Start date: 1979   Smokeless tobacco: Never   Tobacco comments:     Currently smokes about 1 cigarette a day.  Vaping Use   Vaping Use: Never used  Substance and Sexual Activity   Alcohol use: Yes    Comment: rare   Drug use: No   Sexual activity: Yes  Other Topics Concern   Not on file  Social History Narrative   Not on file   Social Determinants of Health   Financial Resource Strain: Low Risk  (02/26/2023)   Overall Financial Resource Strain (CARDIA)    Difficulty of Paying Living Expenses: Not hard at all  Food Insecurity: No Food Insecurity (02/26/2023)   Hunger Vital Sign    Worried About Running Out of Food in the Last Year: Never true    Ran Out of Food in the Last Year: Never true  Transportation Needs: No Transportation Needs (02/26/2023)   PRAPARE - Hydrologist (Medical): No    Lack of Transportation (Non-Medical): No  Physical Activity: Sufficiently Active (02/26/2023)   Exercise Vital Sign    Days of Exercise per Week: 7 days    Minutes of Exercise per Session: 30 min  Stress: No Stress Concern Present (02/26/2023)   [place]    Feeling of Stress : Only a little  Social Connections: Moderately Integrated (02/26/2023)   Social Connection and Isolation Panel [NHANES]    Frequency of Communication with Friends and Family: More than three times a week    Frequency of Social Gatherings with Friends and Family: Three times a week    Attends Religious Services: More than 4 times per year    Active Member of Clubs or Organizations: No    Attends Club  or Organization Meetings: Never    Marital Status: Married   Objective:    Vitals:   02/26/23 1448  Weight: 169 lb 3.2 oz (76.7 kg)  Height: '5\' 8"'$  (1.727 m)   General: Well developed, well nourished. No acute distress. HEENT: Normocephalic, non-traumatic. PERRL, EOMI. Conjunctiva clear. External ears normal. EAC and   TMs normal bilaterally. Nose clear without congestion or rhinorrhea. Mucous  membranes moist.   Oropharynx clear. Good dentition. Neck: Supple. No lymphadenopathy. No thyromegaly. Lungs: Clear to auscultation bilaterally. No wheezing, rales or rhonchi. CV: RRR without murmurs or rubs. Pulses 2+ bilaterally. Abdomen: Soft, non-tender. Bowel sounds positive, normal pitch and frequency. No hepatosplenomegaly.   No rebound or guarding. Back: Straight. Mild pain on palpation over lower back. Extremities: Full ROM. No joint swelling or tenderness. No edema noted. Skin: Warm and dry. No rashes. Psych: Alert and oriented x3. Normal mood and affect.  EKG; Normal sinus rhythm.   Assessment & Plan:   Welcome to Medicare exam completed- 1.      Educated, counseled and referred based on above elements 2.      Educated, counseled and referred as appropriate for preventative needs 3.      Discussed and documented a written plan for preventiative services and screenings with personalized health advice- After Visit Summary was given to patient which included this plan: Schedule an eye exam with your eye doctor. Consider establishing an Advanced Directive. Continue to work at smoking cessation. Consider having a pneumococcal vaccine (declines today). Consider having shingles vaccination (declines today). Referral placed for colonoscopy 4. EKG offered: Normal  Recommended follow up: Future Appointments  Date Time Provider Eunice  05/29/2023  9:00 AM Drisana Schweickert, Lillette Boxer, MD [id number]    Lab/Order associations:   ICD-10-CM   1. Welcome to Medicare preventive visit  Z00.00     2. Screening for colon cancer  Z12.11 Ambulatory referral to Gastroenterology    3. Fibromyalgia  M79.7 cyclobenzaprine (FLEXERIL) 10 MG tablet     Meds ordered this encounter  Medications   cyclobenzaprine (FLEXERIL) 10 MG tablet    Sig: Take 1-2 tablets (10-20 mg total) by mouth in the morning and at bedtime.    Dispense:  30 tablet    Refill:  0   Return precautions  advised.  Haydee Salter, MD

## 2023-02-26 NOTE — Progress Notes (Signed)
Patient presented for EKG/HEARING AND VISION EXAM FOR WELCOME TO MEDICARE VISIT. SDOH completed as well.

## 2023-03-03 DIAGNOSIS — M5416 Radiculopathy, lumbar region: Secondary | ICD-10-CM | POA: Diagnosis not present

## 2023-03-05 ENCOUNTER — Telehealth: Payer: Self-pay | Admitting: Family Medicine

## 2023-03-05 DIAGNOSIS — M4727 Other spondylosis with radiculopathy, lumbosacral region: Secondary | ICD-10-CM | POA: Diagnosis not present

## 2023-03-05 NOTE — Telephone Encounter (Signed)
Caller Name: Marlow Baars Call back phone #: [phone]  Reason for Call: Pt has 2 meds lisinopril and zetia. She would like to know if she can take tumeric with and there be no adverse reaction.

## 2023-03-06 NOTE — Telephone Encounter (Signed)
Lft VM to rtn call.  .dm

## 2023-03-06 NOTE — Telephone Encounter (Signed)
Please give pt a cb concerning this issue.

## 2023-03-06 NOTE — Telephone Encounter (Signed)
Patient notified VIA phone. No further questions. Dm/cma  

## 2023-03-11 ENCOUNTER — Telehealth: Payer: Self-pay | Admitting: Family Medicine

## 2023-03-11 DIAGNOSIS — M4727 Other spondylosis with radiculopathy, lumbosacral region: Secondary | ICD-10-CM | POA: Diagnosis not present

## 2023-03-11 NOTE — Telephone Encounter (Signed)
Caller Name: Safiyah Call back phone #: [phone]  Reason for Call: Pt went to PT, her bp was up 166/82, HR 58 then 144/78 HR 55, then 138/76 HR 56. Her pt said she should notify you of this. She was unable to exercise. She is taking flexeril at night and wonders if this could cause her BP to rise and fluctuate.

## 2023-03-12 NOTE — Telephone Encounter (Signed)
Patient notified [place] phone.  She will check her BP then follow with [place] if they remain elevated. Dm/cma

## 2023-03-13 DIAGNOSIS — M4727 Other spondylosis with radiculopathy, lumbosacral region: Secondary | ICD-10-CM | POA: Diagnosis not present

## 2023-03-17 DIAGNOSIS — M9903 Segmental and somatic dysfunction of lumbar region: Secondary | ICD-10-CM | POA: Diagnosis not present

## 2023-03-17 DIAGNOSIS — M546 Pain in thoracic spine: Secondary | ICD-10-CM | POA: Diagnosis not present

## 2023-03-17 DIAGNOSIS — M5416 Radiculopathy, lumbar region: Secondary | ICD-10-CM | POA: Diagnosis not present

## 2023-03-17 DIAGNOSIS — M47812 Spondylosis without myelopathy or radiculopathy, cervical region: Secondary | ICD-10-CM | POA: Diagnosis not present

## 2023-03-17 DIAGNOSIS — M5137 Other intervertebral disc degeneration, lumbosacral region: Secondary | ICD-10-CM | POA: Diagnosis not present

## 2023-03-17 DIAGNOSIS — M9902 Segmental and somatic dysfunction of thoracic region: Secondary | ICD-10-CM | POA: Diagnosis not present

## 2023-03-17 DIAGNOSIS — M9908 Segmental and somatic dysfunction of rib cage: Secondary | ICD-10-CM | POA: Diagnosis not present

## 2023-03-17 DIAGNOSIS — M4727 Other spondylosis with radiculopathy, lumbosacral region: Secondary | ICD-10-CM | POA: Diagnosis not present

## 2023-03-17 DIAGNOSIS — M9904 Segmental and somatic dysfunction of sacral region: Secondary | ICD-10-CM | POA: Diagnosis not present

## 2023-03-17 DIAGNOSIS — R0782 Intercostal pain: Secondary | ICD-10-CM | POA: Diagnosis not present

## 2023-03-17 DIAGNOSIS — M9901 Segmental and somatic dysfunction of cervical region: Secondary | ICD-10-CM | POA: Diagnosis not present

## 2023-03-20 DIAGNOSIS — M4727 Other spondylosis with radiculopathy, lumbosacral region: Secondary | ICD-10-CM | POA: Diagnosis not present

## 2023-03-25 DIAGNOSIS — M9903 Segmental and somatic dysfunction of lumbar region: Secondary | ICD-10-CM | POA: Diagnosis not present

## 2023-03-25 DIAGNOSIS — M9902 Segmental and somatic dysfunction of thoracic region: Secondary | ICD-10-CM | POA: Diagnosis not present

## 2023-03-25 DIAGNOSIS — M9904 Segmental and somatic dysfunction of sacral region: Secondary | ICD-10-CM | POA: Diagnosis not present

## 2023-03-25 DIAGNOSIS — R0782 Intercostal pain: Secondary | ICD-10-CM | POA: Diagnosis not present

## 2023-03-25 DIAGNOSIS — M9901 Segmental and somatic dysfunction of cervical region: Secondary | ICD-10-CM | POA: Diagnosis not present

## 2023-03-25 DIAGNOSIS — M47812 Spondylosis without myelopathy or radiculopathy, cervical region: Secondary | ICD-10-CM | POA: Diagnosis not present

## 2023-03-25 DIAGNOSIS — M5416 Radiculopathy, lumbar region: Secondary | ICD-10-CM | POA: Diagnosis not present

## 2023-03-25 DIAGNOSIS — M5137 Other intervertebral disc degeneration, lumbosacral region: Secondary | ICD-10-CM | POA: Diagnosis not present

## 2023-03-25 DIAGNOSIS — M546 Pain in thoracic spine: Secondary | ICD-10-CM | POA: Diagnosis not present

## 2023-03-25 DIAGNOSIS — M9908 Segmental and somatic dysfunction of rib cage: Secondary | ICD-10-CM | POA: Diagnosis not present

## 2023-03-26 ENCOUNTER — Telehealth: Payer: Self-pay | Admitting: Family Medicine

## 2023-03-26 DIAGNOSIS — M4727 Other spondylosis with radiculopathy, lumbosacral region: Secondary | ICD-10-CM | POA: Diagnosis not present

## 2023-03-26 NOTE — Telephone Encounter (Signed)
Pt is wondering if she may need labs with her upcoming appointment in June, she also wants to discuss what labs she may need with that appt. Please advise pt at [phone] .

## 2023-03-26 NOTE — Telephone Encounter (Signed)
Patient notified VIA phone. We will do blood work the day of her appointment.  Dm/cma

## 2023-03-31 DIAGNOSIS — M4727 Other spondylosis with radiculopathy, lumbosacral region: Secondary | ICD-10-CM | POA: Diagnosis not present

## 2023-04-01 DIAGNOSIS — M9908 Segmental and somatic dysfunction of rib cage: Secondary | ICD-10-CM | POA: Diagnosis not present

## 2023-04-01 DIAGNOSIS — M9902 Segmental and somatic dysfunction of thoracic region: Secondary | ICD-10-CM | POA: Diagnosis not present

## 2023-04-01 DIAGNOSIS — R0782 Intercostal pain: Secondary | ICD-10-CM | POA: Diagnosis not present

## 2023-04-01 DIAGNOSIS — M5137 Other intervertebral disc degeneration, lumbosacral region: Secondary | ICD-10-CM | POA: Diagnosis not present

## 2023-04-01 DIAGNOSIS — M5416 Radiculopathy, lumbar region: Secondary | ICD-10-CM | POA: Diagnosis not present

## 2023-04-01 DIAGNOSIS — M9901 Segmental and somatic dysfunction of cervical region: Secondary | ICD-10-CM | POA: Diagnosis not present

## 2023-04-01 DIAGNOSIS — M47812 Spondylosis without myelopathy or radiculopathy, cervical region: Secondary | ICD-10-CM | POA: Diagnosis not present

## 2023-04-01 DIAGNOSIS — M9903 Segmental and somatic dysfunction of lumbar region: Secondary | ICD-10-CM | POA: Diagnosis not present

## 2023-04-01 DIAGNOSIS — M546 Pain in thoracic spine: Secondary | ICD-10-CM | POA: Diagnosis not present

## 2023-04-01 DIAGNOSIS — M9904 Segmental and somatic dysfunction of sacral region: Secondary | ICD-10-CM | POA: Diagnosis not present

## 2023-04-07 DIAGNOSIS — M5137 Other intervertebral disc degeneration, lumbosacral region: Secondary | ICD-10-CM | POA: Diagnosis not present

## 2023-04-07 DIAGNOSIS — M9902 Segmental and somatic dysfunction of thoracic region: Secondary | ICD-10-CM | POA: Diagnosis not present

## 2023-04-07 DIAGNOSIS — M47812 Spondylosis without myelopathy or radiculopathy, cervical region: Secondary | ICD-10-CM | POA: Diagnosis not present

## 2023-04-07 DIAGNOSIS — M9903 Segmental and somatic dysfunction of lumbar region: Secondary | ICD-10-CM | POA: Diagnosis not present

## 2023-04-07 DIAGNOSIS — M9904 Segmental and somatic dysfunction of sacral region: Secondary | ICD-10-CM | POA: Diagnosis not present

## 2023-04-07 DIAGNOSIS — R0782 Intercostal pain: Secondary | ICD-10-CM | POA: Diagnosis not present

## 2023-04-07 DIAGNOSIS — M546 Pain in thoracic spine: Secondary | ICD-10-CM | POA: Diagnosis not present

## 2023-04-07 DIAGNOSIS — M5416 Radiculopathy, lumbar region: Secondary | ICD-10-CM | POA: Diagnosis not present

## 2023-04-07 DIAGNOSIS — M9908 Segmental and somatic dysfunction of rib cage: Secondary | ICD-10-CM | POA: Diagnosis not present

## 2023-04-07 DIAGNOSIS — M9901 Segmental and somatic dysfunction of cervical region: Secondary | ICD-10-CM | POA: Diagnosis not present

## 2023-04-08 DIAGNOSIS — H524 Presbyopia: Secondary | ICD-10-CM | POA: Diagnosis not present

## 2023-04-14 DIAGNOSIS — M9903 Segmental and somatic dysfunction of lumbar region: Secondary | ICD-10-CM | POA: Diagnosis not present

## 2023-04-14 DIAGNOSIS — M9902 Segmental and somatic dysfunction of thoracic region: Secondary | ICD-10-CM | POA: Diagnosis not present

## 2023-04-14 DIAGNOSIS — M546 Pain in thoracic spine: Secondary | ICD-10-CM | POA: Diagnosis not present

## 2023-04-14 DIAGNOSIS — M5416 Radiculopathy, lumbar region: Secondary | ICD-10-CM | POA: Diagnosis not present

## 2023-04-14 DIAGNOSIS — M9904 Segmental and somatic dysfunction of sacral region: Secondary | ICD-10-CM | POA: Diagnosis not present

## 2023-04-14 DIAGNOSIS — M9908 Segmental and somatic dysfunction of rib cage: Secondary | ICD-10-CM | POA: Diagnosis not present

## 2023-04-14 DIAGNOSIS — M47812 Spondylosis without myelopathy or radiculopathy, cervical region: Secondary | ICD-10-CM | POA: Diagnosis not present

## 2023-04-14 DIAGNOSIS — M5137 Other intervertebral disc degeneration, lumbosacral region: Secondary | ICD-10-CM | POA: Diagnosis not present

## 2023-04-14 DIAGNOSIS — R0782 Intercostal pain: Secondary | ICD-10-CM | POA: Diagnosis not present

## 2023-04-14 DIAGNOSIS — M9901 Segmental and somatic dysfunction of cervical region: Secondary | ICD-10-CM | POA: Diagnosis not present

## 2023-04-17 DIAGNOSIS — M5416 Radiculopathy, lumbar region: Secondary | ICD-10-CM | POA: Diagnosis not present

## 2023-04-22 ENCOUNTER — Telehealth: Payer: Self-pay | Admitting: Family Medicine

## 2023-04-22 NOTE — Telephone Encounter (Signed)
Caller Name: Sabreen Riedesel Ph #: [phone] Chief Complaint: She had a reaction to a med and started bleeding from her rectum and it stopped was told to report it if it happened again.   This call was transferred to Nurse Triage/Access Nurse. This is for documentation purposes. No follow up required at this time.

## 2023-04-22 NOTE — Telephone Encounter (Signed)
Noted. Dm/cma  

## 2023-04-29 DIAGNOSIS — M5416 Radiculopathy, lumbar region: Secondary | ICD-10-CM | POA: Diagnosis not present

## 2023-04-29 DIAGNOSIS — M5137 Other intervertebral disc degeneration, lumbosacral region: Secondary | ICD-10-CM | POA: Diagnosis not present

## 2023-04-29 DIAGNOSIS — M47812 Spondylosis without myelopathy or radiculopathy, cervical region: Secondary | ICD-10-CM | POA: Diagnosis not present

## 2023-04-29 DIAGNOSIS — R0782 Intercostal pain: Secondary | ICD-10-CM | POA: Diagnosis not present

## 2023-04-29 DIAGNOSIS — M546 Pain in thoracic spine: Secondary | ICD-10-CM | POA: Diagnosis not present

## 2023-04-29 DIAGNOSIS — M9902 Segmental and somatic dysfunction of thoracic region: Secondary | ICD-10-CM | POA: Diagnosis not present

## 2023-04-29 DIAGNOSIS — M9904 Segmental and somatic dysfunction of sacral region: Secondary | ICD-10-CM | POA: Diagnosis not present

## 2023-04-29 DIAGNOSIS — M9908 Segmental and somatic dysfunction of rib cage: Secondary | ICD-10-CM | POA: Diagnosis not present

## 2023-04-29 DIAGNOSIS — M9903 Segmental and somatic dysfunction of lumbar region: Secondary | ICD-10-CM | POA: Diagnosis not present

## 2023-04-29 DIAGNOSIS — M9901 Segmental and somatic dysfunction of cervical region: Secondary | ICD-10-CM | POA: Diagnosis not present

## 2023-05-06 DIAGNOSIS — D485 Neoplasm of uncertain behavior of skin: Secondary | ICD-10-CM | POA: Diagnosis not present

## 2023-05-06 DIAGNOSIS — L820 Inflamed seborrheic keratosis: Secondary | ICD-10-CM | POA: Diagnosis not present

## 2023-05-06 DIAGNOSIS — L814 Other melanin hyperpigmentation: Secondary | ICD-10-CM | POA: Diagnosis not present

## 2023-05-06 DIAGNOSIS — D2239 Melanocytic nevi of other parts of face: Secondary | ICD-10-CM | POA: Diagnosis not present

## 2023-05-06 DIAGNOSIS — L821 Other seborrheic keratosis: Secondary | ICD-10-CM | POA: Diagnosis not present

## 2023-05-06 DIAGNOSIS — D225 Melanocytic nevi of trunk: Secondary | ICD-10-CM | POA: Diagnosis not present

## 2023-05-21 DIAGNOSIS — M9904 Segmental and somatic dysfunction of sacral region: Secondary | ICD-10-CM | POA: Diagnosis not present

## 2023-05-21 DIAGNOSIS — M5137 Other intervertebral disc degeneration, lumbosacral region: Secondary | ICD-10-CM | POA: Diagnosis not present

## 2023-05-21 DIAGNOSIS — M9901 Segmental and somatic dysfunction of cervical region: Secondary | ICD-10-CM | POA: Diagnosis not present

## 2023-05-21 DIAGNOSIS — M9903 Segmental and somatic dysfunction of lumbar region: Secondary | ICD-10-CM | POA: Diagnosis not present

## 2023-05-21 DIAGNOSIS — M546 Pain in thoracic spine: Secondary | ICD-10-CM | POA: Diagnosis not present

## 2023-05-21 DIAGNOSIS — M47812 Spondylosis without myelopathy or radiculopathy, cervical region: Secondary | ICD-10-CM | POA: Diagnosis not present

## 2023-05-21 DIAGNOSIS — R0782 Intercostal pain: Secondary | ICD-10-CM | POA: Diagnosis not present

## 2023-05-21 DIAGNOSIS — M9902 Segmental and somatic dysfunction of thoracic region: Secondary | ICD-10-CM | POA: Diagnosis not present

## 2023-05-21 DIAGNOSIS — M9908 Segmental and somatic dysfunction of rib cage: Secondary | ICD-10-CM | POA: Diagnosis not present

## 2023-05-21 DIAGNOSIS — M5416 Radiculopathy, lumbar region: Secondary | ICD-10-CM | POA: Diagnosis not present

## 2023-05-26 ENCOUNTER — Ambulatory Visit (INDEPENDENT_AMBULATORY_CARE_PROVIDER_SITE_OTHER): Payer: Medicare HMO | Admitting: Family Medicine

## 2023-05-26 ENCOUNTER — Telehealth: Payer: Self-pay | Admitting: Family Medicine

## 2023-05-26 ENCOUNTER — Encounter: Payer: Self-pay | Admitting: Family Medicine

## 2023-05-26 VITALS — BP 122/68 | HR 68 | Temp 98.3°F | Ht 68.0 in | Wt 168.2 lb

## 2023-05-26 DIAGNOSIS — M48061 Spinal stenosis, lumbar region without neurogenic claudication: Secondary | ICD-10-CM | POA: Diagnosis not present

## 2023-05-26 DIAGNOSIS — H9202 Otalgia, left ear: Secondary | ICD-10-CM | POA: Diagnosis not present

## 2023-05-26 DIAGNOSIS — E782 Mixed hyperlipidemia: Secondary | ICD-10-CM | POA: Diagnosis not present

## 2023-05-26 DIAGNOSIS — I10 Essential (primary) hypertension: Secondary | ICD-10-CM | POA: Diagnosis not present

## 2023-05-26 MED ORDER — TRAMADOL HCL 50 MG PO TABS: 50.0000 mg | ORAL_TABLET | Freq: Three times a day (TID) | ORAL | 0 refills | Status: AC | PRN

## 2023-05-26 NOTE — Progress Notes (Signed)
[facility] [address] [place] [address] Dept: [phone] Dept Fax: [phone]  Office Visit  Subjective:    Patient ID: [name] [name], female    DOB: 1956-11-04, 67 y.o..   MRN: [id number]  Chief Complaint  Patient presents with   Ear Injury    C/o having LT ear pain.     History of Present Illness:  Patient is in today for with a complaint of left ear discomfort. She had cleaned her ear with a cotton swab recently, she was worried she may have scratched her ear canal or packed wax into this.  [name] has a history of spinal stenosis and a lumbar radiculopathy. She has been through physical therapy, which she feels may be worsening her issues. She was recently prescribed diclofenac, but notes she had some rectal bleeding while on this. She was previously referred for a colonoscopy and notes she does plan to follow through to have this done.  [name] has a history of hypertension. She is managed on lisinopril 5 mg daily.    [name] has a history of hyperlipidemia. She is currently managed on Zetia. She has been reluctant to take a statin due to concerns over it causing muscle soreness, which she feels she struggles with due to her fibromyalgia.   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 Retta Mac   LAPAROSCOPIC ASSISTED VAGINAL HYSTERECTOMY  01-25-2003   LAPAROSCOPY N/A 04/18/2014   Procedure: LAPAROSCOPY DIAGNOSTIC;  Surgeon: Meriel Pica, MD;  Location[facility];  Service: Gynecology;  Laterality: N/A;   NASAL SEPTUM SURGERY  AGE 36   OPEN REDUCTION INTERNAL FIXATION (ORIF) DISTAL RADIAL FRACTURE Left 09/18/2020   Procedure: OPEN TREATMENT OF LEFT DISTAL RADIUS FRACTURE;  Surgeon: Mack Hook, MD;  Location: [facility];  Service: Orthopedics;  Laterality: Left;  LENGTH OF SURGERY: 75 MIN  PRE OP BLOCK   SALPINGOOPHORECTOMY Bilateral 04/18/2014   Procedure: SALPINGO OOPHORECTOMY;  Surgeon: Meriel Pica, MD;  Location: [facility];  Service: Gynecology;  Laterality: Bilateral;   TONSILLECTOMY  AGE 78   TUBAL LIGATION     Family History  Problem Relation Age of Onset   Diabetes Mother    Heart disease Mother    Cancer Father        Lung   Diabetes Sister    Diabetes Sister    Colon cancer Neg Hx    Stomach cancer Neg Hx    Outpatient Medications Prior to Visit  Medication Sig Dispense Refill   Cholecalciferol (VITAMIN D3) 2000 UNITS TABS Take 1 capsule by mouth daily.     cyclobenzaprine (FLEXERIL) 10 MG tablet Take 1-2 tablets (10-20 mg total) by mouth in the morning  and at bedtime. 30 tablet 0   ezetimibe (ZETIA) 10 MG tablet Take 1 tablet (10 mg total) by mouth daily. 90 tablet 1   fluticasone (FLONASE) 50 MCG/ACT nasal spray Place 2 sprays into both nostrils daily. 16 g 6   ipratropium (ATROVENT) 0.06 % nasal spray Place 2 sprays into both nostrils 4 (four) times daily. 15 mL 12   lisinopril (ZESTRIL) 5 MG tablet Take 1 tablet (5 mg total) by mouth daily. 90 tablet 1   Magnesium 250 MG TABS Take 1 tablet by mouth daily.     nicotine (NICODERM CQ - DOSED IN MG/24 HR) 7 mg/24hr patch Place 7 mg onto the skin daily.     Omega-3 Fatty  Acids (OMEGA 3 PO) Take by mouth. (Patient not taking: Reported on 10/07/2022)     vitamin E 400 UNIT capsule Take 400 Units by mouth daily.      No facility-administered medications prior to visit.   Allergies  Allergen Reactions   Hydrocodone Rash   Demerol [Meperidine] Nausea And Vomiting    severe   Molds & Smuts    Sulfa Antibiotics Swelling and Rash     Objective:   Today's Vitals   05/26/23 1401  BP: 122/68  Pulse: 68  Temp: 98.3 F (36.8 C)  TempSrc: Temporal  SpO2: 95%  Weight: 168 lb 3.2 oz (76.3 kg)  Height: 5\' 8"  (1.727 m)   Body mass index is 25.57 kg/m.   General: Well developed, well nourished. No acute distress. HEENT: Normocephalic, non-traumatic. External ears normal. EAC and TMs normal bilaterally.Psych: Alert and oriented. Normal mood and affect.  Health Maintenance Due  Topic Date Due   Pneumonia Vaccine 68+ Years old (1 of 2 - PCV) Never done   HIV Screening  Never done   Zoster Vaccines- Shingrix (1 of 2) Never done   MAMMOGRAM  04/24/2019   Colonoscopy  10/14/2022   PAP SMEAR-Modifier  07/14/2023     Assessment & Plan:   Problem List Items Addressed This Visit       Cardiovascular and Mediastinum   Essential hypertension    BP at goal today. Continue lisinopril 5 mg daily.        Other   Hyperlipidemia    Continue ezetimibe 10 mg daily. I will see her back in 6 weeks and plan annual lipids.      Spinal stenosis at L4-L5 level    We discussed risk and benefits of ESI, which she has been recommended to have. I agree with her stopping diclofenac. I will prescribe a short course of tramadol to help provide some comfort while she is [place] vacation at the beach this [place].      Relevant Medications   traMADol (ULTRAM) 50 MG tablet   Other Visit Diagnoses     Discomfort of left ear    -  Primary   Exam is normal. Advise against using swabs in the ar canal. We will follow this for now.       Return in about 6 weeks (around  07/07/2023) for Reassessment.   Loyola Mast, MD

## 2023-05-26 NOTE — Telephone Encounter (Signed)
PT Push q-tip in her ear to far what should she do right ear

## 2023-05-26 NOTE — Telephone Encounter (Signed)
Left detailed VM to come fasting for appointment due to being due for blood work. Dm/cma

## 2023-05-26 NOTE — Telephone Encounter (Signed)
She scheduled an appt today @ 2;00.  Dm/cma

## 2023-05-26 NOTE — Assessment & Plan Note (Signed)
We discussed risk and benefits of ESI, which she has been recommended to have. I agree with her stopping diclofenac. I will prescribe a short course of tramadol to help provide some comfort while she is on vacation at the beach this next week.

## 2023-05-26 NOTE — Telephone Encounter (Signed)
Pt want to know if she has to fast for her appt on 6.13.24

## 2023-05-26 NOTE — Assessment & Plan Note (Signed)
BP at goal today. Continue lisinopril 5 mg daily.

## 2023-05-26 NOTE — Assessment & Plan Note (Signed)
Continue ezetimibe 10 mg daily. I will see her back in 6 weeks and plan annual lipids.

## 2023-05-29 ENCOUNTER — Ambulatory Visit: Payer: Medicare HMO | Admitting: Family Medicine

## 2023-06-26 DIAGNOSIS — M5416 Radiculopathy, lumbar region: Secondary | ICD-10-CM | POA: Diagnosis not present

## 2023-06-26 DIAGNOSIS — R0782 Intercostal pain: Secondary | ICD-10-CM | POA: Diagnosis not present

## 2023-06-26 DIAGNOSIS — M9908 Segmental and somatic dysfunction of rib cage: Secondary | ICD-10-CM | POA: Diagnosis not present

## 2023-06-26 DIAGNOSIS — M47812 Spondylosis without myelopathy or radiculopathy, cervical region: Secondary | ICD-10-CM | POA: Diagnosis not present

## 2023-06-26 DIAGNOSIS — M546 Pain in thoracic spine: Secondary | ICD-10-CM | POA: Diagnosis not present

## 2023-06-26 DIAGNOSIS — M5137 Other intervertebral disc degeneration, lumbosacral region: Secondary | ICD-10-CM | POA: Diagnosis not present

## 2023-06-26 DIAGNOSIS — M9903 Segmental and somatic dysfunction of lumbar region: Secondary | ICD-10-CM | POA: Diagnosis not present

## 2023-06-26 DIAGNOSIS — M9902 Segmental and somatic dysfunction of thoracic region: Secondary | ICD-10-CM | POA: Diagnosis not present

## 2023-06-26 DIAGNOSIS — M9904 Segmental and somatic dysfunction of sacral region: Secondary | ICD-10-CM | POA: Diagnosis not present

## 2023-06-26 DIAGNOSIS — M9901 Segmental and somatic dysfunction of cervical region: Secondary | ICD-10-CM | POA: Diagnosis not present

## 2023-07-08 ENCOUNTER — Ambulatory Visit: Payer: Medicare HMO | Admitting: Family Medicine

## 2023-07-08 ENCOUNTER — Encounter: Payer: Self-pay | Admitting: Family Medicine

## 2023-07-08 VITALS — BP 120/74 | HR 57 | Temp 98.0°F | Ht 68.0 in | Wt 168.0 lb

## 2023-07-08 DIAGNOSIS — Z720 Tobacco use: Secondary | ICD-10-CM

## 2023-07-08 DIAGNOSIS — E782 Mixed hyperlipidemia: Secondary | ICD-10-CM

## 2023-07-08 DIAGNOSIS — M48061 Spinal stenosis, lumbar region without neurogenic claudication: Secondary | ICD-10-CM

## 2023-07-08 DIAGNOSIS — I10 Essential (primary) hypertension: Secondary | ICD-10-CM | POA: Diagnosis not present

## 2023-07-08 LAB — BASIC METABOLIC PANEL
BUN: 14 mg/dL (ref 6–23)
CO2: 27 mEq/L (ref 19–32)
Calcium: 9.5 mg/dL (ref 8.4–10.5)
Chloride: 106 mEq/L (ref 96–112)
Creatinine, Ser: 0.74 mg/dL (ref 0.40–1.20)
GFR: 84.84 mL/min (ref >60.00)
Glucose, Bld: 96 mg/dL (ref 70–99)
Potassium: 4.6 mEq/L (ref 3.5–5.1)
Sodium: 140 mEq/L (ref 135–145)

## 2023-07-08 LAB — LDL CHOLESTEROL, DIRECT: Direct LDL: 136 mg/dL

## 2023-07-08 LAB — LIPID PANEL
Cholesterol: 200 mg/dL (ref 0–200)
HDL: 35.9 mg/dL — ABNORMAL LOW (ref >39.00)
NonHDL: 164.03
Total CHOL/HDL Ratio: 6
Triglycerides: 229 mg/dL — ABNORMAL HIGH (ref 0.0–149.0)
VLDL: 45.8 mg/dL — ABNORMAL HIGH (ref 0.0–40.0)

## 2023-07-08 NOTE — Progress Notes (Signed)
[facility] [address] [place] [address] Dept: [phone] Dept Fax: [phone]  Chronic Care Office Visit  Subjective:    Patient ID: [name] [name], female    DOB: April 04, 1956, 67 y.o..   MRN: [id number]  Chief Complaint  Patient presents with   Medical Management of Chronic Issues    6 week f/u.  Fasting today.  Neck pain.    History of Present Illness:  Patient is in today for reassessment of chronic medical issues.  Ms. Manolis has a history of hypertension. She is managed on lisinopril 5 mg daily. She notes she has not been taking this over the past several days. She had noted some persistent dry cough that she relates to use of this medicine.  Ms. Langston has a history of smoking. She notes she is not smoking every day, with her smoking 0-4 cigarettes/day. She had been using a nicotine patch 7 mg/24hr over the past 6-7 months. She finds at times this makes her skin feel irritated. She admits to periods int he past when she was able to quit for long periods.     Ms. Nordmeyer has a history of hyperlipidemia. She is currently managed on Zetia 10 mg daily.  Ms. Varricchio has a history of severe spinal stenosis. She is considering ESI for this, but has been reluctant. she is engaged in pool exercises which do seem to help this some.  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 Retta Mac   LAPAROSCOPIC ASSISTED VAGINAL HYSTERECTOMY  01-25-2003   LAPAROSCOPY N/A 04/18/2014   Procedure: LAPAROSCOPY DIAGNOSTIC;  Surgeon: Meriel Pica, MD;  Location: [facility];  Service: Gynecology;  Laterality: N/A;   NASAL SEPTUM SURGERY  AGE 4   OPEN REDUCTION INTERNAL FIXATION (ORIF) DISTAL RADIAL FRACTURE Left 09/18/2020   Procedure: OPEN TREATMENT OF LEFT DISTAL RADIUS FRACTURE;  Surgeon: Mack Hook, MD;  Location: [facility];  Service: Orthopedics;  Laterality: Left;  LENGTH OF SURGERY: 75 MIN  PRE OP BLOCK   SALPINGOOPHORECTOMY Bilateral 04/18/2014   Procedure: SALPINGO OOPHORECTOMY;  Surgeon: Meriel Pica, MD;  Location: [facility];  Service: Gynecology;  Laterality: Bilateral;   TONSILLECTOMY  AGE 76   TUBAL LIGATION     Family History  Problem Relation Age of Onset   Diabetes Mother    Heart disease Mother    Cancer Father        Lung   Diabetes Sister    Diabetes Sister    Colon cancer Neg Hx    Stomach cancer Neg Hx    Outpatient Medications Prior to Visit  Medication Sig Dispense Refill   Cholecalciferol (VITAMIN D3) 2000 UNITS TABS Take 1 capsule by mouth daily.  cyclobenzaprine (FLEXERIL) 10 MG tablet Take 1-2 tablets (10-20 mg total) by mouth in the morning and at bedtime. 30 tablet 0   ezetimibe (ZETIA) 10 MG tablet Take 1 tablet (10 mg total) by mouth daily. 90 tablet 1   fluticasone (FLONASE) 50 MCG/ACT nasal spray Place 2 sprays into both nostrils daily. 16 g 6   ipratropium (ATROVENT) 0.06 % nasal spray Place 2 sprays into both nostrils 4 (four) times daily. 15 mL 12   lisinopril (ZESTRIL) 5 MG tablet Take 1 tablet (5 mg total) by mouth daily. 90 tablet 1   Magnesium 250 MG TABS Take 1 tablet by mouth daily.     nicotine (NICODERM CQ - DOSED IN  MG/24 HR) 7 mg/24hr patch Place 7 mg onto the skin daily.     Omega-3 Fatty Acids (OMEGA 3 PO) Take by mouth.     vitamin E 400 UNIT capsule Take 400 Units by mouth daily.      No facility-administered medications prior to visit.   Allergies  Allergen Reactions   Hydrocodone Rash   Demerol [Meperidine] Nausea And Vomiting    severe   Molds & Smuts    Sulfa Antibiotics Swelling and Rash   Objective:   Today's Vitals   07/08/23 0913  BP: 120/74  Pulse: (!) 57  Temp: 98 F (36.7 C)  TempSrc: Temporal  SpO2: 98%  Weight: 168 lb (76.2 kg)  Height: 5\' 8"  (1.727 m)   Body mass index is 25.54 kg/m.   General: Well developed, well nourished. No acute distress. Psych: Alert and oriented. Normal mood and affect.  Health Maintenance Due  Topic Date Due   Pneumonia Vaccine 77+ Years old (1 of 2 - PCV) Never done   HIV Screening  Never done   Zoster Vaccines- Shingrix (1 of 2) Never done   MAMMOGRAM  04/24/2019   Colonoscopy  10/14/2022   PAP SMEAR-Modifier  07/14/2023     Assessment & Plan:   Problem List Items Addressed This Visit       Cardiovascular and Mediastinum   Essential hypertension - Primary   Relevant Orders   Basic metabolic panel     Other   Hyperlipidemia   Relevant Orders   Lipid panel   Spinal stenosis at L4-L5 level   Tobacco abuse    Return in about 3 months (around 10/08/2023) for Reassessment.   Loyola Mast, MD

## 2023-07-08 NOTE — Assessment & Plan Note (Signed)
She can continue to use some periodic NSAIDs for now. Tramadol was not very helpful for her. She continues to consider if she should have an ESI.

## 2023-07-08 NOTE — Assessment & Plan Note (Signed)
Continue ezetimibe 10 mg daily. I will check her fasting lipids.

## 2023-07-08 NOTE — Assessment & Plan Note (Signed)
BP is normal without taking medication. As her lisinopril could be a source of cough, I recommend she stop her lisinopril for now. We will continue to monitor her blood pressure as well as for resolution of her cough.

## 2023-07-08 NOTE — Assessment & Plan Note (Addendum)
Strongly advise that she stop smoking. We discussed that her current tobacco use gives her less nicotine than the patches she is using. I recommend she quit "cold [place]" at this point. She expressed concerns for possible weight gain. We discussed the realities of this.  I spent 6 minutes counseling the patient about tobacco cessation.

## 2023-07-09 ENCOUNTER — Encounter: Payer: Self-pay | Admitting: Family Medicine

## 2023-07-15 ENCOUNTER — Telehealth: Payer: Self-pay | Admitting: Family Medicine

## 2023-07-15 NOTE — Telephone Encounter (Signed)
07/15/23 - pt called asking to know if her lab results are out. Pt wants a call back on [phone] as regards the results.

## 2023-07-15 NOTE — Telephone Encounter (Signed)
Patient notified VIA phone. Questions answered.  Dm/cma 

## 2023-07-21 DIAGNOSIS — M5137 Other intervertebral disc degeneration, lumbosacral region: Secondary | ICD-10-CM | POA: Diagnosis not present

## 2023-07-21 DIAGNOSIS — M9901 Segmental and somatic dysfunction of cervical region: Secondary | ICD-10-CM | POA: Diagnosis not present

## 2023-07-21 DIAGNOSIS — M9904 Segmental and somatic dysfunction of sacral region: Secondary | ICD-10-CM | POA: Diagnosis not present

## 2023-07-21 DIAGNOSIS — M9908 Segmental and somatic dysfunction of rib cage: Secondary | ICD-10-CM | POA: Diagnosis not present

## 2023-07-21 DIAGNOSIS — M47812 Spondylosis without myelopathy or radiculopathy, cervical region: Secondary | ICD-10-CM | POA: Diagnosis not present

## 2023-07-21 DIAGNOSIS — M5416 Radiculopathy, lumbar region: Secondary | ICD-10-CM | POA: Diagnosis not present

## 2023-07-21 DIAGNOSIS — M546 Pain in thoracic spine: Secondary | ICD-10-CM | POA: Diagnosis not present

## 2023-07-21 DIAGNOSIS — R0782 Intercostal pain: Secondary | ICD-10-CM | POA: Diagnosis not present

## 2023-07-21 DIAGNOSIS — M9903 Segmental and somatic dysfunction of lumbar region: Secondary | ICD-10-CM | POA: Diagnosis not present

## 2023-07-21 DIAGNOSIS — M9902 Segmental and somatic dysfunction of thoracic region: Secondary | ICD-10-CM | POA: Diagnosis not present

## 2023-07-30 ENCOUNTER — Other Ambulatory Visit: Payer: Self-pay | Admitting: Family Medicine

## 2023-07-30 DIAGNOSIS — E785 Hyperlipidemia, unspecified: Secondary | ICD-10-CM

## 2023-07-30 DIAGNOSIS — I10 Essential (primary) hypertension: Secondary | ICD-10-CM

## 2023-08-12 DIAGNOSIS — H524 Presbyopia: Secondary | ICD-10-CM | POA: Diagnosis not present

## 2023-09-16 ENCOUNTER — Encounter: Payer: Self-pay | Admitting: Family Medicine

## 2023-09-16 ENCOUNTER — Ambulatory Visit (INDEPENDENT_AMBULATORY_CARE_PROVIDER_SITE_OTHER): Payer: Medicare HMO | Admitting: Family Medicine

## 2023-09-16 VITALS — BP 124/70 | HR 78 | Temp 99.0°F | Ht 68.0 in | Wt 169.0 lb

## 2023-09-16 DIAGNOSIS — J189 Pneumonia, unspecified organism: Secondary | ICD-10-CM

## 2023-09-16 LAB — POC COVID19 BINAXNOW: SARS Coronavirus 2 Ag: NEGATIVE

## 2023-09-16 MED ORDER — AMOXICILLIN-POT CLAVULANATE 875-125 MG PO TABS
1.0000 | ORAL_TABLET | Freq: Two times a day (BID) | ORAL | 0 refills | Status: DC
Start: 2023-09-16 — End: 2023-10-14

## 2023-09-16 NOTE — Progress Notes (Signed)
[facility] [address] [place] [address] Dept: [phone] Dept Fax: [phone]  Office Visit  Subjective:    Patient ID: [name] [name], female    DOB: 1956/09/10, 67 y.o..   MRN: [id number]  Chief Complaint  Patient presents with   Sinus Problem    C/o having sinus, HA, cough x 4 days.   Has used Flonase and Dylsem DM.     History of Present Illness:  Patient is in today with a 4-day history of illness. This started last Friday, with sneezing and rhinorrhea. By Sat., she was having headache and cough. She notes that by yesterday, she was feelign much more sicka nd coughing more. She did pick up some cough syrup fromt he pharmacy. She has not been smoking as much the last few days.  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 Retta Mac   LAPAROSCOPIC ASSISTED VAGINAL HYSTERECTOMY  01-25-2003   LAPAROSCOPY N/A 04/18/2014   Procedure: LAPAROSCOPY DIAGNOSTIC;  Surgeon: Meriel Pica, MD;  Location: [facility];  Service: Gynecology;  Laterality: N/A;   NASAL SEPTUM SURGERY  AGE 89   OPEN REDUCTION INTERNAL FIXATION (ORIF) DISTAL RADIAL FRACTURE Left 09/18/2020   Procedure: OPEN TREATMENT OF LEFT DISTAL RADIUS FRACTURE;   Surgeon: Mack Hook, MD;  Location: [facility];  Service: Orthopedics;  Laterality: Left;  LENGTH OF SURGERY: 75 MIN  PRE OP BLOCK   SALPINGOOPHORECTOMY Bilateral 04/18/2014   Procedure: SALPINGO OOPHORECTOMY;  Surgeon: Meriel Pica, MD;  Location: [facility];  Service: Gynecology;  Laterality: Bilateral;   TONSILLECTOMY  AGE 15   TUBAL LIGATION     Family History  Problem Relation Age of Onset   Diabetes Mother    Heart disease Mother    Cancer Father        Lung   Diabetes Sister    Diabetes Sister    Colon cancer Neg Hx    Stomach cancer Neg Hx    Outpatient Medications Prior to Visit  Medication Sig Dispense Refill   Cholecalciferol (VITAMIN D3) 2000 UNITS TABS Take 1 capsule by mouth daily.     cyclobenzaprine (FLEXERIL) 10 MG tablet Take 1-2 tablets (10-20 mg total) by mouth in the morning and at bedtime. 30 tablet 0   ezetimibe (ZETIA) 10 MG tablet TAKE 1 TABLET BY MOUTH EVERY DAY 90 tablet 3   fluticasone (FLONASE) 50 MCG/ACT nasal spray Place 2 sprays into both nostrils daily. 16 g 6   ipratropium (ATROVENT) 0.06 % nasal spray Place 2 sprays into both nostrils 4 (four) times daily. 15 mL 12   lisinopril (ZESTRIL) 5 MG tablet TAKE 1 TABLET (5 MG TOTAL) BY MOUTH DAILY. 90 tablet 3   Magnesium 250  MG TABS Take 1 tablet by mouth daily.     nicotine (NICODERM CQ - DOSED IN MG/24 HR) 7 mg/24hr patch Place 7 mg onto the skin daily.     Omega-3 Fatty Acids (OMEGA 3 PO) Take by mouth.     vitamin E 400 UNIT capsule Take 400 Units by mouth daily.      No facility-administered medications prior to visit.   Allergies  Allergen Reactions   Hydrocodone Rash   Demerol [Meperidine] Nausea And Vomiting    severe   Molds & Smuts    Sulfa Antibiotics Swelling and Rash     Objective:   Today's Vitals   09/16/23 1602  BP: 124/70  Pulse: 78  Temp: 99 F (37.2 C)  TempSrc: Temporal  SpO2: 97%  Weight: 169 lb (76.7 kg)  Height: 5\' 8"  (1.727  m)   Body mass index is 25.7 kg/m.   General: Well developed, well nourished. No acute distress. HEENT: Normocephalic, non-traumatic. PERRL, EOMI. Conjunctiva clear. External ears normal. EAC and   TMs normal bilaterally. Nose clear without congestion or rhinorrhea. Mucous membranes moist. Oropharynx   clear. Good dentition. Neck: Supple. No lymphadenopathy. No thyromegaly. Lungs: There are rhonchi in the lower left lungs, which does nto clear with coughing. No wheezing or rales. CV: RRR without murmurs or rubs. Pulses 2+ bilaterally. Psych: Alert and oriented. Normal mood and affect.  Health Maintenance Due  Topic Date Due   HIV Screening  Never done   Zoster Vaccines- Shingrix (1 of 2) Never done   Colonoscopy  10/14/2022   Lab Results: POCT Covid: Neg.    Assessment & Plan:   Problem List Items Addressed This Visit   None Visit Diagnoses     Pneumonia of left lower lobe due to infectious organism    -  Primary   Clincially, Ms. Brackens has signs of pneumonia. I will treat her with a course of Augmentin. Follow-up if not improving.   Relevant Medications   amoxicillin-clavulanate (AUGMENTIN) 875-125 MG tablet   Other Relevant Orders   POC COVID-19 (Completed)       Return if symptoms worsen or fail to improve.   Loyola Mast, MD

## 2023-09-22 ENCOUNTER — Telehealth: Payer: Self-pay | Admitting: Family Medicine

## 2023-09-22 DIAGNOSIS — J189 Pneumonia, unspecified organism: Secondary | ICD-10-CM

## 2023-09-22 MED ORDER — GUAIFENESIN-CODEINE 100-10 MG/5ML PO SOLN
5.0000 mL | Freq: Three times a day (TID) | ORAL | 1 refills | Status: DC | PRN
Start: 2023-09-22 — End: 2023-11-25

## 2023-09-22 NOTE — Telephone Encounter (Signed)
Pt want you to give her a call because she has been still coughing , pt  said she been feeling better but stilll have lingering cough and would like a different cough medication

## 2023-09-22 NOTE — Telephone Encounter (Signed)
Spoke to patient , she is still having some cough, and wheezing.  Has been Tussin DM.  Is there anything else seh can take for cough.   Please review and advise.  Thanks. Dm/cma

## 2023-09-23 NOTE — Telephone Encounter (Signed)
She will pick it up at the [facility]. Dm/cma

## 2023-09-23 NOTE — Telephone Encounter (Signed)
Lft detailed VM that RX was sent to the pharmacy. Dm/cma  

## 2023-09-23 NOTE — Telephone Encounter (Signed)
Pt called to say she needs all her meds to go to   [id number] [place] [place] - [address] [place] [address] Phone: [phone]  Fax: [phone]-   She needs her guaiFENesin-codeine 100-10 MG/5ML syrup [[id number]]  [facility] She said thatnks

## 2023-09-29 DIAGNOSIS — Z1231 Encounter for screening mammogram for malignant neoplasm of breast: Secondary | ICD-10-CM | POA: Diagnosis not present

## 2023-09-29 DIAGNOSIS — Z01419 Encounter for gynecological examination (general) (routine) without abnormal findings: Secondary | ICD-10-CM | POA: Diagnosis not present

## 2023-09-29 DIAGNOSIS — Z6825 Body mass index (BMI) 25.0-25.9, adult: Secondary | ICD-10-CM | POA: Diagnosis not present

## 2023-09-29 DIAGNOSIS — M8588 Other specified disorders of bone density and structure, other site: Secondary | ICD-10-CM | POA: Diagnosis not present

## 2023-09-29 DIAGNOSIS — M810 Age-related osteoporosis without current pathological fracture: Secondary | ICD-10-CM | POA: Diagnosis not present

## 2023-09-29 LAB — HM DEXA SCAN

## 2023-10-14 NOTE — Progress Notes (Unsigned)
   Rubin Payor, PhD, LAT, ATC acting as a scribe for Clementeen Graham, MD.  ANNAMAY LANZILLO is a 67 y.o. female who presents to [facility] today for osteoporosis management. Hx of hysterectomy 2004. Fibromyalgia  DEXA scan (date, T-score): 09/29/23: Spine= -1.7, L-FN= -2.5, R-FN= -2.7 Prior treatment: *** History of Hip, Spine, or Wrist Fx: 2021 distal radius fx Heart disease or stroke: *** Cancer: no Kidney Disease: *** Gastric/Peptic Ulcer: *** Gastric bypass surgery: *** Severe GERD: *** Hx of seizures: *** Age at Menopause: *** Calcium intake: yes Vitamin D intake: yes9 Hormone replacement therapy: *** Smoking history: yes, current- 1 pack/wk, former user of e-cig Alcohol: none Exercise: moderate Major dental work in past year: *** Parents with hip/spine fracture: yes Height loss: yes, -0.5"   Pertinent review of systems: ***  Relevant historical information: ***   Exam:  There were no vitals taken for this visit. General: Well Developed, well nourished, and in no acute distress.   MSK: ***    Lab and Radiology Results No results found for this or any previous visit (from the past 72 hour(s)). No results found.     Assessment and Plan: 67 y.o. female with ***   PDMP not reviewed this encounter. No orders of the defined types were placed in this encounter.  No orders of the defined types were placed in this encounter.    Discussed warning signs or symptoms. Please see discharge instructions. Patient expresses understanding.   ***

## 2023-10-15 ENCOUNTER — Telehealth: Payer: Self-pay

## 2023-10-15 ENCOUNTER — Telehealth: Payer: Self-pay | Admitting: Family Medicine

## 2023-10-15 ENCOUNTER — Encounter: Payer: Self-pay | Admitting: Family Medicine

## 2023-10-15 ENCOUNTER — Ambulatory Visit: Payer: Medicare HMO | Admitting: Family Medicine

## 2023-10-15 VITALS — BP 144/74 | HR 71 | Ht 68.0 in | Wt 169.0 lb

## 2023-10-15 DIAGNOSIS — M810 Age-related osteoporosis without current pathological fracture: Secondary | ICD-10-CM

## 2023-10-15 DIAGNOSIS — F4321 Adjustment disorder with depressed mood: Secondary | ICD-10-CM | POA: Diagnosis not present

## 2023-10-15 DIAGNOSIS — M48061 Spinal stenosis, lumbar region without neurogenic claudication: Secondary | ICD-10-CM | POA: Diagnosis not present

## 2023-10-15 LAB — COMPREHENSIVE METABOLIC PANEL
ALT: 18 U/L (ref 0–35)
AST: 16 U/L (ref 0–37)
Albumin: 4.1 g/dL (ref 3.5–5.2)
Alkaline Phosphatase: 86 U/L (ref 39–117)
BUN: 20 mg/dL (ref 6–23)
CO2: 30 meq/L (ref 19–32)
Calcium: 10 mg/dL (ref 8.4–10.5)
Chloride: 106 meq/L (ref 96–112)
Creatinine, Ser: 0.72 mg/dL (ref 0.40–1.20)
GFR: 87.51 mL/min (ref >60.00)
Glucose, Bld: 122 mg/dL — ABNORMAL HIGH (ref 70–99)
Potassium: 4.6 meq/L (ref 3.5–5.1)
Sodium: 141 meq/L (ref 135–145)
Total Bilirubin: 0.4 mg/dL (ref 0.2–1.2)
Total Protein: 6.7 g/dL (ref 6.0–8.3)

## 2023-10-15 LAB — MAGNESIUM: Magnesium: 1.9 mg/dL (ref 1.5–2.5)

## 2023-10-15 LAB — VITAMIN D 25 HYDROXY (VIT D DEFICIENCY, FRACTURES): VITD: 36.48 ng/mL (ref 30.00–100.00)

## 2023-10-15 LAB — PHOSPHORUS: Phosphorus: 3 mg/dL (ref 2.3–4.6)

## 2023-10-15 NOTE — Telephone Encounter (Signed)
Pt would like you to call her to discuss about sports medicine appt today that she had  and some meds

## 2023-10-15 NOTE — Telephone Encounter (Signed)
Called and spoke to patient for about 15 minutes regarding her health and ov she had today.  Dm/cma

## 2023-10-15 NOTE — Patient Instructions (Signed)
Thank you for coming in today.   Please get labs today before you leave   Follow up with Delbert Harness for back injections

## 2023-10-15 NOTE — Telephone Encounter (Signed)
-----   Message from Adron Bene sent at 10/15/2023 12:14 PM EDT ----- Regarding: Prolia Please work to authorize [employer]

## 2023-10-16 NOTE — Progress Notes (Signed)
Labs look okay.  Vitamin D level is okay calcium is okay phosphorus magnesium is okay.  Continue vitamin D supplementation.  Okay to proceed to Prolia injection.

## 2023-10-16 NOTE — Telephone Encounter (Signed)
Prolia VOB initiated via [email or web address] ? ?New start ? ?

## 2023-10-17 NOTE — Telephone Encounter (Signed)
Contains abnormal data Comprehensive metabolic panel Order: [id number] Status: Final result     Visible to patient: No (inaccessible in MyChart)     Dx: Age-related osteoporosis without curr...   1 Result Note          Component Ref Range & Units 2 d ago (10/15/23) 3 mo ago (07/08/23) 1 yr ago (07/15/22) 2 yr ago (07/02/21) 4 yr ago (08/12/19) 5 yr ago (01/25/18) 5 yr ago (11/19/17)  Sodium 135 - 145 mEq/L 141 140 143 142 141 140 R 145  Potassium 3.5 - 5.1 mEq/L 4.6 4.6 5.1 4.3 4.0 3.7 R 5.3 High   Chloride 96 - 112 mEq/L 106 106 107 108 107 106 R 106  CO2 19 - 32 mEq/L 30 27 27 25 27 22  R 31  Glucose, Bld 70 - 99 mg/dL 409 High  96 811 High  107 High  103 High  147 High  R 95  BUN 6 - 23 mg/dL 20 14 18 20 20 14  R 20  Creatinine, Ser 0.40 - 1.20 mg/dL 9.14 7.82 9.56 2.13 0.86 0.69 R 0.72  Total Bilirubin 0.2 - 1.2 mg/dL 0.4   0.5 0.5  0.5  Alkaline Phosphatase 39 - 117 U/L 86   90 84  74  AST 0 - 37 U/L 16   16 16  14   ALT 0 - 35 U/L 18   16 19  18   Total Protein 6.0 - 8.3 g/dL 6.7   6.5 7.0  6.7  Albumin 3.5 - 5.2 g/dL 4.1   4.3 4.4  4.5  GFR >60.00 mL/min 87.51 84.84 CM 85.43 CM 91.99 CM 80.88  87.85  Comment: Calculated using the CKD-EPI Creatinine Equation (2021)  Calcium 8.4 - 10.5 mg/dL 57.8 9.5 9.9 9.7 9.7 46.9 R 10.0  Resulting Agency [place] [facility] [place] [facility] [place] [facility] [place] [facility] [place] [facility] [place] [facility]         Specimen Collected: 10/15/23 12:21 Last Resulted: 10/15/23 16:33      Lab Flowsheet      Order Details      View Encounter      Lab and Collection Details      Routing      Result History    View All Conversations on this Encounter      CM=Additional comments  R=Reference range differs from displayed range      Result Care Coordination   Result Notes   Rodolph Bong, MD 10/16/2023  6:44 AM EDT Back to Top    Labs look okay.  Vitamin D level is okay calcium is okay phosphorus magnesium is okay.   Continue vitamin D supplementation.  Okay to proceed to Prolia injection.

## 2023-10-20 NOTE — Telephone Encounter (Signed)
Prior Auth REQUIRED for Prolia  PA PROCESS DETAILS: PA is required. Call [phone] or complete the PA form available at [email or web address] Fax completed form to [phone]. Or submit PA request online at [email or web address]

## 2023-10-21 ENCOUNTER — Telehealth: Payer: Self-pay

## 2023-10-21 ENCOUNTER — Telehealth: Payer: Self-pay | Admitting: Family Medicine

## 2023-10-21 DIAGNOSIS — M48061 Spinal stenosis, lumbar region without neurogenic claudication: Secondary | ICD-10-CM

## 2023-10-21 DIAGNOSIS — M810 Age-related osteoporosis without current pathological fracture: Secondary | ICD-10-CM

## 2023-10-21 MED ORDER — DENOSUMAB 60 MG/ML ~~LOC~~ SOSY: 60.0000 mg | PREFILLED_SYRINGE | Freq: Once | SUBCUTANEOUS | 0 refills | Status: DC

## 2023-10-21 NOTE — Telephone Encounter (Signed)
Called pt and left VM to call the office.  

## 2023-10-21 NOTE — Telephone Encounter (Signed)
 Prior Authorization not required for patient/medication

## 2023-10-21 NOTE — Telephone Encounter (Signed)
[facility]

## 2023-10-21 NOTE — Telephone Encounter (Signed)
[name] [name], [name] [name] S28 minutes ago (2:40 PM)    Pt called the office and conveyed Dr. Zollie Pee result note. Pt verbalized understanding.   She was not interested in proceeding to Prolia, too expensive, too many side effects. She would like to just do Fosamax. She would also like on dosage advise for her calcium and vitamin D supplements.    She also wondered if a PT referral would help her learn what weight bearing exercise is recommended.   Please advise.

## 2023-10-21 NOTE — Telephone Encounter (Signed)
Pt would like a call to help her decide what to do about all her appts.

## 2023-10-21 NOTE — Addendum Note (Signed)
Addended by: Dierdre Searles on: 10/21/2023 10:32 AM   Modules accepted: Orders

## 2023-10-21 NOTE — Telephone Encounter (Signed)
Preferred medications:

## 2023-10-21 NOTE — Addendum Note (Signed)
Addended by: Dierdre Searles on: 10/21/2023 03:10 PM   Modules accepted: Orders

## 2023-10-21 NOTE — Telephone Encounter (Signed)
Pt called the office and conveyed Dr. Zollie Pee result note. Pt verbalized understanding.  She was not interested in proceeding to Prolia, too expensive, too many side effects. She would like to just do Fosamax. She would also like on dosage advise for her calcium and vitamin D supplements.   She also wondered if a PT referral would help her learn what weight bearing exercise is recommended.  Please advise.

## 2023-10-21 NOTE — Telephone Encounter (Addendum)
The patient currently has access to the requested medication and a Prior Authorization is not needed for the patient/medication. 

## 2023-10-22 MED ORDER — ALENDRONATE SODIUM 70 MG PO TABS: 70.0000 mg | ORAL_TABLET | ORAL | 11 refills | Status: DC

## 2023-10-22 NOTE — Telephone Encounter (Signed)
Unable to leave a VM due to mailbox is full. Will try later. Dm/cma

## 2023-10-22 NOTE — Addendum Note (Signed)
Addended by: Rodolph Bong on: 10/22/2023 05:48 AM   Modules accepted: Orders

## 2023-10-22 NOTE — Telephone Encounter (Signed)
I have prescribed Fosamax.  Continue your current dose of vitamin D.  The level seem appropriate.  Consider taking the 1000 mg of calcium daily.  As for strengthening exercises a personal trainer would be a good start.  I could place a referral to physical therapy but I think a personal trainer is more than adequate to come up with an exercise plan for you.  Let me know if you would like.

## 2023-10-23 ENCOUNTER — Other Ambulatory Visit: Payer: Self-pay

## 2023-10-23 MED ORDER — ALENDRONATE SODIUM 70 MG PO TABS: 70.0000 mg | ORAL_TABLET | ORAL | 11 refills | Status: DC

## 2023-10-23 NOTE — Telephone Encounter (Signed)
Called pt and left VM to call the office.  

## 2023-10-23 NOTE — Addendum Note (Signed)
Addended by: Dierdre Searles on: 10/23/2023 02:59 PM   Modules accepted: Orders

## 2023-10-23 NOTE — Telephone Encounter (Signed)
Unable to leave a VM due to mailbox is full. Will try later. Dm/cma

## 2023-10-23 NOTE — Telephone Encounter (Signed)
Called and spoke with patient.   We discussed Fosamax, pt will consider taking but [facility]. Rx has been sent.   We discussed PT vs personal trainer. Pt opted to proceed with Gayla Doss at [facility]. Referral has been placed.   We discussed Ca + D + K2 supplements that pt is considering taking.   Pt will call back with any other questions or concerns.

## 2023-10-27 DIAGNOSIS — M9902 Segmental and somatic dysfunction of thoracic region: Secondary | ICD-10-CM | POA: Diagnosis not present

## 2023-10-27 DIAGNOSIS — M9908 Segmental and somatic dysfunction of rib cage: Secondary | ICD-10-CM | POA: Diagnosis not present

## 2023-10-27 DIAGNOSIS — M9901 Segmental and somatic dysfunction of cervical region: Secondary | ICD-10-CM | POA: Diagnosis not present

## 2023-10-27 DIAGNOSIS — M47812 Spondylosis without myelopathy or radiculopathy, cervical region: Secondary | ICD-10-CM | POA: Diagnosis not present

## 2023-10-27 DIAGNOSIS — M9903 Segmental and somatic dysfunction of lumbar region: Secondary | ICD-10-CM | POA: Diagnosis not present

## 2023-10-27 DIAGNOSIS — M546 Pain in thoracic spine: Secondary | ICD-10-CM | POA: Diagnosis not present

## 2023-10-27 DIAGNOSIS — R0782 Intercostal pain: Secondary | ICD-10-CM | POA: Diagnosis not present

## 2023-10-27 DIAGNOSIS — M5416 Radiculopathy, lumbar region: Secondary | ICD-10-CM | POA: Diagnosis not present

## 2023-10-27 DIAGNOSIS — M5137 Other intervertebral disc degeneration, lumbosacral region with discogenic back pain only: Secondary | ICD-10-CM | POA: Diagnosis not present

## 2023-10-27 DIAGNOSIS — M9904 Segmental and somatic dysfunction of sacral region: Secondary | ICD-10-CM | POA: Diagnosis not present

## 2023-11-12 ENCOUNTER — Ambulatory Visit: Payer: Medicare HMO | Attending: Family Medicine | Admitting: Physical Therapy

## 2023-11-12 DIAGNOSIS — M810 Age-related osteoporosis without current pathological fracture: Secondary | ICD-10-CM | POA: Diagnosis not present

## 2023-11-12 DIAGNOSIS — R29898 Other symptoms and signs involving the musculoskeletal system: Secondary | ICD-10-CM | POA: Insufficient documentation

## 2023-11-12 DIAGNOSIS — M542 Cervicalgia: Secondary | ICD-10-CM | POA: Insufficient documentation

## 2023-11-12 DIAGNOSIS — M48061 Spinal stenosis, lumbar region without neurogenic claudication: Secondary | ICD-10-CM | POA: Diagnosis not present

## 2023-11-12 DIAGNOSIS — M5459 Other low back pain: Secondary | ICD-10-CM | POA: Diagnosis not present

## 2023-11-12 NOTE — Therapy (Signed)
OUTPATIENT PHYSICAL THERAPY THORACOLUMBAR EVALUATION   Patient Name: [name] [name] MRN: [id number] DOB:[birth year], 67 y.o., female Today's Date: 11/12/2023  END OF SESSION:  PT End of Session - 11/12/23 1202     Visit Number 1    Number of Visits 8    Date for PT Re-Evaluation 01/07/24    PT Start Time 1100    PT Stop Time 1150    PT Time Calculation (min) 50 min    Activity Tolerance Patient tolerated treatment well    Behavior During Therapy WFL for tasks assessed/performed             Past Medical History:  Diagnosis Date   Anxiety    Arthritis    HANDS,  KNEES   Bone tumor (benign)    RIGHT FEMUR   Borderline hypertension    Female pelvic pain    Fibromyalgia    Left knee injury    TENDONDINITIS/  CHONDRAMALIA   PONV (postoperative nausea and vomiting)    Past Surgical History:  Procedure Laterality Date   DILATION AND CURETTAGE OF UTERUS  1986  &  1988   RETAINED PLACENTA   jaw tumor Right 2020   right lower jaw tumor removed 2020 by Dr. Retta Mac   LAPAROSCOPIC ASSISTED VAGINAL HYSTERECTOMY  01-25-2003   LAPAROSCOPY N/A 04/18/2014   Procedure: LAPAROSCOPY DIAGNOSTIC;  Surgeon: Meriel Pica, MD;  Location: [facility] [place];  Service: Gynecology;  Laterality: N/A;   NASAL SEPTUM SURGERY  AGE 12   OPEN REDUCTION INTERNAL FIXATION (ORIF) DISTAL RADIAL FRACTURE Left 09/18/2020   Procedure: OPEN TREATMENT OF LEFT DISTAL RADIUS FRACTURE;  Surgeon: Mack Hook, MD;  Location: [facility];  Service: Orthopedics;  Laterality: Left;  LENGTH OF SURGERY: 75 MIN  PRE OP BLOCK   SALPINGOOPHORECTOMY Bilateral 04/18/2014   Procedure: SALPINGO OOPHORECTOMY;  Surgeon: Meriel Pica, MD;  Location: [facility];  Service: Gynecology;  Laterality: Bilateral;   TONSILLECTOMY  AGE 82   TUBAL LIGATION     Patient Active Problem List   Diagnosis Date Noted   Spinal stenosis at L4-L5 level 02/26/2023   Neuroforaminal stenosis of  lumbar spine 02/26/2023   Hyperlipidemia 12/25/2021   Scoliosis deformity of spine 04/24/2021   Degeneration of spine 04/24/2021   Anxiety 04/24/2021   Osteoporosis 09/02/2019   Adjustment disorder with mixed anxiety and depressed mood 07/04/2015   Allergic rhinitis 07/04/2015   Essential hypertension 03/24/2015   Tobacco abuse 09/01/2012   Fibromyalgia    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   She had PT before with increased pain Jan '24.  She has significant osteoporosis but doe not know how to best build her strength.  She does not want injections for her bone density OR for her back pain. Pt reports difficulty with the following activities:  bending, pulling, standing long periods.  She has pain when pulling wet clothes out of the machine.  Walking increases back and knee pain > 20 min.  Even loading dishwasher. Denies weakness but does has L knee issues.    PERTINENT HISTORY:  MD notes: Additionally she notes chronic low back pain with pain radiating down both legs. This problem has been well addressed by Dr. Madelon Lips in [facility]. She has had a trial of physical therapy and oral NSAIDs which did not work and made her feel worse. She did see Dr. Maurice Small who recommended  back injections after her lumbar spine MRI showing spinal stenosis. She has not yet proceeded with back injections as she was worried about side effects.  Knee pain, fibro Neck pain  Wrist fx   PAIN:  Are you having pain? Yes: NPRS scale: 5/10 Pain location: low back low lumbar and tailbone  Pain description: tight, sore, achy  Aggravating factors: pulling, lifting  Relieving factors: sitting, leaning forward, recliner   PRECAUTIONS: Other: Osteoporosis   RED FLAGS: None   WEIGHT BEARING RESTRICTIONS: No  FALLS:  Has patient fallen in last 6 months? 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  DIAGNOSTIC FINDINGS:  Spine osteopenia T score -2.5 -2.7 in hips. Spine -1.7   PATIENT SURVEYS:  NT on eval   SCREENING FOR RED FLAGS: Bowel or bladder incontinence: No Spinal tumors: No Cauda equina syndrome: No Compression fracture: No Abdominal aneurysm: No  COGNITION: Overall cognitive status: Within functional limits for tasks assessed     SENSATION: WFL  MUSCLE LENGTH: Hamstrings: WNL  Thomas test: WNL Tight quads L > R    POSTURE: rounded shoulders and forward head  PALPATION: Pain in low lumbar region bilateral L4-L5 R> L .  Pain in gluteals , soreness.   LUMBAR ROM:   AROM eval  Flexion NT  Extension 50% limited pain  Right lateral flexion WNL pain   Left lateral flexion WNL   Right rotation 25%   Left rotation 25% pain on Rt    (Blank rows = not tested)  LOWER EXTREMITY ROM:   WNL   Passive  Right eval Left eval  Hip flexion    Hip extension    Hip abduction    Hip adduction    Hip  internal rotation    Hip external rotation    Knee flexion    Knee extension    Ankle dorsiflexion    Ankle plantarflexion    Ankle inversion    Ankle eversion     (Blank rows = not tested)  LOWER EXTREMITY MMT:    MMT Right eval Left eval  Hip flexion 5 5  Hip extension    Hip abduction 4- 5  Hip adduction  Hip internal rotation    Hip external rotation    Knee flexion 4+ 5  Knee extension 4+ 5  Ankle dorsiflexion    Ankle plantarflexion    Ankle inversion    Ankle eversion     (Blank rows = not tested)  LUMBAR SPECIAL TESTS:  Straight leg raise test: Negative and NT  FUNCTIONAL TESTS:  5 times sit to stand: 18 sec  Hinge vs squat   GAIT: Distance walked: 150 Assistive device utilized: None Level of assistance: Modified independence Comments: no deviations   TODAY'S TREATMENT:                                                                                                                              DATE: 11/12/23   Self care: POC, Osteoporosis precautions, flexion vs extension, anatomy, MRI review, posture, importance of core and muscle strength    PATIENT EDUCATION:  Education details: see above  Person educated: Patient Education method: Explanation, Demonstration, Verbal cues, and Handouts Education comprehension: verbalized understanding and needs further education  HOME EXERCISE PROGRAM: Access Code: [email or web address] URL: [email or web address] Date: 11/12/2023 Prepared by: Karie Mainland  Exercises - Beginner Bridge  - 1 x daily - 7 x weekly - 2 sets - 10 reps - 5 hold - Sidelying Hip Abduction  - 1 x daily - 7 x weekly - 2 sets - 10 reps - 5 hold - Sit to Stand Without Arm Support  - 1 x daily - 7 x weekly - 2 sets - 10 reps - 5 hold  ASSESSMENT:  CLINICAL IMPRESSION: Patient is a 67 y.o. female  who was seen today for physical therapy evaluation and treatment for back pain (neck, lower) due to spinal stenosis in the presence of  osteoporosis. She will focus on core and strengthening with hopes of transitioning to community exercises   OBJECTIVE IMPAIRMENTS: decreased activity tolerance, decreased mobility, difficulty walking, decreased ROM, decreased strength, increased fascial restrictions, impaired flexibility, improper body mechanics, and pain.   ACTIVITY LIMITATIONS: carrying, lifting, bending, standing, squatting, sleeping, stairs, and locomotion level  PARTICIPATION LIMITATIONS: meal prep, cleaning, laundry, interpersonal relationship, shopping, and community activity  PERSONAL FACTORS: Past/current experiences, Time since onset of injury/illness/exacerbation, and 1-2 comorbidities: fibromyalgia, knee pain   are also affecting patient's functional outcome.   REHAB POTENTIAL: Excellent  CLINICAL DECISION MAKING: Stable/uncomplicated  EVALUATION COMPLEXITY: Low   GOALS: Goals reviewed with patient? Yes  SHORT TERM GOALS: Target date: 12/10/2023    Pt will be I HEP for strength  Baseline: Goal status: INITIAL  2.  FOTO will be completed and goal set  Baseline:  Goal status: INITIAL  3.  Pt will demo proper and safe lifting with hip hinge techniques.  Baseline:  Goal status: INITIAL   LONG TERM GOALS: Target date: 01/07/2024    Pt will be I with HEP upon discharge  Baseline:  Goal status: INITIAL  2.  Pt will be able to tolerate  standing for 30 min at a time without increasing pain Baseline:  Goal status: INITIAL  3.  Pt will be able to load dishwasher, pull out laundry without increased pain in back  Baseline:  Goal status: INITIAL  4.  Pt will be able to report no increased neck pain with driving, turning head  Baseline:  Goal status: INITIAL  5.  Pt will begin walking program for improved bone density 3 x per week x 30 min  Baseline:  Goal status: INITIAL  6.  FOTO score TBA  Baseline:  Goal status: INITIAL  PLAN:  PT FREQUENCY: 1x/week  PT DURATION: 8 weeks  PLANNED  INTERVENTIONS: 97164- PT Re-evaluation, 97110-Therapeutic exercises, 97530- Therapeutic activity, [id number]- Neuromuscular re-education, 97535- Self Care, 16109- Manual therapy, Patient/Family education, and Spinal mobilization.  PLAN FOR NEXT SESSION: check HEP, add on as tol., posture, NEUTRAL core    Jacqualyn Sedgwick, PT 11/12/2023, 1:15 PM   Karie Mainland, PT 11/12/23 1:22 PM Phone: [phone] Fax: [phone]

## 2023-11-25 ENCOUNTER — Encounter: Payer: Self-pay | Admitting: Family Medicine

## 2023-11-25 ENCOUNTER — Ambulatory Visit: Payer: Medicare HMO | Admitting: Family Medicine

## 2023-11-25 VITALS — BP 132/70 | HR 68 | Temp 97.6°F | Ht 68.0 in | Wt 170.0 lb

## 2023-11-25 DIAGNOSIS — I10 Essential (primary) hypertension: Secondary | ICD-10-CM

## 2023-11-25 DIAGNOSIS — M479 Spondylosis, unspecified: Secondary | ICD-10-CM

## 2023-11-25 DIAGNOSIS — L309 Dermatitis, unspecified: Secondary | ICD-10-CM

## 2023-11-25 DIAGNOSIS — M810 Age-related osteoporosis without current pathological fracture: Secondary | ICD-10-CM | POA: Diagnosis not present

## 2023-11-25 DIAGNOSIS — R599 Enlarged lymph nodes, unspecified: Secondary | ICD-10-CM | POA: Diagnosis not present

## 2023-11-25 HISTORY — DX: Dermatitis, unspecified: L30.9

## 2023-11-25 MED ORDER — MOMETASONE FUROATE 0.1 % EX CREA: TOPICAL_CREAM | CUTANEOUS | 1 refills | Status: DC

## 2023-11-25 NOTE — Assessment & Plan Note (Signed)
BP is in acceptable control. As her lisinopril could be a source of cough, I recommend she stop her lisinopril for now. We will continue to monitor her blood pressure as well as for resolution of her cough.

## 2023-11-25 NOTE — Assessment & Plan Note (Signed)
Currently engaged in PT.

## 2023-11-25 NOTE — Assessment & Plan Note (Signed)
[name] [name] bone density is consistent with osteoporosis. I recommend she be taking a daily calcium with Vit. D supplement. She is working with Dr. Denyse Amass regarding starting Prolia.

## 2023-11-25 NOTE — Assessment & Plan Note (Signed)
I will try a mid-potency cream to see if this will control. I wonder if this might actually be a reaction to the metal in her necklace. If persistent, we may need to have her do a trial of not using standard necklaces (silver or gold only).

## 2023-11-25 NOTE — Progress Notes (Signed)
[facility] [address] [place] [address] Dept: [phone] Dept Fax: [phone]  Chronic Care Office Visit  Subjective:    Patient ID: [name] [name], female    DOB: [birth year], 67 y.o..   MRN: [id number]  Chief Complaint  Patient presents with   Neck Injury    C/o having pain/knot in the RT side on neck.  Also states she is starting to get a migraine.Marland Kitchen   History of Present Illness:  Patient is in today for reassessment of chronic medical issues.  [name] [name] has a history of hypertension. She is not currently on meds, as she had a persistent cough on lisinopril.   [name] [name] notes she has a small lump in her anterior right neck. Her husband had lymphoma in the past, so she worries about whether this could be something serious. The lump has been present about 6 weeks. She had a prior pneumonia in Oct. she also had to have a crown in the lower right jaw , but this was after this lump was found.  [name] [name] had a bone density test this Fall that showed values c/w osteoporosis. She did see Dr. Denyse Amass (sports medicine) who is looking to get her on Prolia. She remains torn about this.  [name] [name] has a history of chronic low back pain due to degenerative disease. She notes Dr. Denyse Amass is referring her to physical therapy regarding this.  [name] [name] complains on ongoing rash on her chest, which has been pruritic. She has tried using hydrocortisone without improvement.  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 Retta Mac   LAPAROSCOPIC ASSISTED VAGINAL HYSTERECTOMY  01-25-2003   LAPAROSCOPY N/A 04/18/2014   Procedure: LAPAROSCOPY DIAGNOSTIC;  Surgeon: Meriel Pica, MD;  Location: [facility];  Service: Gynecology;  Laterality: N/A;   NASAL SEPTUM SURGERY  AGE 89   OPEN REDUCTION INTERNAL FIXATION (ORIF) DISTAL RADIAL FRACTURE Left 09/18/2020   Procedure: OPEN TREATMENT OF LEFT DISTAL RADIUS FRACTURE;  Surgeon: Mack Hook, MD;  Location: [facility];  Service: Orthopedics;  Laterality: Left;  LENGTH OF SURGERY: 75 MIN  PRE OP BLOCK   SALPINGOOPHORECTOMY Bilateral 04/18/2014   Procedure: SALPINGO OOPHORECTOMY;  Surgeon: Meriel Pica, MD;  Location: [facility];  Service: Gynecology;  Laterality: Bilateral;   TONSILLECTOMY  AGE 58   TUBAL LIGATION     Family History  Problem Relation Age of Onset   Diabetes Mother    Heart disease Mother    Cancer Father        Lung   Diabetes Sister    Diabetes Sister    Colon cancer Neg Hx    Stomach cancer Neg Hx    Outpatient Medications Prior to  Visit  Medication Sig Dispense Refill   Cholecalciferol (VITAMIN D3) 2000 UNITS TABS Take 1 capsule by mouth daily.     ezetimibe (ZETIA) 10 MG tablet TAKE 1 TABLET BY MOUTH EVERY DAY 90 tablet 3   Magnesium 250 MG TABS Take 1 tablet by mouth daily.     vitamin E 400 UNIT capsule Take 400 Units by mouth daily.      alendronate (FOSAMAX) 70 MG tablet Take 1 tablet (70 mg total) by mouth every 7 (seven) days. (Patient not taking: Reported on 11/12/2023) 4 tablet 11   cyclobenzaprine (FLEXERIL) 10 MG tablet Take 1-2 tablets (10-20 mg total) by mouth in the morning and at bedtime.  (Patient not taking: Reported on 11/12/2023) 30 tablet 0   guaiFENesin-codeine 100-10 MG/5ML syrup Take 5 mLs by mouth 3 (three) times daily as needed for cough. (Patient not taking: Reported on 11/12/2023) 120 mL 1   Omega-3 Fatty Acids (OMEGA 3 PO) Take by mouth. (Patient not taking: Reported on 11/12/2023)     No facility-administered medications prior to visit.   Allergies  Allergen Reactions   Hydrocodone Rash   Demerol [Meperidine] Nausea And Vomiting    severe   Molds & Smuts    Sulfa Antibiotics Swelling and Rash   Objective:   Today's Vitals   11/25/23 1341  BP: 132/70  Pulse: 68  Temp: 97.6 F (36.4 C)  TempSrc: Temporal  SpO2: 97%  Weight: 170 lb (77.1 kg)  Height: 5\' 8"  (1.727 m)   Body mass index is 25.85 kg/m.   General: Well developed, well nourished. No acute distress. Neck: Supple. There is a small 1 cm lymph node in the mid anterior cervical chain on the right. Skin: Warm and dry. There is some macular, red patches on the upper chest with small excoriations. Psych: Alert and oriented. Normal mood and affect.  Health Maintenance Due  Topic Date Due   HIV Screening  Never done   Zoster Vaccines- Shingrix (1 of 2) Never done   Colonoscopy  10/14/2022      Assessment & Plan:   Problem List Items Addressed This Visit       Cardiovascular and Mediastinum   Essential hypertension    BP is in acceptable control. As her lisinopril could be a source of cough, I recommend she stop her lisinopril for now. We will continue to monitor her blood pressure as well as for resolution of her cough.        Musculoskeletal and Integument   Degeneration of spine    Currently engaged in PT.      Eczema    I will try a mid-potency cream to see if this will control. I wonder if this might actually be a reaction to the metal in her necklace. If persistent, we may need to have her do a trial of not using standard necklaces (silver or gold only).      Relevant  Medications   mometasone (ELOCON) 0.1 % cream   Osteoporosis    [name] [name]'s bone density is consistent with osteoporosis. I recommend she be taking a daily calcium with Vit. D supplement. She is working with Dr. Denyse Amass regarding starting Prolia.      Other Visit Diagnoses     Lymph node enlargement    -  Primary   Reassured that this feels like a normal reactive lymph node. I will reassess this in 3 months.       Return in about 3 months (around  02/23/2024) for Reassessment.   Loyola Mast, MD

## 2023-12-03 ENCOUNTER — Ambulatory Visit: Payer: HMO | Admitting: Physical Therapy

## 2023-12-04 ENCOUNTER — Encounter: Payer: Self-pay | Admitting: Nurse Practitioner

## 2023-12-04 ENCOUNTER — Ambulatory Visit: Payer: Self-pay | Admitting: Family Medicine

## 2023-12-04 ENCOUNTER — Ambulatory Visit: Payer: Medicare HMO | Admitting: Nurse Practitioner

## 2023-12-04 ENCOUNTER — Telehealth: Payer: Self-pay

## 2023-12-04 VITALS — BP 144/78 | HR 67 | Temp 98.9°F | Ht 68.0 in | Wt 168.6 lb

## 2023-12-04 DIAGNOSIS — I10 Essential (primary) hypertension: Secondary | ICD-10-CM | POA: Diagnosis not present

## 2023-12-04 DIAGNOSIS — R051 Acute cough: Secondary | ICD-10-CM | POA: Diagnosis not present

## 2023-12-04 DIAGNOSIS — U071 COVID-19: Secondary | ICD-10-CM | POA: Insufficient documentation

## 2023-12-04 HISTORY — DX: COVID-19: U07.1

## 2023-12-04 LAB — POC COVID19 BINAXNOW: SARS Coronavirus 2 Ag: POSITIVE — AB

## 2023-12-04 MED ORDER — NIRMATRELVIR/RITONAVIR (PAXLOVID)TABLET: 3.0000 | ORAL_TABLET | Freq: Two times a day (BID) | ORAL | 0 refills | Status: AC

## 2023-12-04 NOTE — Progress Notes (Signed)
Acute Office Visit  Subjective:     Patient ID: [name] [name], female    DOB: [birth year], 67 y.o.   MRN: [id number]  Chief Complaint  Patient presents with   Head Congestion    With cough and pressure in ears, headache for 4days    HPI Patient is in today for head congestion and cough for 4 days.   Discussed the use of AI scribe software for clinical note transcription with the patient, who gave verbal consent to proceed.  History of Present Illness   [name] [name], a patient with a history of sinus infections and allergies, presents with a constellation of symptoms that began 4 days ago. The patient initially experienced a dull headache, which progressed to a severe, all-day headache on Monday. The headache was somewhat relieved by ibuprofen. The patient also reports extreme fatigue, necessitating two to three-hour naps during the day, which is unusual for her.  In addition to the headache and fatigue, the patient developed a cough yesterday and has been experiencing discomfort in her left ear, which she describes as itchy. She also reports difficulty breathing through her nose when lying on her side. The patient denies any gastrointestinal symptoms and has been able to eat normally. She has not had a fever but reports occasional chills.  The patient has been using Flonase and taking ibuprofen to manage her symptoms. She also took an over-the-counter allergy medication one day but did not notice a significant difference in her symptoms. The patient has been in contact with her young granddaughter, who recently had an ear infection, and she attended a cheerleading competition and a basketball game last week.      ROS See pertinent positives and negatives per HPI.    Objective:    BP (!) 144/78 (BP Location: Left Arm, Cuff Size: Normal)   Pulse 67   Temp 98.9 F (37.2 C) (Oral)   Ht 5\' 8"  (1.727 m)   Wt 168 lb 9.6 oz (76.5 kg)   SpO2 97%   BMI 25.64 kg/m     Physical  Exam Vitals and nursing note reviewed.  Constitutional:      General: She is not in acute distress.    Appearance: Normal appearance.  HENT:     Head: Normocephalic.     Right Ear: Tympanic membrane, ear canal and external ear normal.     Left Ear: Ear canal and external ear normal. Tympanic membrane is erythematous (slight).  Eyes:     Conjunctiva/sclera: Conjunctivae normal.  Cardiovascular:     Rate and Rhythm: Normal rate and regular rhythm.     Pulses: Normal pulses.     Heart sounds: Normal heart sounds.  Pulmonary:     Effort: Pulmonary effort is normal.     Breath sounds: Normal breath sounds.  Musculoskeletal:     Cervical back: Normal range of motion.  Skin:    General: Skin is warm.  Neurological:     General: No focal deficit present.     Mental Status: She is alert and oriented to person, place, and time.  Psychiatric:        Mood and Affect: Mood normal.        Behavior: Behavior normal.        Thought Content: Thought content normal.        Judgment: Judgment normal.     Results for orders placed or performed in visit on 12/04/23  POC COVID-19 BinaxNow  Result Value Ref Range  SARS Coronavirus 2 Ag Positive (A) Negative        Assessment & Plan:   Problem List Items Addressed This Visit       Cardiovascular and Mediastinum   Essential hypertension   She was recently taken off Lisinopril due to a cough, and her current blood pressure is elevated, possibly due to illness and stress. She is advised to monitor her blood pressure at home or at a local pharmacy.         Other   COVID-19 - Primary   She tested positive for COVID-19, presenting with fatigue, headache, cough, and an ear infection. Her smoking habit and history of hypertension increase her risk for severe disease. Start Paxlovid treatment and have sent a prescription to [facility] in [place]. Supportive care will continue with rest, hydration, ibuprofen for symptom relief, and Flonase for nasal  symptoms. She is advised to self-isolate until symptoms improve.      Relevant Medications   nirmatrelvir/ritonavir (PAXLOVID) 20 x 150 MG & 10 x 100MG  TABS   Other Visit Diagnoses       Acute cough       Covid 19 positive. See plan above   Relevant Orders   POC COVID-19 BinaxNow (Completed)      Meds ordered this encounter  Medications   nirmatrelvir/ritonavir (PAXLOVID) 20 x 150 MG & 10 x 100MG  TABS    Sig: Take 3 tablets by mouth 2 (two) times daily for 5 days. (Take nirmatrelvir 150 mg two tablets twice daily for 5 days and ritonavir 100 mg one tablet twice daily for 5 days) Patient GFR is 87    Dispense:  30 tablet    Refill:  0    Return if symptoms worsen or fail to improve.  Gerre Scull, NP

## 2023-12-04 NOTE — Assessment & Plan Note (Signed)
She tested positive for COVID-19, presenting with fatigue, headache, cough, and an ear infection. Her smoking habit and history of hypertension increase her risk for severe disease. Start Paxlovid treatment and have sent a prescription to [facility] in [place]. Supportive care will continue with rest, hydration, ibuprofen for symptom relief, and Flonase for nasal symptoms. She is advised to self-isolate until symptoms improve.

## 2023-12-04 NOTE — Telephone Encounter (Signed)
Copied from CRM [id number]. Topic: Clinical - Medication Question >> Dec 04, 2023  2:27 PM [name] [name] wrote: Reason for CRM: Patient has questions regarding medication prescribed. She stated that medicine says take it in the morning and afternoon but when she got it this afternoon and wants to know if she can go ahead and take it tonight or wait until morning. Patient also has an ear infection as well and wants to know if that same medication can be used for ear infection that she was seen for today or does a prescription for antibiotic need to be called in.   Spoke to provider and patient, she doesn't need an antibotic at this time and was told it should clear up on its own at the office visit.   She is good to take one dose of the Paxlovid and then another at bedtime, then take twice daily to complete coarse of medication.   Patient advised and no further questions.

## 2023-12-04 NOTE — Assessment & Plan Note (Signed)
She was recently taken off Lisinopril due to a cough, and her current blood pressure is elevated, possibly due to illness and stress. She is advised to monitor her blood pressure at home or at a local pharmacy.

## 2023-12-04 NOTE — Telephone Encounter (Signed)
Copied from CRM [id number]. Topic: Clinical - Red Word Triage >> Dec 04, 2023  2:34 PM [name] [name] wrote: Red Word that prompted transfer to Nurse Triage: Pt haas a headache from COVID and she wants to take Motrin for it and wants to know if the medication she was just prescribed by the doctor is safe to mix.   Chief Complaint: COVID symptoms Symptoms: COVID related and ear pain Frequency: Today Pertinent Negatives: Patient denies relief Disposition: [] ED /[] Urgent Care (no appt availability in office) / [] Appointment(In office/virtual)/ []  [place] [facility]/ [] Home Care/ [] Refused Recommended Disposition /[] [place] [facility]/ [x]  Follow-up with PCP Additional Notes: Patient called in with additional questions about the Paxlovid she was prescribed this afternoon. Patient wanted to know if she could take her first dose now or if she needed to wait until this evening to take it. I advised patient to take her first dose now and continue to follow the recommended dosage of 2 times per day. Patient also asked if she could take Motrin in addition to her Paxlovid and I advised her to try just the Paxlovid for now, as she still had not taken the first dose. Patient also complained of bilateral earache. Note from her appointment today reflects an ear infection is present in addition to her COVID symptoms. Patient wants to know if she should have been provided an antibiotic in addition to the Paxlovid. I advised patient that I would request a call back from the office regarding an antibiotic.   Reason for Disposition  Caller has medicine question, adult has minor symptoms, caller declines triage, AND triager answers question  Answer Assessment - Initial Assessment Questions 1. NAME of MEDICINE: "What medicine(s) are you calling about?"     Paxlovid 2. QUESTION: "What is your question?" (e.g., double dose of medicine, side effect)     Patient is asking if she can take her first dose now (2:30pm) or  if she needs to wait until tonight 3. PRESCRIBER: "Who prescribed the medicine?" Reason: if prescribed by specialist, call should be referred to that group.     Rodman Pickle (via chart) for COVID 4. SYMPTOMS: "Do you have any symptoms?" If Yes, ask: "What symptoms are you having?"  "How bad are the symptoms (e.g., mild, moderate, severe)     In office visit today for COVID symptoms and prescribed Paxlovid, also describes bilateral ear pain  Protocols used: Medication Question Call-A-AH

## 2023-12-04 NOTE — Telephone Encounter (Signed)
Please see other message Dm/cma  

## 2023-12-04 NOTE — Patient Instructions (Addendum)
It was great to see you!  Get plenty of fluids and get some rest   Start paxlovid twice a day for 5 days.   Let's follow-up if your symptoms worsen or don't improve   Take care,  Rodman Pickle, NP

## 2023-12-11 ENCOUNTER — Ambulatory Visit: Payer: HMO | Admitting: Physical Therapy

## 2023-12-18 ENCOUNTER — Ambulatory Visit: Payer: HMO | Attending: Family Medicine | Admitting: Physical Therapy

## 2023-12-18 ENCOUNTER — Encounter: Payer: Self-pay | Admitting: Physical Therapy

## 2023-12-18 DIAGNOSIS — M542 Cervicalgia: Secondary | ICD-10-CM | POA: Diagnosis not present

## 2023-12-18 DIAGNOSIS — M5459 Other low back pain: Secondary | ICD-10-CM | POA: Diagnosis not present

## 2023-12-18 DIAGNOSIS — R29898 Other symptoms and signs involving the musculoskeletal system: Secondary | ICD-10-CM | POA: Insufficient documentation

## 2023-12-18 NOTE — Therapy (Signed)
 OUTPATIENT PHYSICAL THERAPY THORACOLUMBAR EVALUATION   Patient Name: [name] [name] MRN: [id number] DOB:Apr 17, 1956, 68 y.o., female Today's Date: 12/18/2023  END OF SESSION:  PT End of Session - 12/18/23 1056     Visit Number 2    Number of Visits 8    Date for PT Re-Evaluation 01/07/24    PT Start Time 1058    PT Stop Time 1155    PT Time Calculation (min) 57 min    Activity Tolerance Patient tolerated treatment well    Behavior During Therapy WFL for tasks assessed/performed              Past Medical History:  Diagnosis Date   Anxiety    Arthritis    HANDS,  KNEES   Bone tumor (benign)    RIGHT FEMUR   Borderline hypertension    Female pelvic pain    Fibromyalgia    Left knee injury    TENDONDINITIS/  CHONDRAMALIA   PONV (postoperative nausea and vomiting)    Past Surgical History:  Procedure Laterality Date   DILATION AND CURETTAGE OF UTERUS  1986  &  1988   RETAINED PLACENTA   jaw tumor Right 2020   right lower jaw tumor removed 2020 by Dr. Dorthula   LAPAROSCOPIC ASSISTED VAGINAL HYSTERECTOMY  01-25-2003   LAPAROSCOPY N/A 04/18/2014   Procedure: LAPAROSCOPY DIAGNOSTIC;  Surgeon: Charlie CHRISTELLA Croak, MD;  Location: [facility] ;  Service: Gynecology;  Laterality: N/A;   NASAL SEPTUM SURGERY  AGE 83   OPEN REDUCTION INTERNAL FIXATION (ORIF) DISTAL RADIAL FRACTURE Left 09/18/2020   Procedure: OPEN TREATMENT OF LEFT DISTAL RADIUS FRACTURE;  Surgeon: Sebastian Lenis, MD;  Location: [facility];  Service: Orthopedics;  Laterality: Left;  LENGTH OF SURGERY: 75 MIN  PRE OP BLOCK   SALPINGOOPHORECTOMY Bilateral 04/18/2014   Procedure: SALPINGO OOPHORECTOMY;  Surgeon: Charlie CHRISTELLA Croak, MD;  Location[facility];  Service: Gynecology;  Laterality: Bilateral;   TONSILLECTOMY  AGE 80   TUBAL LIGATION     Patient Active Problem List   Diagnosis Date Noted   COVID-19 12/04/2023   Eczema 11/25/2023   Spinal stenosis at L4-L5 level  02/26/2023   Neuroforaminal stenosis of lumbar spine 02/26/2023   Hyperlipidemia 12/25/2021   Scoliosis deformity of spine 04/24/2021   Degeneration of spine 04/24/2021   Anxiety 04/24/2021   Osteoporosis 09/02/2019   Adjustment disorder with mixed anxiety and depressed mood 07/04/2015   Allergic rhinitis 07/04/2015   Essential hypertension 03/24/2015   Tobacco abuse 09/01/2012   Fibromyalgia    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    PCP: Thedora Senior MD   REFERRING PROVIDER: Joane Birmingham   REFERRING DIAG: M81.0 (ICD-10-CM) - Age-related osteoporosis without current pathological fracture M48.061 (ICD-10-CM) - Spinal stenosis at L4-L5 level  Rationale for Evaluation and Treatment: Rehabilitation  THERAPY DIAG:  Other low back pain  Other symptoms and signs involving the musculoskeletal system  ONSET DATE: chronic   SUBJECTIVE:  SUBJECTIVE STATEMENT: Feeling good today, no lingering issues or adverse effects from covid following quarantine.Wanted to ensure that we are working with her symptoms in neck as well as low back.  Stated she felt good  and almost looser at the end of session.  PERTINENT HISTORY:  MD notes: Additionally she notes chronic low back pain with pain radiating down both legs. This problem has been well addressed by Dr. Shari in [facility] Ibaz[facility]o [facility]. She has had a trial of physical therapy and oral NSAIDs which did not work and made her feel worse. She did see Dr. Ibazebo who recommended back injections after her lumbar spine MRI showing spinal stenosis. She has not yet proceeded with back injections as she was worried about side effects.  Knee pain, fibro Neck pain  Wrist fx   PAIN:  Are you having pain? Yes: NPRS scale: 5/10 Pain location: low  back low lumbar and tailbone  Pain description: tight, sore, achy  Aggravating factors: pulling, lifting  Relieving factors: sitting, leaning forward, recliner   PRECAUTIONS: Other: Osteoporosis   RED FLAGS: None   WEIGHT BEARING RESTRICTIONS: No  FALLS:  Has patient fallen in last 6 months? No  LIVING ENVIRONMENT: Lives with: lives with their spouse Lives in: House/apartment Stairs: Yes: Internal: 12 steps; does not use  Has following equipment at home: None  OCCUPATION: not working, retired financial trader svc  PLOF: Independent  PATIENT GOALS: I want to get stronger   NEXT MD VISIT: unknown   OBJECTIVE:  Note: Objective measures were completed at Evaluation unless otherwise noted.  DIAGNOSTIC FINDINGS:  Spine osteopenia T score -2.5 -2.7 in hips. Spine -1.7   PATIENT SURVEYS:  NT on eval  FOTO taken on 2nd visit (12/17/2022): Current: 51%, Expected: 61%  SCREENING FOR RED FLAGS: Bowel or bladder incontinence: No Spinal tumors: No Cauda equina syndrome: No Compression fracture: No Abdominal aneurysm: No  COGNITION: Overall cognitive status: Within functional limits for tasks assessed     SENSATION: WFL  MUSCLE LENGTH: Hamstrings: WNL  Thomas test: WNL Tight quads L > R    POSTURE: rounded shoulders and forward head  PALPATION: Pain in low lumbar region bilateral L4-L5 R> L .  Pain in gluteals , soreness.   LUMBAR ROM:   AROM eval  Flexion NT  Extension 50% limited pain  Right lateral flexion WNL pain   Left lateral flexion WNL   Right rotation 25%   Left rotation 25% pain on Rt    (Blank rows = not tested)  LOWER EXTREMITY ROM:   WNL   Passive  Right eval Left eval  Hip flexion    Hip extension    Hip abduction    Hip adduction    Hip internal rotation    Hip external rotation    Knee flexion    Knee extension    Ankle dorsiflexion    Ankle plantarflexion    Ankle inversion    Ankle eversion     (Blank rows = not tested)  LOWER  EXTREMITY MMT:    MMT Right eval Left eval  Hip flexion 5 5  Hip extension    Hip abduction 4- 5  Hip adduction    Hip internal rotation    Hip external rotation    Knee flexion 4+ 5  Knee extension 4+ 5  Ankle dorsiflexion    Ankle plantarflexion    Ankle inversion    Ankle eversion     (Blank rows = not  tested)  LUMBAR SPECIAL TESTS:  Straight leg raise test: Negative and NT  FUNCTIONAL TESTS:  5 times sit to stand: 18 sec  Hinge vs squat   GAIT: Distance walked: 150 Assistive device utilized: None Level of assistance: Modified independence Comments: no deviations  Today's Treatment [facility] Adult PT Treatment:                                                DATE: 12/18/2023  Therapeutic Exercise: Treadmill 5', 0.28mph Standing marches with UE assist for neutral spine, 2x12 (2s hold) Supine Bridge with bracing 2x12 (2s hold) Standing hip ABD w/UE support for neutral spine 2x8, 2#  Standing hip ext w/UE support for neutral spine 2x8, 2# Therapeutic Activity: Supine abdominal bracing  STS with cue for hip hinge and neutral spine 2x12  Self care: POC, Osteoporosis precautions, flexion vs extension, anatomy, MRI review, posture, importance of core and muscle strength    PATIENT EDUCATION:  Education details: see above  Person educated: Patient Education method: Explanation, Demonstration, Verbal cues, and Handouts Education comprehension: verbalized understanding and needs further education  HOME EXERCISE PROGRAM: Access Code: [email or web address] URL: [email or web address] Date: 11/12/2023 Prepared by: Delon Norma  Exercises - Beginner Bridge  - 1 x daily - 7 x weekly - 2 sets - 10 reps - 5 hold - Sidelying Hip Abduction  - 1 x daily - 7 x weekly - 2 sets - 10 reps - 5 hold - Sit to Stand Without Arm Support  - 1 x daily - 7 x weekly - 2 sets - 10 reps - 5 hold  ASSESSMENT:  CLINICAL IMPRESSION: Pt attended physical therapy session for continuation of  treatment regarding LBP and osteoporosis,. Pt tolerated treatment Great and demonstrated improvement with hip hinge quality as well as tolerance to exercise.. Some difficulties core bracing during activity and avoiding a lordotic posture when given the straight back cue. Pt required VC for safe and appropriate performance and technique of today's activities. Pt demonstrated good understanding of bracing and hip hinge cue by the end of session, should be reviewed and utilized in following sessions.   FOTO outcome measure was taken and recorded in obj.  Eval impression 11/12/2023: Patient is a 68 y.o. female  who was seen today for physical therapy evaluation and treatment for back pain (neck, lower) due to spinal stenosis in the presence of osteoporosis. She will focus on core and strengthening with hopes of transitioning to community exercises   OBJECTIVE IMPAIRMENTS: decreased activity tolerance, decreased mobility, difficulty walking, decreased ROM, decreased strength, increased fascial restrictions, impaired flexibility, improper body mechanics, and pain.   ACTIVITY LIMITATIONS: carrying, lifting, bending, standing, squatting, sleeping, stairs, and locomotion level  PARTICIPATION LIMITATIONS: meal prep, cleaning, laundry, interpersonal relationship, shopping, and community activity  PERSONAL FACTORS: Past/current experiences, Time since onset of injury/illness/exacerbation, and 1-2 comorbidities: fibromyalgia, knee pain   are also affecting patient's functional outcome.   REHAB POTENTIAL: Excellent  CLINICAL DECISION MAKING: Stable/uncomplicated  EVALUATION COMPLEXITY: Low   GOALS: Goals reviewed with patient? Yes  SHORT TERM GOALS: Target date: 12/10/2023    Pt will be I HEP for strength  Baseline: Goal status: INITIAL  2.  FOTO will be completed and goal set  Baseline:  Goal status: INITIAL  3.  Pt will demo proper and safe lifting with hip hinge techniques.  Baseline:  Goal status: INITIAL   LONG TERM GOALS: Target date: 01/07/2024    Pt will be I with HEP upon discharge  Baseline:  Goal status: INITIAL  2.  Pt will be able to tolerate standing for 30 min at a time without increasing pain Baseline:  Goal status: INITIAL  3.  Pt will be able to load dishwasher, pull out laundry without increased pain in back  Baseline:  Goal status: INITIAL  4.  Pt will be able to report no increased neck pain with driving, turning head  Baseline:  Goal status: INITIAL  5.  Pt will begin walking program for improved bone density 3 x per week x 30 min  Baseline:  Goal status: INITIAL  6.  Pt. Will achieve a FOTO score of 61% as to demonstrate improvement in self-perceived functional ability with daily activities. (NRP 12/18/2023)   Baseline: 51% Goal status: INITIAL  PLAN:  PT FREQUENCY: 1x/week  PT DURATION: 8 weeks  PLANNED INTERVENTIONS: 97164- PT Re-evaluation, 97110-Therapeutic exercises, 97530- Therapeutic activity, [id number]- Neuromuscular re-education, 97535- Self Care, [id number]- Manual therapy, Patient/Family education, and Spinal mobilization.  PLAN FOR NEXT SESSION: review HEP, Review postural cues. Progress as tolerated, posture, NEUTRAL core   Mabel Kiang, PT, DPT 12/18/2023, 12:03 PM

## 2023-12-24 NOTE — Therapy (Signed)
 OUTPATIENT PHYSICAL THERAPY NOTE   Patient Name: [name] [name] MRN: [id number] DOB:Jul 06, 1956, 68 y.o., female Today's Date: 12/25/2023  END OF SESSION:  PT End of Session - 12/25/23 1107     Visit Number 3    Number of Visits 8    Date for PT Re-Evaluation 01/07/24    PT Start Time 1104    PT Stop Time 1150    PT Time Calculation (min) 46 min    Activity Tolerance Patient tolerated treatment well    Behavior During Therapy WFL for tasks assessed/performed               Past Medical History:  Diagnosis Date   Anxiety    Arthritis    HANDS,  KNEES   Bone tumor (benign)    RIGHT FEMUR   Borderline hypertension    Female pelvic pain    Fibromyalgia    Left knee injury    TENDONDINITIS/  CHONDRAMALIA   PONV (postoperative nausea and vomiting)    Past Surgical History:  Procedure Laterality Date   DILATION AND CURETTAGE OF UTERUS  1986  &  1988   RETAINED PLACENTA   jaw tumor Right 2020   right lower jaw tumor removed 2020 by Dr. Dorthula   LAPAROSCOPIC ASSISTED VAGINAL HYSTERECTOMY  01-25-2003   LAPAROSCOPY N/A 04/18/2014   Procedure: LAPAROSCOPY DIAGNOSTIC;  Surgeon: Charlie CHRISTELLA Croak, MD;  Location: [facility] [place];  Service: Gynecology;  Laterality: N/A;   NASAL SEPTUM SURGERY  AGE 53   OPEN REDUCTION INTERNAL FIXATION (ORIF) DISTAL RADIAL FRACTURE Left 09/18/2020   Procedure: OPEN TREATMENT OF LEFT DISTAL RADIUS FRACTURE;  Surgeon: Sebastian Lenis, MD;  Location: [facility];  Service: Orthopedics;  Laterality: Left;  LENGTH OF SURGERY: 75 MIN  PRE OP BLOCK   SALPINGOOPHORECTOMY Bilateral 04/18/2014   Procedure: SALPINGO OOPHORECTOMY;  Surgeon: Charlie CHRISTELLA Croak, MD;  Location: [place];  Service: Gynecology;  Laterality: Bilateral;   TONSILLECTOMY  AGE 78   TUBAL LIGATION     Patient Active Problem List   Diagnosis Date Noted   COVID-19 12/04/2023   Eczema 11/25/2023   Spinal stenosis at L4-L5 level 02/26/2023    Neuroforaminal stenosis of lumbar spine 02/26/2023   Hyperlipidemia 12/25/2021   Scoliosis deformity of spine 04/24/2021   Degeneration of spine 04/24/2021   Anxiety 04/24/2021   Osteoporosis 09/02/2019   Adjustment disorder with mixed anxiety and depressed mood 07/04/2015   Allergic rhinitis 07/04/2015   Essential hypertension 03/24/2015   Tobacco abuse 09/01/2012   Fibromyalgia    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    PCP: Thedora Senior MD   REFERRING PROVIDER: Joane Birmingham   REFERRING DIAG: M81.0 (ICD-10-CM) - Age-related osteoporosis without current pathological fracture M48.061 (ICD-10-CM) - Spinal stenosis at L4-L5 level  Rationale for Evaluation and Treatment: Rehabilitation  THERAPY DIAG:  No diagnosis found.  ONSET DATE: chronic   SUBJECTIVE:  SUBJECTIVE STATEMENT: Pain in back and L knee.  Reports Lt knee ganglion cyst and tightness.  Low back 3/10   PERTINENT HISTORY:  MD notes: Additionally she notes chronic low back pain with pain radiating down both legs. This problem has been well addressed by Dr. Shari in [facility] Ibazebo and [facility]. She has had a trial of physical therapy and oral NSAIDs which did not work and made her feel worse. She did see Dr. Ibazebo who recommended back injections after her lumbar spine MRI showing spinal stenosis. She has not yet proceeded with back injections as she was worried about side effects.  Knee pain, fibro Neck pain  Wrist fx   PAIN:  Are you having pain? Yes: NPRS scale: 3/10 Pain location: low back low lumbar and tailbone  Pain description: tight, sore, achy  Aggravating factors: pulling, lifting  Relieving factors: sitting, leaning forward, recliner   PRECAUTIONS: Other: Osteoporosis   RED FLAGS: None   WEIGHT  BEARING RESTRICTIONS: No  FALLS:  Has patient fallen in last 6 months? No  LIVING ENVIRONMENT: Lives with: lives with their spouse Lives in: House/apartment Stairs: Yes: Internal: 12 steps; does not use  Has following equipment at home: None  OCCUPATION: not working, retired financial trader svc  PLOF: Independent  PATIENT GOALS: I want to get stronger   NEXT MD VISIT: unknown   OBJECTIVE:  Note: Objective measures were completed at Evaluation unless otherwise noted.  DIAGNOSTIC FINDINGS:  Spine osteopenia T score -2.5 -2.7 in hips. Spine -1.7   PATIENT SURVEYS:  NT on eval  FOTO taken on 2nd visit (12/17/2022): Current: 51%, Expected: 61%  SCREENING FOR RED FLAGS: Bowel or bladder incontinence: No Spinal tumors: No Cauda equina syndrome: No Compression fracture: No Abdominal aneurysm: No  COGNITION: Overall cognitive status: Within functional limits for tasks assessed     SENSATION: WFL  MUSCLE LENGTH: Hamstrings: WNL  Thomas test: WNL Tight quads L > R    POSTURE: rounded shoulders and forward head  PALPATION: Pain in low lumbar region bilateral L4-L5 R> L .  Pain in gluteals , soreness.   LUMBAR ROM:   AROM eval  Flexion NT  Extension 50% limited pain  Right lateral flexion WNL pain   Left lateral flexion WNL   Right rotation 25%   Left rotation 25% pain on Rt    (Blank rows = not tested)  LOWER EXTREMITY ROM:   WNL   Passive  Right eval Left eval  Hip flexion    Hip extension    Hip abduction    Hip adduction    Hip internal rotation    Hip external rotation    Knee flexion    Knee extension    Ankle dorsiflexion    Ankle plantarflexion    Ankle inversion    Ankle eversion     (Blank rows = not tested)  LOWER EXTREMITY MMT:    MMT Right eval Left eval  Hip flexion 5 5  Hip extension    Hip abduction 4- 5  Hip adduction    Hip internal rotation    Hip external rotation    Knee flexion 4+ 5  Knee extension 4+ 5  Ankle  dorsiflexion    Ankle plantarflexion    Ankle inversion    Ankle eversion     (Blank rows = not tested)  LUMBAR SPECIAL TESTS:  Straight leg raise test: Negative and NT  FUNCTIONAL TESTS:  5 times sit to stand: 18 sec  Hinge vs squat   GAIT: Distance walked: 150 Assistive device utilized: None Level of assistance: Modified independence Comments: no deviations    Today's Treatment   [facility] Adult PT Treatment:                                                DATE: 12/25/23 Therapeutic Exercise: NuStep L6 UE and LE for 6 min  Supine bridge x 10 Small ROM lower trunk rotation x 10 SLR x 10  SL hip abduction x 10  Sit to stand x 10  Standing march with 10 lbs wgt LAQ 5 lbs with ball squeeze  Wall sit with scap retraction  x 10   [facility] Adult PT Treatment:                                                DATE: 12/18/2023  Therapeutic Exercise: Treadmill 5', 0.20mph Standing marches with UE assist for neutral spine, 2x12 (2s hold) Supine Bridge with bracing 2x12 (2s hold) Standing hip ABD w/UE support for neutral spine 2x8, 2#  Standing hip ext w/UE support for neutral spine 2x8, 2# Therapeutic Activity: Supine abdominal bracing  STS with cue for hip hinge and neutral spine 2x12  Self care: POC, Osteoporosis precautions, flexion vs extension, anatomy, MRI review, posture, importance of core and muscle strength    PATIENT EDUCATION:  Education details: see above  Person educated: Patient Education method: Explanation, Demonstration, Verbal cues, and Handouts Education comprehension: verbalized understanding and needs further education  HOME EXERCISE PROGRAM: Access Code: [email or web address] URL: [email or web address] Date: 12/25/2023 Prepared by: Delon Norma  Exercises - Beginner Bridge  - 1 x daily - 7 x weekly - 2 sets - 10 reps - 5 hold - Sidelying Hip Abduction  - 1 x daily - 7 x weekly - 2 sets - 10 reps - 5 hold - Sit to Stand Without Arm Support  - 1 x daily - 7  x weekly - 2 sets - 10 reps - 5 hold - Wall Quarter Squat  - 1 x daily - 7 x weekly - 2 sets - 10 reps - 0-10 hold - Gastroc Stretch on Wall  - 1 x daily - 7 x weekly - 1 sets - 3 reps - 30 hold - Standing Marching  - 1 x daily - 7 x weekly - 2 sets - 10 reps   ASSESSMENT:  CLINICAL IMPRESSION: Patient able to tolerate exercises today with rest breaks and limitations from L knee pain .  She was educated on building bone density and need for strength training, walking program.  She was monitored for proper form and needed only min cues for knee alignment.     FOTO outcome measure was taken and recorded in obj.  Eval impression 11/12/2023: Patient is a 68 y.o. female  who was seen today for physical therapy evaluation and treatment for back pain (neck, lower) due to spinal stenosis in the presence of osteoporosis. She will focus on core and strengthening with hopes of transitioning to community exercises   OBJECTIVE IMPAIRMENTS: decreased activity tolerance, decreased mobility, difficulty walking, decreased ROM, decreased strength, increased fascial restrictions, impaired flexibility, improper body mechanics, and pain.   ACTIVITY LIMITATIONS: carrying, lifting, bending,  standing, squatting, sleeping, stairs, and locomotion level  PARTICIPATION LIMITATIONS: meal prep, cleaning, laundry, interpersonal relationship, shopping, and community activity  PERSONAL FACTORS: Past/current experiences, Time since onset of injury/illness/exacerbation, and 1-2 comorbidities: fibromyalgia, knee pain   are also affecting patient's functional outcome.   REHAB POTENTIAL: Excellent  CLINICAL DECISION MAKING: Stable/uncomplicated  EVALUATION COMPLEXITY: Low   GOALS: Goals reviewed with patient? Yes  SHORT TERM GOALS: Target date: 12/10/2023    Pt will be I HEP for strength  Baseline: Goal status: INITIAL  2.  FOTO will be completed and goal set  Baseline:  Goal status: INITIAL  3.  Pt will demo  proper and safe lifting with hip hinge techniques.  Baseline:  Goal status: INITIAL   LONG TERM GOALS: Target date: 01/07/2024    Pt will be I with HEP upon discharge  Baseline:  Goal status: INITIAL  2.  Pt will be able to tolerate standing for 30 min at a time without increasing pain Baseline:  Goal status: INITIAL  3.  Pt will be able to load dishwasher, pull out laundry without increased pain in back  Baseline:  Goal status: INITIAL  4.  Pt will be able to report no increased neck pain with driving, turning head  Baseline:  Goal status: INITIAL  5.  Pt will begin walking program for improved bone density 3 x per week x 30 min  Baseline:  Goal status: INITIAL  6.  Pt. Will achieve a FOTO score of 61% as to demonstrate improvement in self-perceived functional ability with daily activities. (NRP 12/18/2023)   Baseline: 51% Goal status: INITIAL  PLAN:  PT FREQUENCY: 1x/week  PT DURATION: 8 weeks  PLANNED INTERVENTIONS: 97164- PT Re-evaluation, 97110-Therapeutic exercises, 97530- Therapeutic activity, [id number]- Neuromuscular re-education, 97535- Self Care, [id number]- Manual therapy, Patient/Family education, and Spinal mobilization.  PLAN FOR NEXT SESSION: Consider Pilates . Review HEP, Review postural cues. Progress as tolerated, posture, NEUTRAL core   Delon Norma, PT 12/25/23 12:06 PM Phone: [phone] Fax: [phone]

## 2023-12-25 ENCOUNTER — Ambulatory Visit: Payer: HMO | Admitting: Physical Therapy

## 2023-12-25 ENCOUNTER — Encounter: Payer: Self-pay | Admitting: Physical Therapy

## 2023-12-25 DIAGNOSIS — R29898 Other symptoms and signs involving the musculoskeletal system: Secondary | ICD-10-CM

## 2023-12-25 DIAGNOSIS — M5459 Other low back pain: Secondary | ICD-10-CM

## 2023-12-25 DIAGNOSIS — M542 Cervicalgia: Secondary | ICD-10-CM

## 2024-01-01 ENCOUNTER — Encounter: Payer: Self-pay | Admitting: Physical Therapy

## 2024-01-01 ENCOUNTER — Ambulatory Visit: Payer: HMO | Admitting: Physical Therapy

## 2024-01-01 DIAGNOSIS — M542 Cervicalgia: Secondary | ICD-10-CM

## 2024-01-01 DIAGNOSIS — M5459 Other low back pain: Secondary | ICD-10-CM

## 2024-01-01 DIAGNOSIS — R29898 Other symptoms and signs involving the musculoskeletal system: Secondary | ICD-10-CM

## 2024-01-01 NOTE — Therapy (Addendum)
 OUTPATIENT PHYSICAL THERAPY NOTE   Patient Name: [name] [name] MRN: [id number] DOB:1956-02-21, 68 y.o., female Today's Date: 01/01/2024  END OF SESSION:  PT End of Session - 01/01/24 1110     Visit Number 4    Number of Visits 8    Date for PT Re-Evaluation 01/07/24    PT Start Time 1106   END TIME                                                                 1146            Past Medical History:  Diagnosis Date   Anxiety    Arthritis    HANDS,  KNEES   Bone tumor (benign)    RIGHT FEMUR   Borderline hypertension    Female pelvic pain    Fibromyalgia    Left knee injury    TENDONDINITIS/  CHONDRAMALIA   PONV (postoperative nausea and vomiting)    Past Surgical History:  Procedure Laterality Date   DILATION AND CURETTAGE OF UTERUS  1986  &  1988   RETAINED PLACENTA   jaw tumor Right 2020   right lower jaw tumor removed 2020 by Dr. Retta Mac   LAPAROSCOPIC ASSISTED VAGINAL HYSTERECTOMY  01-25-2003   LAPAROSCOPY N/A 04/18/2014   Procedure: LAPAROSCOPY DIAGNOSTIC;  Surgeon: Meriel Pica, MD;  Location: [place];  Service: Gynecology;  Laterality: N/A;   NASAL SEPTUM SURGERY  AGE 39   OPEN REDUCTION INTERNAL FIXATION (ORIF) DISTAL RADIAL FRACTURE Left 09/18/2020   Procedure: OPEN TREATMENT OF LEFT DISTAL RADIUS FRACTURE;  Surgeon: Mack Hook, MD;  [facility];  Service: Orthopedics;  Laterality: Left;  LENGTH OF SURGERY: 75 MIN  PRE OP BLOCK   SALPINGOOPHORECTOMY Bilateral 04/18/2014   Procedure: SALPINGO OOPHORECTOMY;  Surgeon: Meriel Pica, MD;  Location: [facility];  Service: Gynecology;  Laterality: Bilateral;   TONSILLECTOMY  AGE 44   TUBAL LIGATION     Patient Active Problem List   Diagnosis Date Noted   COVID-19 12/04/2023   Eczema 11/25/2023   Spinal stenosis at L4-L5 level 02/26/2023   Neuroforaminal stenosis of lumbar spine 02/26/2023   Hyperlipidemia 12/25/2021   Scoliosis deformity  of spine 04/24/2021   Degeneration of spine 04/24/2021   Anxiety 04/24/2021   Osteoporosis 09/02/2019   Adjustment disorder with mixed anxiety and depressed mood 07/04/2015   Allergic rhinitis 07/04/2015   Essential hypertension 03/24/2015   Tobacco abuse 09/01/2012   Fibromyalgia    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    PCP: Herbie Drape MD   REFERRING PROVIDER: Clementeen Graham   REFERRING DIAG: M81.0 (ICD-10-CM) - Age-related osteoporosis without current pathological fracture M48.061 (ICD-10-CM) - Spinal stenosis at L4-L5 level  Rationale for Evaluation and Treatment: Rehabilitation  THERAPY DIAG:  Other low back pain  Other symptoms and signs involving the musculoskeletal system  Cervicalgia  ONSET DATE: chronic   SUBJECTIVE:  SUBJECTIVE STATEMENT: Pain in neck.  That's always.  Has some soreness in her L side.  I brought the exercises.     PERTINENT HISTORY:  MD notes: Additionally she notes chronic low back pain with pain radiating down both legs. This problem has been well addressed by Dr. Madelon Lips in [facility]. She has had a trial of physical therapy and oral NSAIDs which did not work and made her feel worse. She did see Dr. Maurice Small who recommended back injections after her lumbar spine MRI showing spinal stenosis. She has not yet proceeded with back injections as she was worried about side effects.  Knee pain, fibro Neck pain  Wrist fx   PAIN:  Are you having pain? Yes: NPRS scale: 3/10 Pain location: neck  Pain description: tight, sore, achy  Aggravating factors: pulling, lifting  Relieving factors: sitting, leaning forward, recliner   PRECAUTIONS: Other: Osteoporosis   RED FLAGS: None   WEIGHT BEARING RESTRICTIONS: No  FALLS:  Has  patient fallen in last 6 months? 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  DIAGNOSTIC FINDINGS:  Spine osteopenia T score -2.5 -2.7 in hips. Spine -1.7   PATIENT SURVEYS:  NT on eval  FOTO taken on 2nd visit (12/17/2022): Current: 51%, Expected: 61%  SCREENING FOR RED FLAGS: Bowel or bladder incontinence: No Spinal tumors: No Cauda equina syndrome: No Compression fracture: No Abdominal aneurysm: No  COGNITION: Overall cognitive status: Within functional limits for tasks assessed     SENSATION: WFL  MUSCLE LENGTH: Hamstrings: WNL  Thomas test: WNL Tight quads L > R    POSTURE: rounded shoulders and forward head  PALPATION: Pain in low lumbar region bilateral L4-L5 R> L .  Pain in gluteals , soreness.   LUMBAR ROM:   AROM eval  Flexion NT  Extension 50% limited pain  Right lateral flexion WNL pain   Left lateral flexion WNL   Right rotation 25%   Left rotation 25% pain on Rt    (Blank rows = not tested)  LOWER EXTREMITY ROM:   WNL   Passive  Right eval Left eval  Hip flexion    Hip extension    Hip abduction    Hip adduction    Hip internal rotation    Hip external rotation    Knee flexion    Knee extension    Ankle dorsiflexion    Ankle plantarflexion    Ankle inversion    Ankle eversion     (Blank rows = not tested)  LOWER EXTREMITY MMT:    MMT Right eval Left eval  Hip flexion 5 5  Hip extension    Hip abduction 4- 5  Hip adduction    Hip internal rotation    Hip external rotation    Knee flexion 4+ 5  Knee extension 4+ 5  Ankle dorsiflexion    Ankle plantarflexion    Ankle  inversion    Ankle eversion     (Blank rows = not tested)  LUMBAR SPECIAL TESTS:  Straight leg raise test: Negative and NT  FUNCTIONAL TESTS:  5 times sit to stand: 18 sec  Hinge  vs squat   GAIT: Distance walked: 150 Assistive device utilized: None Level of assistance: Modified independence Comments: no deviations    Today's Treatment  [facility] Adult PT Treatment:                                                DATE: 01/01/24 Therapeutic Exercise: NuStep L6 UE and LE for 5 min  Squats at bar x 15  Airex pad balance and strength  High knees x 10 each  Hip abduction and hip extension x 15 each  Supine ball bridges x 10 x 2 sets legs on the ball  Hamstring curls with ball  Small ROM lower trunk rotation x 10 x 2 with and without the ball  SLR x 10 toes up and toes out  SL hip abduction x 10 x 2   [facility] Adult PT Treatment:                                                DATE: 12/25/23 Therapeutic Exercise: NuStep L6 UE and LE for 6 min  Supine bridge x 10 Small ROM lower trunk rotation x 10 SLR x 10  SL hip abduction x 10  Sit to stand x 10  Standing march with 10 lbs wgt LAQ 5 lbs with ball squeeze  Wall sit with scap retraction  x 10   [facility] Adult PT Treatment:                                                DATE: 12/18/2023  Therapeutic Exercise: Treadmill 5', 0.13mph Standing marches with UE assist for neutral spine, 2x12 (2s hold) Supine Bridge with bracing 2x12 (2s hold) Standing hip ABD w/UE support for neutral spine 2x8, 2#  Standing hip ext w/UE support for neutral spine 2x8, 2# Therapeutic Activity: Supine abdominal bracing  STS with cue for hip hinge and neutral spine 2x12  Self care: POC, Osteoporosis precautions, flexion vs extension, anatomy, MRI review, posture, importance of core and muscle strength    PATIENT EDUCATION:  Education details: see above  Person educated: Patient Education method: Explanation, Demonstration, Verbal cues, and  Handouts Education comprehension: verbalized understanding and needs further education  HOME EXERCISE PROGRAM: Access Code: [email or web address] URL: [email or web address] Date: 12/25/2023 Prepared by: Karie Mainland  Exercises - Beginner Bridge  - 1 x daily - 7 x weekly - 2 sets - 10 reps - 5 hold - Sidelying Hip Abduction  - 1 x daily - 7 x weekly - 2 sets - 10 reps - 5 hold - Sit to Stand Without Arm Support  - 1 x daily - 7 x weekly - 2 sets - 10 reps - 5 hold - Wall Quarter Squat  - 1 x daily - 7 x weekly - 2 sets - 10 reps - 0-10 hold - Gastroc Stretch on Wall  - 1 x daily - 7 x weekly - 1 sets - 3 reps - 30 hold - Standing Marching  - 1 x daily - 7 x weekly - 2 sets - 10 reps   ASSESSMENT:  CLINICAL IMPRESSION:  Patient educated on pain vs soreness and discomfort throughout session.  Pt able to perform her exercises with min cues.  She brought in a stack of exercises from years past. I encouraged her to find a group of exercises that works for her as long as she keeps a neutral spine and works on strength, walking she is safe. I added a couple of ball exercises she could integrate as well as she will be getting a ball from her sister. She has 1 more visit scheduled which may be all she needs.    FOTO outcome measure was taken and recorded in obj.  Eval impression 11/12/2023: Patient is a 68 y.o. female  who was seen today for physical therapy evaluation and treatment for back pain (neck, lower) due to spinal stenosis in the presence of osteoporosis. She will focus on core and strengthening with hopes of transitioning to community exercises   OBJECTIVE IMPAIRMENTS: decreased activity tolerance, decreased mobility, difficulty walking, decreased ROM, decreased strength, increased fascial restrictions, impaired flexibility, improper body mechanics, and pain.   ACTIVITY LIMITATIONS: carrying, lifting, bending, standing, squatting, sleeping, stairs, and locomotion  level  PARTICIPATION LIMITATIONS: meal prep, cleaning, laundry, interpersonal relationship, shopping, and community activity  PERSONAL FACTORS: Past/current experiences, Time since onset of injury/illness/exacerbation, and 1-2 comorbidities: fibromyalgia, knee pain   are also affecting patient's functional outcome.   REHAB POTENTIAL: Excellent  CLINICAL DECISION MAKING: Stable/uncomplicated  EVALUATION COMPLEXITY: Low   GOALS: Goals reviewed with patient? Yes  SHORT TERM GOALS: Target date: 12/10/2023    Pt will be I HEP for strength  Baseline: Goal status: met   2.  FOTO will be completed and goal set  Baseline:  Goal status: met   3.  Pt will demo proper and safe lifting with hip hinge techniques.  Baseline:  Goal status:met    LONG TERM GOALS: Target date: 01/07/2024    Pt will be I with HEP upon discharge  Baseline:  Goal status: INITIAL  2.  Pt will be able to tolerate standing for 30 min at a time without increasing pain Baseline:  Goal status: INITIAL  3.  Pt will be able to load dishwasher, pull out laundry without increased pain in back  Baseline:  Goal status: INITIAL  4.  Pt will be able to report no increased neck pain with driving, turning head  Baseline:  Goal status: INITIAL  5.  Pt will begin walking program for improved bone density 3 x per week x 30 min  Baseline:  Goal status: INITIAL  6.  Pt. Will achieve a FOTO score of 61% as to demonstrate improvement in self-perceived functional ability with daily activities. (NRP 12/18/2023)   Baseline: 51% Goal status: INITIAL  PLAN:  PT FREQUENCY: 1x/week  PT DURATION: 8 weeks  PLANNED INTERVENTIONS: 97164- PT Re-evaluation, 97110-Therapeutic exercises, 97530- Therapeutic activity, [id number]- Neuromuscular re-education, 97535- Self Care, 82956- Manual therapy, Patient/Family education, and Spinal mobilization.  PLAN FOR NEXT SESSION: Pt would like to cont POC and will be renewed for another few  weeks once goals assessed.    Karie Mainland, PT 01/01/24 1:02 PM Phone: [phone] Fax: [phone]

## 2024-01-08 ENCOUNTER — Encounter: Payer: Self-pay | Admitting: Physical Therapy

## 2024-01-08 ENCOUNTER — Ambulatory Visit: Payer: HMO | Admitting: Physical Therapy

## 2024-01-08 DIAGNOSIS — M5459 Other low back pain: Secondary | ICD-10-CM

## 2024-01-08 DIAGNOSIS — M542 Cervicalgia: Secondary | ICD-10-CM

## 2024-01-08 DIAGNOSIS — R29898 Other symptoms and signs involving the musculoskeletal system: Secondary | ICD-10-CM

## 2024-01-08 NOTE — Therapy (Signed)
OUTPATIENT PHYSICAL THERAPY NOTE   Patient Name: [name] [name] MRN: [id number] DOB:04-Jul-1956, 68 y.o., female Today's Date: 01/08/2024  END OF SESSION:  PT End of Session - 01/08/24 1109     Visit Number 5    Number of Visits 11    Date for PT Re-Evaluation 02/19/24    Authorization Type [employer]    PT Start Time 1102    PT Stop Time 1145    PT Time Calculation (min) 43 min    Activity Tolerance Patient tolerated treatment well    Behavior During Therapy WFL for tasks assessed/performed                Past Medical History:  Diagnosis Date   Anxiety    Arthritis    HANDS,  KNEES   Bone tumor (benign)    RIGHT FEMUR   Borderline hypertension    Female pelvic pain    Fibromyalgia    Left knee injury    TENDONDINITIS/  CHONDRAMALIA   PONV (postoperative nausea and vomiting)    Past Surgical History:  Procedure Laterality Date   DILATION AND CURETTAGE OF UTERUS  1986  &  1988   RETAINED PLACENTA   jaw tumor Right 2020   right lower jaw tumor removed 2020 by Dr. Retta Mac   LAPAROSCOPIC ASSISTED VAGINAL HYSTERECTOMY  01-25-2003   LAPAROSCOPY N/A 04/18/2014   Procedure: LAPAROSCOPY DIAGNOSTIC;  Surgeon: Meriel Pica, MD;  Location: [facility] [place];  Service: Gynecology;  Laterality: N/A;   NASAL SEPTUM SURGERY  AGE 30   OPEN REDUCTION INTERNAL FIXATION (ORIF) DISTAL RADIAL FRACTURE Left 09/18/2020   Procedure: OPEN TREATMENT OF LEFT DISTAL RADIUS FRACTURE;  Surgeon: Mack Hook, MD;  Location: [facility];  Service: Orthopedics;  Laterality: Left;  LENGTH OF SURGERY: 75 MIN  PRE OP BLOCK   SALPINGOOPHORECTOMY Bilateral 04/18/2014   Procedure: SALPINGO OOPHORECTOMY;  Surgeon: Meriel Pica, MD;  Location: [facility];  Service: Gynecology;  Laterality: Bilateral;   TONSILLECTOMY  AGE 38   TUBAL LIGATION     Patient Active Problem List   Diagnosis Date Noted   COVID-19 12/04/2023   Eczema 11/25/2023    Spinal stenosis at L4-L5 level 02/26/2023   Neuroforaminal stenosis of lumbar spine 02/26/2023   Hyperlipidemia 12/25/2021   Scoliosis deformity of spine 04/24/2021   Degeneration of spine 04/24/2021   Anxiety 04/24/2021   Osteoporosis 09/02/2019   Adjustment disorder with mixed anxiety and depressed mood 07/04/2015   Allergic rhinitis 07/04/2015   Essential hypertension 03/24/2015   Tobacco abuse 09/01/2012   Fibromyalgia    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    PCP: Herbie Drape MD   REFERRING PROVIDER: Clementeen Graham   REFERRING DIAG: M81.0 (ICD-10-CM) - Age-related osteoporosis without current pathological fracture M48.061 (ICD-10-CM) - Spinal stenosis at L4-L5 level  Rationale for Evaluation and Treatment: Rehabilitation  THERAPY DIAG:  Other low back pain  Other symptoms and signs involving the musculoskeletal system  Cervicalgia  ONSET DATE: chronic   SUBJECTIVE:  SUBJECTIVE STATEMENT: Pain is the usual.  Neck and low back 5/10 each side    PERTINENT HISTORY:  MD notes: Additionally she notes chronic low back pain with pain radiating down both legs. This problem has been well addressed by Dr. Madelon Lips in [facility]. She has had a trial of physical therapy and oral NSAIDs which did not work and made her feel worse. She did see Dr. Maurice Small who recommended back injections after her lumbar spine MRI showing spinal stenosis. She has not yet proceeded with back injections as she was worried about side effects.  Knee pain, fibro Neck pain  Wrist fx   PAIN:  Are you having pain? Yes: NPRS scale: 5/10 Pain location: neck and back   Pain description: tight, sore, achy  Aggravating factors: pulling, lifting  Relieving factors: sitting, leaning forward,  recliner   PRECAUTIONS: Other: Osteoporosis   RED FLAGS: None   WEIGHT BEARING RESTRICTIONS: No  FALLS:  Has patient fallen in last 6 months? 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  DIAGNOSTIC FINDINGS:  Spine osteopenia T score -2.5 -2.7 in hips. Spine -1.7   PATIENT SURVEYS:  NT on eval  FOTO taken on 2nd visit (12/17/2022): Current: 51%, 53 % on renewal  Expected: 61%  SCREENING FOR RED FLAGS: Bowel or bladder incontinence: No Spinal tumors: No Cauda equina syndrome: No Compression fracture: No Abdominal aneurysm: No  COGNITION: Overall cognitive status: Within functional limits for tasks assessed     SENSATION: WFL  MUSCLE LENGTH: Hamstrings: WNL  Thomas test: WNL Tight quads L > R    POSTURE: rounded shoulders and forward head  PALPATION: Pain in low lumbar region bilateral L4-L5 R> L .  Pain in gluteals , soreness.   LUMBAR ROM:   AROM eval 01/08/24  Flexion NT NT   Extension 50% limited pain 50% with pain   Right lateral flexion WNL pain  Min pain 25% limited   Left lateral flexion WNL  Min pain 25%   Right rotation 25%  25% limited pain   Left rotation 25% pain on Rt  Pain, 50%    (Blank rows = not tested)  LOWER EXTREMITY ROM:   WNL   Passive  Right eval Left eval  Hip flexion    Hip extension    Hip abduction    Hip adduction    Hip internal rotation    Hip external rotation    Knee flexion    Knee extension    Ankle dorsiflexion    Ankle plantarflexion    Ankle inversion    Ankle eversion     (Blank rows = not tested)  LOWER EXTREMITY MMT:    MMT Right eval Left eval   Hip flexion 5 5  Hip extension    Hip abduction 4- 5  Hip adduction    Hip internal rotation    Hip external rotation    Knee flexion 4+ 5  Knee extension 4+ 5  Ankle dorsiflexion    Ankle plantarflexion    Ankle inversion    Ankle eversion     (Blank rows = not tested)  LUMBAR  SPECIAL TESTS:  Straight leg raise test: Negative and NT  FUNCTIONAL TESTS:  5 times sit to stand: 18 sec  Hinge vs squat    01/08/24: 18.5 sec  GAIT: Distance walked: 150 Assistive device utilized: None Level of assistance: Modified independence Comments: no deviations    Today's Treatment  [facility] Adult PT Treatment:                                                DATE: 01/08/24 Therapeutic Exercise: NuStep 5 min  5 x STS Hip abduction 1 x 20 each side 2 lbs  Heel raises 2 bs cuff wgt  Shoulder extension green band x 15  Row x 15  Hip abduction 2 lbs sidelying  x 2 x 10  SLR 1 x 10 , 2 lbs cuff  Therapeutic Activity  Goal check, cervical rotation, trunk ROM, lifting and posture    [facility] Adult PT Treatment:                                                DATE: 01/01/24 Therapeutic Exercise: NuStep L6 UE and LE for 5 min  Squats at bar x 15  Airex pad balance and strength  High knees x 10 each  Hip abduction and hip extension x 15 each  Supine ball bridges x 10 x 2 sets legs on the ball  Hamstring curls with ball  Small ROM lower trunk rotation x 10 x 2 with and without the ball  SLR x 10 toes up and toes out  SL hip abduction x 10 x 2   [facility] Adult PT Treatment:                                                DATE: 12/25/23 Therapeutic Exercise: NuStep L6 UE and LE for 6 min  Supine bridge x 10 Small ROM lower trunk rotation x 10 SLR x 10  SL hip abduction x 10  Sit to stand x 10  Standing march with 10 lbs wgt LAQ 5 lbs with ball squeeze  Wall sit with scap retraction  x 10   [facility] Adult PT Treatment:                                                DATE: 12/18/2023  Therapeutic  Exercise: Treadmill 5', 0.54mph Standing marches with UE assist for neutral spine, 2x12 (2s hold) Supine Bridge with bracing 2x12 (2s hold) Standing hip ABD w/UE support for neutral spine 2x8, 2#  Standing hip ext w/UE support for neutral spine 2x8, 2# Therapeutic Activity: Supine abdominal bracing  STS with cue for hip hinge and neutral spine 2x12  Self care: POC, Osteoporosis precautions, flexion vs extension, anatomy, MRI review, posture, importance of core and muscle strength    PATIENT EDUCATION:  Education details: see above  Person educated: Patient Education method: Explanation, Demonstration, Verbal cues, and Handouts Education comprehension: verbalized understanding and needs further education  HOME EXERCISE PROGRAM: Access Code: [email or web address] URL: [email or web address] Date: 01/08/2024 Prepared by: Karie Mainland  Exercises - Beginner Bridge  - 1 x daily - 7 x weekly - 2 sets - 10 reps - 5 hold - Sidelying Hip Abduction  - 1 x daily - 7 x weekly - 2 sets - 10 reps - 5 hold - Sit to Stand Without Arm Support  - 1 x daily - 7 x weekly - 2 sets - 10 reps - 5 hold - Wall Quarter Squat  - 1 x daily - 7 x weekly - 2 sets - 10 reps - 0-10 hold - Gastroc Stretch on Wall  - 1 x daily - 7 x weekly - 1 sets - 3 reps - 30 hold - Standing Marching  - 1 x daily - 7 x weekly - 2 sets - 10 reps - Standing Hip Abduction with Counter Support  - 1 x daily - 7 x weekly - 2 sets - 15 reps - 2 hold - Shoulder extension with resistance - Neutral  - 1 x daily - 7 x weekly - 2 sets - 10 reps - 5 hold - Standing Shoulder Row with Anchored Resistance  - 1 x daily - 7 x weekly - 2 sets - 10 reps - 5 hold  ASSESSMENT:  CLINICAL IMPRESSION: Patient was renewed for more visits to further improve her posture and mobility.  She not made improvements in her trunk motion, pain today.  She expressed desire to continue therapy, has only completed 5 visits.  She needs encouragement to be more  active in general but she is limited due to back knee and neck pain .  Added 2 pound cuff weights to her lower extremity for exercising and she tolerated it well.  She will continue to benefit from skilled physical therapy in order to educate on activity, building bone density and  importance of functional strength.     FOTO outcome measure was taken and recorded in obj.  Eval impression 11/12/2023: Patient is a 68 y.o. female  who was seen today for physical therapy evaluation and treatment for back pain (neck, lower) due to spinal stenosis in the presence of osteoporosis. She will focus on core and strengthening with hopes of transitioning to community exercises   OBJECTIVE IMPAIRMENTS: decreased activity tolerance, decreased mobility, difficulty walking, decreased ROM, decreased strength, increased fascial restrictions, impaired flexibility, improper body mechanics, and pain.   ACTIVITY LIMITATIONS: carrying, lifting, bending, standing, squatting, sleeping, stairs, and locomotion level  PARTICIPATION LIMITATIONS: meal prep, cleaning, laundry, interpersonal relationship, shopping, and community activity  PERSONAL FACTORS: Past/current experiences, Time since onset of injury/illness/exacerbation, and 1-2 comorbidities: fibromyalgia, knee pain   are also affecting patient's functional outcome.   REHAB POTENTIAL: Excellent  CLINICAL DECISION MAKING: Stable/uncomplicated  EVALUATION COMPLEXITY: Low   GOALS: Goals reviewed with patient? Yes  SHORT TERM GOALS: Target date: 12/10/2023    Pt will be I HEP for strength  Baseline: Goal status: met   2.  FOTO will be completed and goal set  Baseline:  Goal status: met   3.  Pt will demo proper and safe lifting with hip hinge techniques.  Baseline:  Goal status:met    LONG TERM GOALS: Target date: 01/07/2024    Pt will be I with HEP upon discharge  Baseline:  Goal status: ongoing  2.  Pt will be able to tolerate standing for 30  min at a time without increasing pain Baseline: 01/08/24: stand to cook with min  pain , standing still is hard  Goal status: ongoing  3.  Pt will be able to load dishwasher, pull out laundry without increased pain in back  Baseline: 01/08/24: with careful thought  Goal status: ongoing   4.  Pt will be able to report no increased neck pain with driving, turning head  Baseline: 01/08/24 painful .  Lt 60 deg Rt 50 deg  Goal status: ongoing   5.  Pt will begin walking program for improved bone density 3 x per week x 30 min  Baseline: 01/08/24: has not done because of cold and didn't find out about [employer]  Goal status: ongoing   6.  Pt. Will achieve a FOTO score of 61% as to demonstrate improvement in self-perceived functional ability with daily activities. (NRP 12/18/2023)   Baseline: 51%, 54%  Goal status: ongoing   PLAN:  PT FREQUENCY: 1x/week  PT DURATION: 6 weeks  PLANNED INTERVENTIONS: 97164- PT Re-evaluation, 97110-Therapeutic exercises, 97530- Therapeutic activity, [id number]- Neuromuscular re-education, 97535- Self Care, 16109- Manual therapy, Patient/Family education, and Spinal mobilization.  PLAN FOR NEXT SESSION: Gentle strength and Posture . Check new HEP (bands)   Karie Mainland, PT 01/08/24 2:50 PM Phone: [phone] Fax: [phone]

## 2024-01-14 ENCOUNTER — Ambulatory Visit: Payer: HMO | Admitting: Physical Therapy

## 2024-01-14 ENCOUNTER — Encounter: Payer: Self-pay | Admitting: Physical Therapy

## 2024-01-14 DIAGNOSIS — R29898 Other symptoms and signs involving the musculoskeletal system: Secondary | ICD-10-CM

## 2024-01-14 DIAGNOSIS — M5459 Other low back pain: Secondary | ICD-10-CM | POA: Diagnosis not present

## 2024-01-14 NOTE — Therapy (Signed)
OUTPATIENT PHYSICAL THERAPY NOTE   Patient Name: [name] [name] MRN: [id number] DOB:[birth year], 68 y.o., female Today's Date: 01/14/2024  END OF SESSION:  PT End of Session - 01/14/24 1220     Visit Number 6    Number of Visits 11    Date for PT Re-Evaluation 02/19/24    Authorization Type [employer]    PT Start Time 1215    PT Stop Time 1300    PT Time Calculation (min) 45 min    Activity Tolerance Patient tolerated treatment well    Behavior During Therapy WFL for tasks assessed/performed                 Past Medical History:  Diagnosis Date   Anxiety    Arthritis    HANDS,  KNEES   Bone tumor (benign)    RIGHT FEMUR   Borderline hypertension    Female pelvic pain    Fibromyalgia    Left knee injury    TENDONDINITIS/  CHONDRAMALIA   PONV (postoperative nausea and vomiting)    Past Surgical History:  Procedure Laterality Date   DILATION AND CURETTAGE OF UTERUS  1986  &  1988   RETAINED PLACENTA   jaw tumor Right 2020   right lower jaw tumor removed 2020 by Dr. Retta Mac   LAPAROSCOPIC ASSISTED VAGINAL HYSTERECTOMY  01-25-2003   LAPAROSCOPY N/A 04/18/2014   Procedure: LAPAROSCOPY DIAGNOSTIC;  Surgeon: Meriel Pica, MD;  Location: [facility];  Service: Gynecology;  Laterality: N/A;   NASAL SEPTUM SURGERY  AGE 69   OPEN REDUCTION INTERNAL FIXATION (ORIF) DISTAL RADIAL FRACTURE Left 09/18/2020   Procedure: OPEN TREATMENT OF LEFT DISTAL RADIUS FRACTURE;  Surgeon: Mack Hook, MD;  Location: [facility];  Service: Orthopedics;  Laterality: Left;  LENGTH OF SURGERY: 75 MIN  PRE OP BLOCK   SALPINGOOPHORECTOMY Bilateral 04/18/2014   Procedure: SALPINGO OOPHORECTOMY;  Surgeon: Meriel Pica, MD;  Location: [facility];  Service: Gynecology;  Laterality: Bilateral;   TONSILLECTOMY  AGE 73   TUBAL LIGATION     Patient Active Problem List   Diagnosis Date Noted   COVID-19 12/04/2023   Eczema 11/25/2023    Spinal stenosis at L4-L5 level 02/26/2023   Neuroforaminal stenosis of lumbar spine 02/26/2023   Hyperlipidemia 12/25/2021   Scoliosis deformity of spine 04/24/2021   Degeneration of spine 04/24/2021   Anxiety 04/24/2021   Osteoporosis 09/02/2019   Adjustment disorder with mixed anxiety and depressed mood 07/04/2015   Allergic rhinitis 07/04/2015   Essential hypertension 03/24/2015   Tobacco abuse 09/01/2012   Fibromyalgia    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    PCP: Herbie Drape MD   REFERRING PROVIDER: Clementeen Graham   REFERRING DIAG: M81.0 (ICD-10-CM) - Age-related osteoporosis without current pathological fracture M48.061 (ICD-10-CM) - Spinal stenosis at L4-L5 level  Rationale for Evaluation and Treatment: Rehabilitation  THERAPY DIAG:  Other low back pain  Other symptoms and signs involving the musculoskeletal system  ONSET DATE: chronic   SUBJECTIVE:  SUBJECTIVE STATEMENT: Pt  stated her back is feeling pretty decent today, has been good with the exercises at home, "except for one where the band is in the door"   PERTINENT HISTORY:  MD notes: Additionally she notes chronic low back pain with pain radiating down both legs. This problem has been well addressed by Dr. Madelon Lips in [facility]. She has had a trial of physical therapy and oral NSAIDs which did not work and made her feel worse. She did see Dr. Maurice Small who recommended back injections after her lumbar spine MRI showing spinal stenosis. She has not yet proceeded with back injections as she was worried about side effects.  Knee pain, fibro Neck pain  Wrist fx   PAIN:  Are you having pain? Yes: NPRS scale: 5/10 Pain location: neck and back   Pain description: tight, sore, achy  Aggravating  factors: pulling, lifting  Relieving factors: sitting, leaning forward, recliner   PRECAUTIONS: Other: Osteoporosis   RED FLAGS: None   WEIGHT BEARING RESTRICTIONS: No  FALLS:  Has patient fallen in last 6 months? 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  DIAGNOSTIC FINDINGS:  Spine osteopenia T score -2.5 -2.7 in hips. Spine -1.7   PATIENT SURVEYS:  NT on eval  FOTO taken on 2nd visit (12/17/2022): Current: 51%, 53 % on renewal  Expected: 61%  SCREENING FOR RED FLAGS: Bowel or bladder incontinence: No Spinal tumors: No Cauda equina syndrome: No Compression fracture: No Abdominal aneurysm: No  COGNITION: Overall cognitive status: Within functional limits for tasks assessed     SENSATION: WFL  MUSCLE LENGTH: Hamstrings: WNL  Thomas test: WNL Tight quads L > R    POSTURE: rounded shoulders and forward head  PALPATION: Pain in low lumbar region bilateral L4-L5 R> L .  Pain in gluteals , soreness.   LUMBAR ROM:   AROM eval 01/08/24  Flexion NT NT   Extension 50% limited pain 50% with pain   Right lateral flexion WNL pain  Min pain 25% limited   Left lateral flexion WNL  Min pain 25%   Right rotation 25%  25% limited pain   Left rotation 25% pain on Rt  Pain, 50%    (Blank rows = not tested)  LOWER EXTREMITY ROM:   WNL   Passive  Right eval Left eval  Hip flexion    Hip extension    Hip abduction    Hip adduction    Hip internal rotation    Hip external rotation    Knee flexion    Knee extension    Ankle dorsiflexion    Ankle plantarflexion    Ankle inversion    Ankle eversion     (Blank  rows = not tested)  LOWER EXTREMITY MMT:    MMT Right eval Left eval  Hip flexion 5 5  Hip extension    Hip abduction 4- 5  Hip adduction    Hip internal rotation    Hip external rotation    Knee flexion 4+ 5  Knee extension 4+ 5  Ankle dorsiflexion    Ankle plantarflexion    Ankle inversion  Ankle eversion     (Blank rows = not tested)  LUMBAR SPECIAL TESTS:  Straight leg raise test: Negative and NT  FUNCTIONAL TESTS:  5 times sit to stand: 18 sec  Hinge vs squat    01/08/24: 18.5 sec  GAIT: Distance walked: 150 Assistive device utilized: None Level of assistance: Modified independence Comments: no deviations    [facility] Adult PT Treatment:                                                DATE: 01/14/2024  Therapeutic Exercise: Nu step 8' F/e MET Abd/ADD MET Therapeutic Activity: STS with band for axial load 2x8, yellow POC discussion    [facility] Adult PT Treatment:                                                DATE: 01/08/24 Therapeutic Exercise: NuStep 5 min  5 x STS Hip abduction 1 x 20 each side 2 lbs  Heel raises 2 bs cuff wgt  Shoulder extension green band x 15  Row x 15  Hip abduction 2 lbs sidelying  x 2 x 10  SLR 1 x 10 , 2 lbs cuff  Therapeutic Activity  Goal check, cervical rotation, trunk ROM, lifting and posture     Self care: POC, Osteoporosis precautions, flexion vs extension, anatomy, MRI review, posture, importance of core and muscle strength    PATIENT EDUCATION:  Education details: see above  Person educated: Patient Education method: Programmer, multimedia, Demonstration, Verbal cues, and Handouts Education comprehension: verbalized understanding and needs further education  HOME EXERCISE PROGRAM: Access Code: [email or web address] URL: [email or web address] Date: 01/08/2024 Prepared by: Karie Mainland  Exercises - Beginner Bridge  - 1 x daily - 7 x weekly - 2 sets - 10 reps - 5 hold - Sidelying Hip Abduction  - 1 x daily - 7 x weekly - 2  sets - 10 reps - 5 hold - Sit to Stand Without Arm Support  - 1 x daily - 7 x weekly - 2 sets - 10 reps - 5 hold - Wall Quarter Squat  - 1 x daily - 7 x weekly - 2 sets - 10 reps - 0-10 hold - Gastroc Stretch on Wall  - 1 x daily - 7 x weekly - 1 sets - 3 reps - 30 hold - Standing Marching  - 1 x daily - 7 x weekly - 2 sets - 10 reps - Standing Hip Abduction with Counter Support  - 1 x daily - 7 x weekly - 2 sets - 15 reps - 2 hold - Shoulder extension with resistance - Neutral  - 1 x daily - 7 x weekly - 2 sets - 10 reps - 5 hold - Standing Shoulder Row with Anchored Resistance  - 1 x daily - 7 x weekly - 2 sets - 10 reps - 5 hold  ASSESSMENT:  CLINICAL IMPRESSION: Pt attended physical therapy session for continuation of treatment regarding low back pain. Pt showed  great  tolerance to treatment and demonstrated improvement with pain during functional transfers and core engagement with activity. Pt required minimal cues for safe and appropriate performance of today's activities. Continue with therapeutic focus on SI stability, low back  core activation.    Patient was renewed for more visits to further improve her posture and mobility.  She not made improvements in her trunk motion, pain today.  She expressed desire to continue therapy, has only completed 5 visits.  She needs encouragement to be more active in general but she is limited due to back knee and neck pain .  Added 2 pound cuff weights to her lower extremity for exercising and she tolerated it well.  She will continue to benefit from skilled physical therapy in order to educate on activity, building bone density and  importance of functional strength.     FOTO outcome measure was taken and recorded in obj.  Eval impression 11/12/2023: Patient is a 68 y.o. female  who was seen today for physical therapy evaluation and treatment for back pain (neck, lower) due to spinal stenosis in the presence of osteoporosis. She will focus on core  and strengthening with hopes of transitioning to community exercises   OBJECTIVE IMPAIRMENTS: decreased activity tolerance, decreased mobility, difficulty walking, decreased ROM, decreased strength, increased fascial restrictions, impaired flexibility, improper body mechanics, and pain.   ACTIVITY LIMITATIONS: carrying, lifting, bending, standing, squatting, sleeping, stairs, and locomotion level  PARTICIPATION LIMITATIONS: meal prep, cleaning, laundry, interpersonal relationship, shopping, and community activity  PERSONAL FACTORS: Past/current experiences, Time since onset of injury/illness/exacerbation, and 1-2 comorbidities: fibromyalgia, knee pain   are also affecting patient's functional outcome.   REHAB POTENTIAL: Excellent  CLINICAL DECISION MAKING: Stable/uncomplicated  EVALUATION COMPLEXITY: Low   GOALS: Goals reviewed with patient? Yes  SHORT TERM GOALS: Target date: 12/10/2023    Pt will be I HEP for strength  Baseline: Goal status: met   2.  FOTO will be completed and goal set  Baseline:  Goal status: met   3.  Pt will demo proper and safe lifting with hip hinge techniques.  Baseline:  Goal status:met    LONG TERM GOALS: Target date: 01/07/2024    Pt will be I with HEP upon discharge  Baseline:  Goal status: ongoing  2.  Pt will be able to tolerate standing for 30 min at a time without increasing pain Baseline: 01/08/24: stand to cook with min pain , standing still is hard  Goal status: ongoing  3.  Pt will be able to load dishwasher, pull out laundry without increased pain in back  Baseline: 01/08/24: with careful thought  Goal status: ongoing   4.  Pt will be able to report no increased neck pain with driving, turning head  Baseline: 01/08/24 painful .  Lt 60 deg Rt 50 deg  Goal status: ongoing   5.  Pt will begin walking program for improved bone density 3 x per week x 30 min  Baseline: 01/08/24: has not done because of cold and didn't find out about  [employer]ne[employer]  Goal status: ongoing   6.  Pt. Will achieve a FOTO score of 61% as to demonstrate improvement in self-perceived functional ability with daily activities. (NRP 12/18/2023)   Baseline: 51%, 54%  Goal status: ongoing   PLAN:  PT FREQUENCY: 1x/week  PT DURATION: 6 weeks  PLANNED INTERVENTIONS: 97164- PT Re-evaluation, 97110-Therapeutic exercises, 97530- Therapeutic activity, [id number]- Neuromuscular re-education, 97535- Self Care, [id number]- Manual therapy, Patient/Family education, and Spinal mobilization.  PLAN FOR NEXT SESSION: Gentle strength and Posture . Check new HEP (bands)   Sheliah Plane, PT, DPT 01/14/2024, 1:00 PM

## 2024-01-21 ENCOUNTER — Ambulatory Visit: Payer: HMO | Admitting: Physical Therapy

## 2024-01-28 ENCOUNTER — Encounter: Payer: Self-pay | Admitting: Physical Therapy

## 2024-01-28 ENCOUNTER — Ambulatory Visit: Payer: HMO | Attending: Family Medicine | Admitting: Physical Therapy

## 2024-01-28 DIAGNOSIS — M5459 Other low back pain: Secondary | ICD-10-CM

## 2024-01-28 DIAGNOSIS — R29898 Other symptoms and signs involving the musculoskeletal system: Secondary | ICD-10-CM

## 2024-01-28 DIAGNOSIS — M542 Cervicalgia: Secondary | ICD-10-CM | POA: Diagnosis not present

## 2024-01-28 NOTE — Therapy (Signed)
OUTPATIENT PHYSICAL THERAPY NOTE   Patient Name: [name] [name] MRN: [id number] DOB:01-26-1956, 68 y.o., female Today's Date: 01/28/2024  END OF SESSION:  PT End of Session - 01/28/24 1127     Visit Number 7    Number of Visits 11    Date for PT Re-Evaluation 02/19/24    PT Start Time 1130    PT Stop Time 1215    PT Time Calculation (min) 45 min    Activity Tolerance Patient tolerated treatment well    Behavior During Therapy WFL for tasks assessed/performed                  Past Medical History:  Diagnosis Date   Anxiety    Arthritis    HANDS,  KNEES   Bone tumor (benign)    RIGHT FEMUR   Borderline hypertension    Female pelvic pain    Fibromyalgia    Left knee injury    TENDONDINITIS/  CHONDRAMALIA   PONV (postoperative nausea and vomiting)    Past Surgical History:  Procedure Laterality Date   DILATION AND CURETTAGE OF UTERUS  1986  &  1988   RETAINED PLACENTA   jaw tumor Right 2020   right lower jaw tumor removed 2020 by Dr. Retta Mac   LAPAROSCOPIC ASSISTED VAGINAL HYSTERECTOMY  01-25-2003   LAPAROSCOPY N/A 04/18/2014   Procedure: LAPAROSCOPY DIAGNOSTIC;  Surgeon: Meriel Pica, MD;  Location: [facility];  Service: Gynecology;  Laterality: N/A;   NASAL SEPTUM SURGERY  AGE 77   OPEN REDUCTION INTERNAL FIXATION (ORIF) DISTAL RADIAL FRACTURE Left 09/18/2020   Procedure: OPEN TREATMENT OF LEFT DISTAL RADIUS FRACTURE;  Surgeon: Mack Hook, MD;  Location: [facility];  Service: Orthopedics;  Laterality: Left;  LENGTH OF SURGERY: 75 MIN  PRE OP BLOCK   SALPINGOOPHORECTOMY Bilateral 04/18/2014   Procedure: SALPINGO OOPHORECTOMY;  Surgeon: Meriel Pica, MD;  Location: [facility];  Service: Gynecology;  Laterality: Bilateral;   TONSILLECTOMY  AGE 75   TUBAL LIGATION     Patient Active Problem List   Diagnosis Date Noted   COVID-19 12/04/2023   Eczema 11/25/2023   Spinal stenosis at L4-L5 level 02/26/2023    Neuroforaminal stenosis of lumbar spine 02/26/2023   Hyperlipidemia 12/25/2021   Scoliosis deformity of spine 04/24/2021   Degeneration of spine 04/24/2021   Anxiety 04/24/2021   Osteoporosis 09/02/2019   Adjustment disorder with mixed anxiety and depressed mood 07/04/2015   Allergic rhinitis 07/04/2015   Essential hypertension 03/24/2015   Tobacco abuse 09/01/2012   Fibromyalgia    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    PCP: Herbie Drape MD   REFERRING PROVIDER: Clementeen Graham   REFERRING DIAG: M81.0 (ICD-10-CM) - Age-related osteoporosis without current pathological fracture M48.061 (ICD-10-CM) - Spinal stenosis at L4-L5 level  Rationale for Evaluation and Treatment: Rehabilitation  THERAPY DIAG:  Other low back pain  Other symptoms and signs involving the musculoskeletal system  ONSET DATE: chronic   SUBJECTIVE:  SUBJECTIVE STATEMENT: Pt attended therapy today stating that her back feels a little bit more tight than usual, thinking because of the weather today. Pt stated she feels therapy is helping overall and likes that she is learning what she can do at home safely    PERTINENT HISTORY:  MD notes: Additionally she notes chronic low back pain with pain radiating down both legs. This problem has been well addressed by Dr. Madelon Lips in [facility]. She has had a trial of physical therapy and oral NSAIDs which did not work and made her feel worse. She did see Dr. Maurice Small who recommended back injections after her lumbar spine MRI showing spinal stenosis. She has not yet proceeded with back injections as she was worried about side effects.  Knee pain, fibro Neck pain  Wrist fx   PAIN:  Are you having pain? Yes: NPRS scale: 5/10 Pain location: neck and back    Pain description: tight, sore, achy  Aggravating factors: pulling, lifting  Relieving factors: sitting, leaning forward, recliner   PRECAUTIONS: Other: Osteoporosis   RED FLAGS: None   WEIGHT BEARING RESTRICTIONS: No  FALLS:  Has patient fallen in last 6 months? 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  DIAGNOSTIC FINDINGS:  Spine osteopenia T score -2.5 -2.7 in hips. Spine -1.7   PATIENT SURVEYS:  NT on eval  FOTO taken on 2nd visit (12/17/2022): Current: 51%, 53 % on renewal  Expected: 61%  SCREENING FOR RED FLAGS: Bowel or bladder incontinence: No Spinal tumors: No Cauda equina syndrome: No Compression fracture: No Abdominal aneurysm: No  COGNITION: Overall cognitive status: Within functional limits for tasks assessed     SENSATION: WFL  MUSCLE LENGTH: Hamstrings: WNL  Thomas test: WNL Tight quads L > R    POSTURE: rounded shoulders and forward head  PALPATION: Pain in low lumbar region bilateral L4-L5 R> L .  Pain in gluteals , soreness.   LUMBAR ROM:   AROM eval 01/08/24  Flexion NT NT   Extension 50% limited pain 50% with pain   Right lateral flexion WNL pain  Min pain 25% limited   Left lateral flexion WNL  Min pain 25%   Right rotation 25%  25% limited pain   Left rotation 25% pain on Rt  Pain, 50%    (Blank rows = not tested)  LOWER EXTREMITY ROM:   WNL   Passive  Right eval Left eval  Hip flexion    Hip extension    Hip abduction    Hip adduction    Hip internal rotation    Hip external rotation    Knee flexion    Knee extension    Ankle dorsiflexion    Ankle plantarflexion     Ankle inversion    Ankle eversion     (Blank rows = not tested)  LOWER EXTREMITY MMT:    MMT Right eval Left eval  Hip flexion 5 5  Hip extension    Hip abduction 4- 5  Hip adduction    Hip internal rotation    Hip external rotation    Knee flexion 4+ 5  Knee extension  4+ 5  Ankle dorsiflexion    Ankle plantarflexion    Ankle inversion    Ankle eversion     (Blank rows = not tested)  LUMBAR SPECIAL TESTS:  Straight leg raise test: Negative and NT  FUNCTIONAL TESTS:  5 times sit to stand: 18 sec  Hinge vs squat    01/08/24: 18.5 sec  GAIT: Distance walked: 150 Assistive device utilized: None Level of assistance: Modified independence Comments: no deviations    [facility] Adult PT Treatment:                                                DATE: 01/28/2024  Therapeutic Exercise: Nu step 5' Stool rollouts 3'   Therapeutic Activity: Seated infinity signs with medicine balls 3x30s Standing row BTB 2x12, 2s hold Prone y/t, BW 2x10, 2s hold STS x10 Self care POC discussion     [facility] Adult PT Treatment:                                                DATE: 01/08/24 Therapeutic Exercise: NuStep 5 min  5 x STS Hip abduction 1 x 20 each side 2 lbs  Heel raises 2 bs cuff wgt  Shoulder extension green band x 15  Row x 15  Hip abduction 2 lbs sidelying  x 2 x 10  SLR 1 x 10 , 2 lbs cuff  Therapeutic Activity  Goal check, cervical rotation, trunk ROM, lifting and posture     Self care: POC, Osteoporosis precautions, flexion vs extension, anatomy, MRI review, posture, importance of core and muscle strength    PATIENT EDUCATION:  Education details: see above  Person educated: Patient Education method: Programmer, multimedia, Demonstration, Verbal cues, and Handouts Education comprehension: verbalized understanding and needs further education  HOME EXERCISE PROGRAM: Access Code: [email or web address] URL: [email or web address] Date: 01/08/2024 Prepared by: Karie Mainland  Exercises - Beginner Bridge  - 1 x daily - 7 x weekly - 2 sets - 10 reps - 5 hold - Sidelying Hip Abduction  - 1 x daily - 7 x weekly - 2 sets - 10 reps - 5 hold - Sit to Stand Without Arm Support  - 1 x daily - 7 x weekly - 2 sets - 10 reps - 5 hold - Wall Quarter Squat  - 1 x daily - 7 x weekly - 2 sets - 10 reps - 0-10 hold - Gastroc Stretch on Wall  - 1 x daily - 7 x weekly - 1 sets - 3 reps - 30 hold - Standing Marching  - 1 x daily - 7 x weekly - 2 sets - 10 reps - Standing Hip Abduction with Counter Support  - 1 x daily - 7 x weekly - 2 sets - 15 reps - 2 hold - Shoulder extension with resistance - Neutral  - 1 x daily - 7 x weekly - 2 sets - 10 reps - 5 hold - Standing Shoulder Row with Anchored Resistance  - 1 x daily - 7 x weekly - 2 sets - 10 reps - 5 hold  ASSESSMENT:  CLINICAL IMPRESSION: Pt attended physical therapy session for continuation of treatment regarding low back and neck pain. Today's treatment focused on improvement  of  posterior chain strength, stability, and core activation pattern . Pt showed  good  tolerance to treatment and demonstrated improvement with posterior chain strength and activity tolerance. Some difficulties continued with ADLs such as shopping due to neck pain. Pt required moderate verbal/tactile cuing as well as no assistance for safe and appropriate performance of today's activities. Re eval next session. Double check SI      Patient was renewed for more visits to further improve her posture and mobility.  She not made improvements in her trunk motion, pain today.  She expressed desire to continue therapy, has only completed 5 visits.  She needs encouragement to be more active in general but she is limited due to back knee and neck pain .  Added 2 pound cuff weights to her lower extremity for exercising and she tolerated it well.  She will continue to benefit from skilled physical therapy in order to educate on activity, building bone density and   importance of functional strength.   FOTO outcome measure was taken and recorded in obj.    Eval impression 11/12/2023: Patient is a 68 y.o. female  who was seen today for physical therapy evaluation and treatment for back pain (neck, lower) due to spinal stenosis in the presence of osteoporosis. She will focus on core and strengthening with hopes of transitioning to community exercises   OBJECTIVE IMPAIRMENTS: decreased activity tolerance, decreased mobility, difficulty walking, decreased ROM, decreased strength, increased fascial restrictions, impaired flexibility, improper body mechanics, and pain.   ACTIVITY LIMITATIONS: carrying, lifting, bending, standing, squatting, sleeping, stairs, and locomotion level  PARTICIPATION LIMITATIONS: meal prep, cleaning, laundry, interpersonal relationship, shopping, and community activity  PERSONAL FACTORS: Past/current experiences, Time since onset of injury/illness/exacerbation, and 1-2 comorbidities: fibromyalgia, knee pain   are also affecting patient's functional outcome.   REHAB POTENTIAL: Excellent  CLINICAL DECISION MAKING: Stable/uncomplicated  EVALUATION COMPLEXITY: Low   GOALS: Goals reviewed with patient? Yes  SHORT TERM GOALS: Target date: 12/10/2023    Pt will be I HEP for strength  Baseline: Goal status: met   2.  FOTO will be completed and goal set  Baseline:  Goal status: met   3.  Pt will demo proper and safe lifting with hip hinge techniques.  Baseline:  Goal status:met    LONG TERM GOALS: Target date: 01/07/2024    Pt will be I with HEP upon discharge  Baseline:  Goal status: ongoing  2.  Pt will be able to tolerate standing for 30 min at a time without increasing pain Baseline: 01/08/24: stand to cook with min pain , standing still is hard  Goal status: ongoing  3.  Pt will be able to load dishwasher, pull out laundry without increased pain in back  Baseline: 01/08/24: with careful thought  Goal status:  ongoing   4.  Pt will be able to report no increased neck pain with driving, turning head  Baseline: 01/08/24 painful .  Lt 60 deg Rt 50 deg  Goal status: ongoing   5.  Pt will begin walking program for improved bone density 3 x per week x 30 min  Baseline: 01/08/24: has not done because of cold and didn't find out about [place] [employer]  Goal status: ongoing   6.  Pt. Will achieve a FOTO score of 61% as to demonstrate improvement in self-perceived functional ability with daily activities. (NRP 12/18/2023)   Baseline: 51%, 54%  Goal status: ongoing   PLAN:  PT FREQUENCY: 1x/week  PT DURATION:  6 weeks  PLANNED INTERVENTIONS: 97164- PT Re-evaluation, 97110-Therapeutic exercises, 97530- Therapeutic activity, [id number]- Neuromuscular re-education, 97535- Self Care, 16109- Manual therapy, Patient/Family education, and Spinal mobilization.  PLAN FOR NEXT SESSION: Gentle strength and Posture . Check new HEP (bands)   Sheliah Plane, PT, DPT 01/28/2024, 12:15 PM

## 2024-02-02 ENCOUNTER — Encounter: Payer: Self-pay | Admitting: Family Medicine

## 2024-02-02 ENCOUNTER — Ambulatory Visit: Payer: HMO | Admitting: Physician Assistant

## 2024-02-02 ENCOUNTER — Ambulatory Visit: Payer: Self-pay | Admitting: Family Medicine

## 2024-02-02 ENCOUNTER — Ambulatory Visit (INDEPENDENT_AMBULATORY_CARE_PROVIDER_SITE_OTHER): Payer: HMO | Admitting: Family Medicine

## 2024-02-02 VITALS — BP 136/80 | HR 65 | Temp 97.6°F | Ht 68.0 in | Wt 169.8 lb

## 2024-02-02 DIAGNOSIS — H6992 Unspecified Eustachian tube disorder, left ear: Secondary | ICD-10-CM

## 2024-02-02 DIAGNOSIS — J069 Acute upper respiratory infection, unspecified: Secondary | ICD-10-CM | POA: Diagnosis not present

## 2024-02-02 MED ORDER — PSEUDOEPHEDRINE-GUAIFENESIN ER 60-600 MG PO TB12: 1.0000 | ORAL_TABLET | Freq: Two times a day (BID) | ORAL | 0 refills | Status: DC

## 2024-02-02 NOTE — Progress Notes (Unsigned)
 Established Patient Office Visit   Subjective:  Patient ID: [name] [name], female    DOB: Oct 21, 1956  Age: 68 y.o. MRN: [id number]  Chief Complaint  Patient presents with   Facial Pain    Sinus pressure, left ear pain and post nasal drainage x 3-4 days.     HPI Encounter Diagnoses  Name Primary?   Viral upper respiratory tract infection Yes   Dysfunction of left eustachian tube    3-day history of malaise with sinus pressure, nasal congestion, postnasal drip and a mild cough.  Denies fevers chills, difficulty breathing, wheezing or asthma history.  She is having left ear discomfort.  This usually happens with colds and whatnot.  She has ongoing arthralgias and is seeing a physical therapist. {History (Optional):23778}  Review of Systems  Constitutional: Negative.  Negative for chills and fever.  HENT:  Positive for congestion, hearing loss and sinus pain.   Eyes:  Negative for blurred vision, discharge and redness.  Respiratory:  Positive for cough. Negative for sputum production, shortness of breath and wheezing.   Cardiovascular: Negative.   Gastrointestinal:  Negative for abdominal pain.  Genitourinary: Negative.   Musculoskeletal: Negative.  Negative for myalgias.  Skin:  Negative for rash.  Neurological:  Negative for tingling, loss of consciousness and weakness.  Endo/Heme/Allergies:  Negative for polydipsia.     Current Outpatient Medications:    Calcium Carbonate (CALCIUM 600 PO), Take by mouth every other day., Disp: , Rfl:    Cholecalciferol (VITAMIN D3) 2000 UNITS TABS, Take 1 capsule by mouth daily., Disp: , Rfl:    ezetimibe (ZETIA) 10 MG tablet, TAKE 1 TABLET BY MOUTH EVERY DAY, Disp: 90 tablet, Rfl: 3   fluticasone (FLONASE) 50 MCG/ACT nasal spray, Place 1 spray into both nostrils daily., Disp: , Rfl:    Magnesium 250 MG TABS, Take 1 tablet by mouth daily., Disp: , Rfl:    pseudoephedrine-guaifenesin (MUCINEX D) 60-600 MG 12 hr tablet, Take 1 tablet by mouth  every 12 (twelve) hours., Disp: 20 tablet, Rfl: 0   alendronate (FOSAMAX) 70 MG tablet, Take 1 tablet (70 mg total) by mouth every 7 (seven) days. (Patient not taking: Reported on 02/02/2024), Disp: 4 tablet, Rfl: 11   mometasone (ELOCON) 0.1 % cream, Apply to affected area once a day. (Patient not taking: Reported on 02/02/2024), Disp: 15 g, Rfl: 1   vitamin E 400 UNIT capsule, Take 400 Units by mouth daily.  (Patient not taking: Reported on 12/04/2023), Disp: , Rfl:    Objective:     BP 136/80   Pulse 65   Temp 97.6 F (36.4 C)   Ht 5\' 8"  (1.727 m)   Wt 169 lb 12.8 oz (77 kg)   SpO2 97%   BMI 25.82 kg/m  {Vitals History (Optional):23777}  Physical Exam Constitutional:      General: She is not in acute distress.    Appearance: Normal appearance. She is not ill-appearing, toxic-appearing or diaphoretic.  HENT:     Head: Normocephalic and atraumatic.     Right Ear: External ear normal. No middle ear effusion. Tympanic membrane is retracted. Tympanic membrane is not erythematous.     Left Ear: External ear normal.  No middle ear effusion. Tympanic membrane is retracted. Tympanic membrane is not erythematous.     Mouth/Throat:     Mouth: Mucous membranes are moist.     Pharynx: Oropharynx is clear. No oropharyngeal exudate or posterior oropharyngeal erythema.  Eyes:     General: No  scleral icterus.       Right eye: No discharge.        Left eye: No discharge.     Extraocular Movements: Extraocular movements intact.     Conjunctiva/sclera: Conjunctivae normal.     Pupils: Pupils are equal, round, and reactive to light.  Cardiovascular:     Rate and Rhythm: Normal rate and regular rhythm.  Pulmonary:     Effort: Pulmonary effort is normal. No respiratory distress.     Breath sounds: Normal breath sounds. No wheezing or rales.  Abdominal:     General: Bowel sounds are normal.     Tenderness: There is no abdominal tenderness. There is no guarding.  Musculoskeletal:     Cervical  back: No rigidity or tenderness.  Skin:    General: Skin is warm and dry.  Neurological:     Mental Status: She is alert and oriented to person, place, and time.  Psychiatric:        Mood and Affect: Mood normal.        Behavior: Behavior normal.      No results found for any visits on 02/02/24.  {Labs (Optional):23779}  The 10-year ASCVD risk score (Arnett DK, et al., 2019) is: 14.3%    Assessment & Plan:   Viral upper respiratory tract infection -     Pseudoephedrine-guaiFENesin ER; Take 1 tablet by mouth every 12 (twelve) hours.  Dispense: 20 tablet; Refill: 0  Dysfunction of left eustachian tube -     Pseudoephedrine-guaiFENesin ER; Take 1 tablet by mouth every 12 (twelve) hours.  Dispense: 20 tablet; Refill: 0    Return Continue Flonase. Call Thursday if not better..  Continue Flonase.  Start Mucinex D.  Information was given on viral URI and eustachian tube dysfunction.  Call Thursday as needed no improvement  Mliss Sax, MD

## 2024-02-02 NOTE — Telephone Encounter (Signed)
 Copied from CRM [id number]. Topic: Clinical - Red Word Triage >> Feb 02, 2024  9:23 AM Orinda Kenner C wrote: Red Word that prompted transfer to Nurse Triage: Patient wants to speak with nurse, tired, sinus pain, head congestion, headaches, face hurts between the eyes, ears pain, coughing phlegm thick yellow in color, and painful to cough. Using Flonase. Patient denies a fever, dizziness, shortness of breath. Please advise [phone].   Chief Complaint: Sinus pain Symptoms: Sinus pain, productive cough, congestion Frequency: Ongoing for 3-4 days Pertinent Negatives: Patient denies fever, SOB Disposition: [] ED /[] Urgent Care (no appt availability in office) / [x] Appointment(In office/virtual)/ []  [facility]/ [] Home Care/ [] Refused Recommended Disposition /[]  Mobile Bus/ []  Follow-up with PCP Additional Notes: Patient stated that she has been having sinus headache, ear pain, cough, and congestion for 3-4 days. No appointment available at PCP office today. Same day appt scheduled at different office.   Reason for Disposition  Earache  Answer Assessment - Initial Assessment Questions 1. LOCATION: "Where does it hurt?"      Facial, mostly between eyes  2. ONSET: "When did the sinus pain start?"  (e.g., hours, days)      3-4 days ago  3. SEVERITY: "How bad is the pain?"   (Scale 1-10; mild, moderate or severe)   - MILD (1-3): doesn't interfere with normal activities    - MODERATE (4-7): interferes with normal activities (e.g., work or school) or awakens from sleep   - SEVERE (8-10): excruciating pain and patient unable to do any normal activities        6 or 7  4. RECURRENT SYMPTOM: "Have you ever had sinus problems before?" If Yes, ask: "When was the last time?" and "What happened that time?"      Yes pt has sinus problems before  5. NASAL CONGESTION: "Is the nose blocked?" If Yes, ask: "Can you open it or must you breathe through your mouth?"     Having nasal congestion,  no SOB  6. NASAL DISCHARGE: "Do you have discharge from your nose?" If so ask, "What color?"     Yes, yellow discharge  7. FEVER: "Do you have a fever?" If Yes, ask: "What is it, how was it measured, and when did it start?"      No  8. OTHER SYMPTOMS: "Do you have any other symptoms?" (e.g., sore throat, cough, earache, difficulty breathing)     Productive Cough (yellow phlegm), L earache  Protocols used: Sinus Pain or Congestion-A-AH

## 2024-02-02 NOTE — Telephone Encounter (Signed)
 Patient scheduled today with another provider. Dm/cma

## 2024-02-03 ENCOUNTER — Ambulatory Visit: Payer: Self-pay | Admitting: Family Medicine

## 2024-02-03 ENCOUNTER — Telehealth: Payer: Self-pay

## 2024-02-03 NOTE — Telephone Encounter (Signed)
  Chief Complaint: worsening sinus congestion Symptoms: cough, sinus pressure, earache, sore throat Frequency: 5-6 days total, worsening last night Pertinent Negatives: Patient denies SOB, CP, NVD, fever Disposition: [] ED /[] Urgent Care (no appt availability in office) / [x] Appointment(In office/virtual)/ []  [facility]/ [] Home Care/ [x] Refused Recommended Disposition /[] [facility]/ []  Follow-up with PCP Additional Notes: Patient calls reporting worsening symptoms after visit yesterday. States she was advised to call back with worsening symptoms. States she noticed her phlegm turned dark green last night and she is requesting antibiotic. Per protocol, patient to be evaluated within 24 hours. She is requesting call back vs scheduling an appt due to being seen yesterday. Care advice reviewed, patient verbalized understandingand denies further questions at this time. Alerting PCP for review and follow up.    Copied from CRM [id number]. Topic: Clinical - Red Word Triage >> Feb 03, 2024  8:13 AM Irine Seal wrote: [facility] that prompted transfer to Nurse Triage: patient was seen in office yesterday for viral upper respiratory infection, she was advised to call back if she wasn't feeling better by Thursday. patient stated that she worsened overnight. her mucous changed from yellow to dark green. post nasal drip, cough has worsened, sinus pressure is still present but now she has a headache across her eyes. left ear is inflamed, patient denies fever and difficulty breathing. she is wanting to see if she can get an antibiotic called in or what she can do. she is taking OTC Mucinex DM Reason for Disposition  Earache  Answer Assessment - Initial Assessment Questions 1. LOCATION: "Where does it hurt?"      Forehead and around ears 2. ONSET: "When did the sinus pain start?"  (e.g., hours, days)      Worsened last night, states she has been sick approx 5-6 days total. 3. SEVERITY: "How bad  is the pain?"   (Scale 1-10; mild, moderate or severe)   - MILD (1-3): doesn't interfere with normal activities    - MODERATE (4-7): interferes with normal activities (e.g., work or school) or awakens from sleep   - SEVERE (8-10): excruciating pain and patient unable to do any normal activities        8/10 4. RECURRENT SYMPTOM: "Have you ever had sinus problems before?" If Yes, ask: "When was the last time?" and "What happened that time?"      Yes, sinus infection, states she has bad allergies. 5. NASAL CONGESTION: "Is the nose blocked?" If Yes, ask: "Can you open it or must you breathe through your mouth?"     Congestion 6. NASAL DISCHARGE: "Do you have discharge from your nose?" If so ask, "What color?"     Dark green 7. FEVER: "Do you have a fever?" If Yes, ask: "What is it, how was it measured, and when did it start?"      Denies 8. OTHER SYMPTOMS: "Do you have any other symptoms?" (e.g., sore throat, cough, earache, difficulty breathing)     Earache, congestion, sinus pressure and drainage, sore throat from drainage.  Protocols used: Sinus Pain or Congestion-A-AH

## 2024-02-03 NOTE — Telephone Encounter (Addendum)
 Copied from CRM [id number]. Topic: Clinical - Medication Question >> Feb 03, 2024  3:01 PM [name] [name] wrote: Reason for CRM: Antibiotic >> Feb 03, 2024  3:03 PM [name] [name] wrote: Patient was calling in wanting an update on rather or not the provider sent over the prescription for an antibiotic. Patient is requesting a call back at [phone]  Called patient to inform her that RX was sent on 02/02/2024. Pt didn't answer  left VM.   Patient returned phone call; she is experiencing pain on left side of throat and ear, with coughing up greenish phlegm and headache. She is taking Mucinex  DM as prescribed but voiced she feels worse than yesterday. LOV was on 02/02/2024 URI. Pt is requesting antibiotics. Please advise.

## 2024-02-03 NOTE — Telephone Encounter (Signed)
 Called patient to inform that RX was sent on 02/02/2024. Pt didn't answer left VM.

## 2024-02-04 ENCOUNTER — Encounter: Payer: HMO | Admitting: Physical Therapy

## 2024-02-04 NOTE — Telephone Encounter (Signed)
 LVM with Dr. Evangeline Gula instructions and advises to call the office if any additional questions.

## 2024-02-05 ENCOUNTER — Encounter: Payer: HMO | Admitting: Physical Therapy

## 2024-02-11 ENCOUNTER — Encounter: Payer: Self-pay | Admitting: Physical Therapy

## 2024-02-11 ENCOUNTER — Ambulatory Visit: Payer: HMO | Admitting: Physical Therapy

## 2024-02-11 DIAGNOSIS — M5459 Other low back pain: Secondary | ICD-10-CM

## 2024-02-11 DIAGNOSIS — R29898 Other symptoms and signs involving the musculoskeletal system: Secondary | ICD-10-CM

## 2024-02-11 DIAGNOSIS — M542 Cervicalgia: Secondary | ICD-10-CM

## 2024-02-11 NOTE — Therapy (Signed)
 OUTPATIENT PHYSICAL THERAPY NOTE   Patient Name: [name] [name] MRN: [id number] DOB:April 03, 1956, 68 y.o., female Today's Date: 02/11/2024    PHYSICAL THERAPY DISCHARGE SUMMARY  Visits from Start of Care: 8  Current functional level related to goals / functional outcomes: See below.   Remaining deficits: Pain , weakness   Education / Equipment: HEP, posture, lifting and body mechanics   Patient agrees to discharge. Patient goals were partially met. Patient is being discharged due to maximized rehab potential.      END OF SESSION:  PT End of Session - 02/11/24 0938     Visit Number 8    Number of Visits 11    Date for PT Re-Evaluation 02/19/24    Authorization Type [employer]    PT Start Time 0935    PT Stop Time 1017    PT Time Calculation (min) 42 min    Activity Tolerance Patient tolerated treatment well    Behavior During Therapy WFL for tasks assessed/performed                  Past Medical History:  Diagnosis Date   Anxiety    Arthritis    HANDS,  KNEES   Bone tumor (benign)    RIGHT FEMUR   Borderline hypertension    Female pelvic pain    Fibromyalgia    Left knee injury    TENDONDINITIS/  CHONDRAMALIA   PONV (postoperative nausea and vomiting)    Past Surgical History:  Procedure Laterality Date   DILATION AND CURETTAGE OF UTERUS  1986  &  1988   RETAINED PLACENTA   jaw tumor Right 2020   right lower jaw tumor removed 2020 by Dr. Retta Mac   LAPAROSCOPIC ASSISTED VAGINAL HYSTERECTOMY  01-25-2003   LAPAROSCOPY N/A 04/18/2014   Procedure: LAPAROSCOPY DIAGNOSTIC;  Surgeon: Meriel Pica, MD;  Location: [facility];  Service: Gynecology;  Laterality: N/A;   NASAL SEPTUM SURGERY  AGE 69   OPEN REDUCTION INTERNAL FIXATION (ORIF) DISTAL RADIAL FRACTURE Left 09/18/2020   Procedure: OPEN TREATMENT OF LEFT DISTAL RADIUS FRACTURE;  Surgeon: Mack Hook, MD;  Location: [facility];  Service: Orthopedics;   Laterality: Left;  LENGTH OF SURGERY: 75 MIN  PRE OP BLOCK   SALPINGOOPHORECTOMY Bilateral 04/18/2014   Procedure: SALPINGO OOPHORECTOMY;  Surgeon: Meriel Pica, MD;  Location: [facility];  Service: Gynecology;  Laterality: Bilateral;   TONSILLECTOMY  AGE 58   TUBAL LIGATION     Patient Active Problem List   Diagnosis Date Noted   COVID-19 12/04/2023   Eczema 11/25/2023   Spinal stenosis at L4-L5 level 02/26/2023   Neuroforaminal stenosis of lumbar spine 02/26/2023   Hyperlipidemia 12/25/2021   Scoliosis deformity of spine 04/24/2021   Degeneration of spine 04/24/2021   Anxiety 04/24/2021   Osteoporosis 09/02/2019   Adjustment disorder with mixed anxiety and depressed mood 07/04/2015   Allergic rhinitis 07/04/2015   Essential hypertension 03/24/2015   Tobacco abuse 09/01/2012   Fibromyalgia    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  She is not really back to normal. This is her last day of PT .    PERTINENT HISTORY:  MD notes: Additionally she notes chronic low back pain with pain radiating down both legs. This problem has been well addressed by Dr. Madelon Lips in [facility]. She has had a trial of physical therapy and oral NSAIDs which did not work and made her  feel worse. She did see Dr. Maurice Small who recommended back injections after her lumbar spine MRI showing spinal stenosis. She has not yet proceeded with back injections as she was worried about side effects.  Knee pain, fibro Neck pain  Wrist fx   PAIN:  Are you having pain? Yes: NPRS scale: 7/10 Pain location: neck and back   Pain description: tight, sore, achy  Aggravating factors: pulling, lifting  Relieving factors: sitting, leaning forward, recliner   PRECAUTIONS: Other: Osteoporosis   RED FLAGS: None   WEIGHT BEARING RESTRICTIONS: No  FALLS:  Has patient fallen in last 6 months? 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  DIAGNOSTIC FINDINGS:  Spine osteopenia T score -2.5 -2.7 in hips. Spine -1.7   PATIENT SURVEYS:  NT on eval  FOTO taken on 2nd visit (12/17/2022): Current: 51%, 53 % on renewal  Expected: 61%  SCREENING FOR RED FLAGS: Bowel or bladder incontinence: No Spinal tumors: No Cauda equina syndrome: No Compression fracture: No Abdominal aneurysm: No  COGNITION: Overall cognitive status: Within functional limits for tasks assessed     SENSATION: WFL  MUSCLE LENGTH: Hamstrings: WNL  Thomas test: WNL Tight quads L > R    POSTURE: rounded shoulders and forward head  PALPATION: Pain in low lumbar region bilateral L4-L5 R> L .  Pain in gluteals , soreness.   LUMBAR ROM:   AROM eval 01/08/24  Flexion NT NT   Extension 50% limited pain 50% with pain   Right lateral flexion WNL pain  Min pain 25% limited   Left lateral flexion WNL  Min pain 25%   Right rotation 25%  25% limited pain   Left  rotation 25% pain on Rt  Pain, 50%    (Blank rows = not tested)  LOWER EXTREMITY ROM:   WNL   Passive  Right eval Left eval  Hip flexion    Hip extension    Hip abduction    Hip adduction    Hip internal rotation    Hip external rotation    Knee flexion    Knee extension    Ankle dorsiflexion    Ankle plantarflexion    Ankle inversion    Ankle eversion     (Blank rows = not tested)  LOWER EXTREMITY MMT:    MMT Right eval Left eval  Hip flexion 5 5  Hip extension    Hip abduction 4- 5  Hip adduction    Hip internal rotation    Hip external rotation    Knee flexion 4+ 5  Knee extension 4+ 5  Ankle dorsiflexion    Ankle plantarflexion    Ankle inversion  Ankle eversion     (Blank rows = not tested)  LUMBAR SPECIAL TESTS:  Straight leg raise test: Negative and NT  FUNCTIONAL TESTS:  5 times sit to stand: 18 sec  Hinge vs squat    01/08/24: 18.5 sec  GAIT: Distance walked: 150 Assistive device utilized: None Level of assistance: Modified independence Comments: no deviations     [facility] Adult PT Treatment:                                                DATE: 02/11/24  Therapeutic Activity: Supine knee to chest alternating Bridging with core activation, heavy cues  Core bracing  Reinforced throughout session  Hip abduction x  15 using wall  Core bracing on the wall  Sit to stand x 5, 19 sec  Extension/Row Green band x 2 x 15 reps  Self Care: Education about strengthening, POC, breathing to support back and core General fitness, walking and joining the [employer]  [facility] Adult PT Treatment:                                                DATE: 01/28/2024  Therapeutic Exercise: Nu step 5' Stool rollouts 3'   Therapeutic Activity: Seated infinity signs with medicine balls 3x30s Standing row BTB 2x12, 2s hold Prone y/t, BW 2x10, 2s hold STS x10 Self care POC discussion     [facility] Adult PT Treatment:                                                 DATE: 01/08/24 Therapeutic Exercise: NuStep 5 min  5 x STS Hip abduction 1 x 20 each side 2 lbs  Heel raises 2 bs cuff wgt  Shoulder extension green band x 15  Row x 15  Hip abduction 2 lbs sidelying  x 2 x 10  SLR 1 x 10 , 2 lbs cuff  Therapeutic Activity  Goal check, cervical rotation, trunk ROM, lifting and posture     Self care: POC, Osteoporosis precautions, flexion vs extension, anatomy, MRI review, posture, importance of core and muscle strength    PATIENT EDUCATION:  Education details: see above  Person educated: Patient Education method: Programmer, multimedia, Demonstration, Verbal cues, and Handouts Education comprehension: verbalized understanding and needs further education  HOME EXERCISE PROGRAM: Access Code: [email or web address] URL: [email or web address] Date: 02/11/2024 Prepared by: Karie Mainland  Exercises - Supine Transversus Abdominis Bracing - Hands on Stomach  - 1 x daily - 7 x weekly - 2 sets - 10 reps - 5 hold - Beginner Bridge  - 1 x daily - 7 x weekly - 2 sets - 10 reps - 5 hold - Sidelying Hip Abduction  - 1 x daily - 7 x weekly - 2 sets - 10 reps - 5 hold - Sit to Stand Without Arm Support  - 1 x daily - 7 x weekly - 2 sets - 10 reps - 5 hold - Wall Quarter Squat  - 1 x daily - 7 x weekly - 2 sets - 10 reps - 0-10 hold - Theme park manager  on Wall  - 1 x daily - 7 x weekly - 1 sets - 3 reps - 30 hold - Standing Marching  - 1 x daily - 7 x weekly - 2 sets - 10 reps - Standing Hip Abduction with Counter Support  - 1 x daily - 7 x weekly - 2 sets - 15 reps - 2 hold - Shoulder extension with resistance - Neutral  - 1 x daily - 7 x weekly - 2 sets - 10 reps - 5 hold - Standing Shoulder Row with Anchored Resistance  - 1 x daily - 7 x weekly - 2 sets - 10 reps - 5 hold   ASSESSMENT:  CLINICAL IMPRESSION: Patient was discharged today due to maximized therapeutic benefit .  She was educated throughout her episode of PT generalized strengthening and fitness.   She plans to join the local [employer] so she will be closer to home.  She understands the importance of building bone density and overall muscular strength to support her spine. Goals are partially met     Eval impression 11/12/2023: Patient is a 68 y.o. female  who was seen today for physical therapy evaluation and treatment for back pain (neck, lower) due to spinal stenosis in the presence of osteoporosis. She will focus on core and strengthening with hopes of transitioning to community exercises   OBJECTIVE IMPAIRMENTS: decreased activity tolerance, decreased mobility, difficulty walking, decreased ROM, decreased strength, increased fascial restrictions, impaired flexibility, improper body mechanics, and pain.   ACTIVITY LIMITATIONS: carrying, lifting, bending, standing, squatting, sleeping, stairs, and locomotion level  PARTICIPATION LIMITATIONS: meal prep, cleaning, laundry, interpersonal relationship, shopping, and community activity  PERSONAL FACTORS: Past/current experiences, Time since onset of injury/illness/exacerbation, and 1-2 comorbidities: fibromyalgia, knee pain   are also affecting patient's functional outcome.   REHAB POTENTIAL: Excellent  CLINICAL DECISION MAKING: Stable/uncomplicated  EVALUATION COMPLEXITY: Low   GOALS: Goals reviewed with patient? Yes  SHORT TERM GOALS: Target date: 12/10/2023    Pt will be I HEP for strength  Baseline: Goal status: met   2.  FOTO will be completed and goal set  Baseline:  Goal status: met   3.  Pt will demo proper and safe lifting with hip hinge techniques.  Baseline:  Goal status:met    LONG TERM GOALS: Target date: 01/07/2024    Pt will be I with HEP upon discharge  Baseline:  Goal status: MET   2.  Pt will be able to tolerate standing for 30 min at a time without increasing pain Baseline: 01/08/24: stand to cook with min pain , standing still is hard  Goal status: Partially met   3.  Pt will be able to load  dishwasher, pull out laundry without increased pain in back  Baseline: 01/08/24: with careful thought  Goal status: Partially met , a little better, pays more attention   4.  Pt will be able to report no increased neck pain with driving, turning head  Baseline: 01/08/24 painful .  Lt 60 deg Rt 50 deg  Goal status: not met   5.  Pt will begin walking program for improved bone density 3 x per week x 30 min  Baseline: 01/08/24: has not done because of cold and didn't find out about [employer]  Not yet doing this.  Goal status: not met consistently   6.  Pt. Will achieve a FOTO score of 61% as to demonstrate improvement in self-perceived functional ability with daily activities. (NRP 12/18/2023)   Baseline:  51%, 54%  Goal status: partially met  PLAN:  PT FREQUENCY: 1x/week  PT DURATION: 6 weeks  PLANNED INTERVENTIONS: 97164- PT Re-evaluation, 97110-Therapeutic exercises, 97530- Therapeutic activity, [id number]- Neuromuscular re-education, 97535- Self Care, [address]- Manual therapy, Patient/Family education, and Spinal mobilization.  PLAN FOR NEXT SESSION: Gentle strength and Posture . Check new HEP (bands)   Karie Mainland, PT 02/11/24 11:58 AM Phone: [phone] Fax: [phone]

## 2024-02-25 ENCOUNTER — Encounter: Payer: Self-pay | Admitting: Family Medicine

## 2024-02-25 ENCOUNTER — Ambulatory Visit (INDEPENDENT_AMBULATORY_CARE_PROVIDER_SITE_OTHER): Admitting: Family Medicine

## 2024-02-25 VITALS — BP 120/76 | HR 76 | Temp 98.0°F | Ht 68.0 in | Wt 170.0 lb

## 2024-02-25 DIAGNOSIS — Z1211 Encounter for screening for malignant neoplasm of colon: Secondary | ICD-10-CM | POA: Diagnosis not present

## 2024-02-25 DIAGNOSIS — M5412 Radiculopathy, cervical region: Secondary | ICD-10-CM

## 2024-02-25 DIAGNOSIS — R221 Localized swelling, mass and lump, neck: Secondary | ICD-10-CM | POA: Diagnosis not present

## 2024-02-25 DIAGNOSIS — Z720 Tobacco use: Secondary | ICD-10-CM | POA: Diagnosis not present

## 2024-02-25 DIAGNOSIS — I10 Essential (primary) hypertension: Secondary | ICD-10-CM | POA: Diagnosis not present

## 2024-02-25 HISTORY — DX: Localized swelling, mass and lump, neck: R22.1

## 2024-02-25 HISTORY — DX: Radiculopathy, cervical region: M54.12

## 2024-02-25 NOTE — Assessment & Plan Note (Signed)
 Appears to be benign and has not been growing in size. We will monitor this for now.

## 2024-02-25 NOTE — Assessment & Plan Note (Signed)
 Symptoms are suggestive of a C6 radiculopathy. She would be at risk for cervical disc disease in light of her more severe lumbar issues. I iwll refer her to neurosurgery for evaluation.

## 2024-02-25 NOTE — Assessment & Plan Note (Signed)
 Continue to advise that she stop smoking.

## 2024-02-25 NOTE — Progress Notes (Signed)
 [facility] [address] [place] [address] Dept: [phone] Dept Fax: [phone]  Chronic Care Office Visit  Subjective:    Patient ID: [name] [name], female    DOB: June 06, 1956, 67 y.o..   MRN: [id number]  Chief Complaint  Patient presents with   Neck Pain    F/u neck pain on RT side neck x 3-4 months.     History of Present Illness:  Patient is in today for reassessment of chronic medical issues.  [name] [name] has a history of hypertension. She is currently off of medication. She feels her stress level ahs to do with her BP being up at times.   [name] [name] has a history of smoking. She is able to quit smoking for 4-5 days by using a nicotine patch, but then has relapses related to stress.    [name] [name] has a history of hyperlipidemia. She is currently managed on Zetia 10 mg daily.   [name] [name] has a history of severe spinal stenosis. More recently, she has noted issues with bilateral numbness of her thumbs and the lateral aspect of the forearm. She is not specifically having neck pain. She does get pains in the upper back on either side of the upper thoracic spine.  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 Retta Mac   LAPAROSCOPIC ASSISTED VAGINAL HYSTERECTOMY  01-25-2003   LAPAROSCOPY N/A 04/18/2014   Procedure: LAPAROSCOPY DIAGNOSTIC;  Surgeon: Meriel Pica, MD;  Location: [facility];  Service: Gynecology;  Laterality: N/A;   NASAL SEPTUM SURGERY  AGE 35   OPEN REDUCTION INTERNAL FIXATION (ORIF) DISTAL RADIAL FRACTURE Left 09/18/2020   Procedure: OPEN TREATMENT OF LEFT DISTAL RADIUS FRACTURE;  Surgeon: Mack Hook, MD;  Location: [facility];  Service: Orthopedics;  Laterality: Left;  LENGTH OF SURGERY: 75 MIN  PRE OP BLOCK   SALPINGOOPHORECTOMY Bilateral 04/18/2014   Procedure: SALPINGO OOPHORECTOMY;  Surgeon: Meriel Pica, MD;  Location: [facility];  Service: Gynecology;  Laterality: Bilateral;   TONSILLECTOMY  AGE 28   TUBAL LIGATION     Family History  Problem Relation Age of Onset   Diabetes Mother    Heart disease Mother    Cancer Father        Lung   Diabetes Sister    Diabetes Sister    Colon cancer Neg Hx    Stomach cancer Neg Hx    Outpatient Medications Prior to Visit  Medication Sig Dispense Refill   Calcium Carbonate (CALCIUM 600 PO) Take by mouth every other day.     Cholecalciferol (VITAMIN D3) 2000 UNITS TABS Take 1 capsule by mouth daily.     ezetimibe (ZETIA)  10 MG tablet TAKE 1 TABLET BY MOUTH EVERY DAY 90 tablet 3   fluticasone (FLONASE) 50 MCG/ACT nasal spray Place 1 spray into both nostrils daily.     Magnesium 250 MG TABS Take 1 tablet by mouth daily.     pseudoephedrine-guaifenesin (MUCINEX D) 60-600 MG 12 hr tablet Take 1 tablet by mouth every 12 (twelve) hours. 20 tablet 0   vitamin E 400 UNIT capsule Take 400 Units by mouth daily.  (Patient not taking: Reported on 12/04/2023)     alendronate (FOSAMAX) 70 MG tablet Take 1 tablet (70 mg total) by mouth every 7 (seven) days. (Patient not taking: Reported on 02/02/2024) 4 tablet 11   mometasone (ELOCON) 0.1 % cream Apply to  affected area once a day. (Patient not taking: Reported on 02/02/2024) 15 g 1   No facility-administered medications prior to visit.   Allergies  Allergen Reactions   Hydrocodone Rash   Demerol [Meperidine] Nausea And Vomiting    severe   Molds & Smuts    Sulfa Antibiotics Swelling and Rash   Objective:   Today's Vitals   02/25/24 1540  BP: 120/76  Pulse: 76  Temp: 98 F (36.7 C)  TempSrc: Temporal  SpO2: 98%  Weight: 170 lb (77.1 kg)  Height: 5\' 8"  (1.727 m)   Body mass index is 25.85 kg/m.   General: Well developed, well nourished. No acute distress. Neck: 1-2 mm firm subcutaneous nodule in the right neck anterior to the SCM. Back: Straight. There are tender spots on either side of the upper thoracic spine, esp. over the    trapezius. Neuro: Subjective decreased sensation in the thumbs and lateral forearm in a C6 distribution   bilaterally. Psych: Alert and oriented. Normal mood and affect.  Health Maintenance Due  Topic Date Due   Zoster Vaccines- Shingrix (1 of 2) Never done   Colonoscopy  10/14/2022   Medicare Annual Wellness (AWV)  02/26/2024     Assessment & Plan:   Problem List Items Addressed This Visit       Cardiovascular and Mediastinum   Essential hypertension - Primary   BP is in good control. We will continue to monitor her blood pressure off of meds.        Nervous and Auditory   C6 radiculopathy   Symptoms are suggestive of a C6 radiculopathy. She would be at risk for cervical disc disease in light of her more severe lumbar issues. I iwll refer her to neurosurgery for evaluation.      Relevant Orders   Ambulatory referral to Neurosurgery     Musculoskeletal and Integument   Subcutaneous nodule of neck   Appears to be benign and has not been growing in size. We will monitor this for now.        Other   Tobacco abuse   Continue to advise that she stop smoking.      Other Visit Diagnoses       Screening for colon cancer        Relevant Orders   Ambulatory referral to Gastroenterology       Return in about 4 months (around 06/26/2024) for Reassessment.   Loyola Mast, MD

## 2024-02-25 NOTE — Assessment & Plan Note (Signed)
 BP is in good control. We will continue to monitor her blood pressure off of meds.

## 2024-03-15 ENCOUNTER — Ambulatory Visit: Payer: HMO

## 2024-03-22 ENCOUNTER — Ambulatory Visit (INDEPENDENT_AMBULATORY_CARE_PROVIDER_SITE_OTHER)

## 2024-03-22 VITALS — BP 136/64 | HR 70 | Temp 99.0°F | Ht 69.0 in | Wt 170.0 lb

## 2024-03-22 DIAGNOSIS — Z0000 Encounter for general adult medical examination without abnormal findings: Secondary | ICD-10-CM | POA: Diagnosis not present

## 2024-03-22 NOTE — Patient Instructions (Signed)
 [name] [name] , Thank you for taking time to come for your Medicare Wellness Visit. I appreciate your ongoing commitment to your health goals. Please review the following plan we discussed and let me know if I can assist you in the future.   Referrals/Orders/Follow-Ups/Clinician Recommendations: none  This is a list of the screening recommended for you and due dates:  Health Maintenance  Topic Date Due   Zoster (Shingles) Vaccine (1 of 2) Never done   Colon Cancer Screening  10/14/2022   COVID-19 Vaccine (3 - 2024-25 season) 08/17/2023   Pneumonia Vaccine (1 of 2 - PCV) 07/07/2024*   Flu Shot  07/16/2024   Mammogram  08/28/2024   Medicare Annual Wellness Visit  03/22/2025   DTaP/Tdap/Td vaccine (2 - Td or Tdap) 11/20/2027   DEXA scan (bone density measurement)  Completed   Hepatitis C Screening  Completed   HPV Vaccine  Aged Out  *Topic was postponed. The date shown is not the original due date.    Advanced directives: (Declined) Advance directive discussed with you today. Even though you declined this today, please call our office should you change your mind, and we can give you the proper paperwork for you to fill out.  Next Medicare Annual Wellness Visit scheduled for next year: Yes  insert Preventive Care attachment Insert FALL PREVENTION attachment if needed

## 2024-03-22 NOTE — Progress Notes (Addendum)
 Subjective:   [name] [name] is a 68 y.o. who presents for a Medicare Wellness preventive visit.  Visit Complete: In person    Persons Participating in Visit: Patient.  AWV Questionnaire: No: Patient Medicare AWV questionnaire was not completed prior to this visit.  Cardiac Risk Factors include: advanced age (>11men, >62 women)     Objective:    Today's Vitals   03/22/24 1413 03/22/24 1414  BP: 136/64   Pulse: 70   Temp: 99 F (37.2 C)   TempSrc: Oral   SpO2: 96%   Weight: 170 lb (77.1 kg)   Height: 5\' 9"  (1.753 m)   PainSc:  4    Body mass index is 25.1 kg/m.     03/22/2024    2:34 PM 11/12/2023   11:06 AM 09/18/2020    7:01 AM 09/14/2020    3:44 PM 09/11/2020    8:38 PM 04/18/2014    6:27 AM  Advanced Directives  Does Patient Have a Medical Advance Directive? No No No No No Patient does not have advance directive;Patient would not like information  Would patient like information on creating a medical advance directive?  No - Patient declined Yes (MAU/Ambulatory/Procedural Areas - Information given) No - Patient declined      Current Medications (verified) Outpatient Encounter Medications as of 03/22/2024  Medication Sig   Calcium Carbonate (CALCIUM 600 PO) Take by mouth every other day.   Cholecalciferol (VITAMIN D3) 2000 UNITS TABS Take 1 capsule by mouth daily.   ezetimibe (ZETIA) 10 MG tablet TAKE 1 TABLET BY MOUTH EVERY DAY   fluticasone (FLONASE) 50 MCG/ACT nasal spray Place 1 spray into both nostrils daily.   guaiFENesin-codeine 100-10 MG/5ML syrup Take 5 mLs by mouth 3 (three) times daily as needed.   Magnesium 250 MG TABS Take 1 tablet by mouth daily.   pseudoephedrine-guaifenesin (MUCINEX D) 60-600 MG 12 hr tablet Take 1 tablet by mouth every 12 (twelve) hours.   vitamin E 400 UNIT capsule Take 400 Units by mouth daily.  (Patient not taking: Reported on 12/04/2023)   No facility-administered encounter medications on file as of 03/22/2024.    Allergies  (verified) Hydrocodone, Demerol [meperidine], Molds & smuts, and Sulfa antibiotics   History: Past Medical History:  Diagnosis Date   Anxiety    Arthritis    HANDS,  KNEES   Bone tumor (benign)    RIGHT FEMUR   Borderline hypertension    Female pelvic pain    Fibromyalgia    Left knee injury    TENDONDINITIS/  CHONDRAMALIA   PONV (postoperative nausea and vomiting)    Past Surgical History:  Procedure Laterality Date   DILATION AND CURETTAGE OF UTERUS  1986  &  1988   RETAINED PLACENTA   jaw tumor Right 2020   right lower jaw tumor removed 2020 by Dr. Retta Mac   LAPAROSCOPIC ASSISTED VAGINAL HYSTERECTOMY  01-25-2003   LAPAROSCOPY N/A 04/18/2014   Procedure: LAPAROSCOPY DIAGNOSTIC;  Surgeon: Meriel Pica, MD;  Location: [facility];  Service: Gynecology;  Laterality: N/A;   NASAL SEPTUM SURGERY  AGE 89   OPEN REDUCTION INTERNAL FIXATION (ORIF) DISTAL RADIAL FRACTURE Left 09/18/2020   Procedure: OPEN TREATMENT OF LEFT DISTAL RADIUS FRACTURE;  Surgeon: Mack Hook, MD;  Location: [facility];  Service: Orthopedics;  Laterality: Left;  LENGTH OF SURGERY: 75 MIN  PRE OP BLOCK   SALPINGOOPHORECTOMY Bilateral 04/18/2014   Procedure: SALPINGO OOPHORECTOMY;  Surgeon: Meriel Pica, MD;  Location: [facility];  Service: Gynecology;  Laterality: Bilateral;   TONSILLECTOMY  AGE 4   TUBAL LIGATION     Family History  Problem Relation Age of Onset   Diabetes Mother    Heart disease Mother    Cancer Father        Lung   Diabetes Sister    Diabetes Sister    Colon cancer Neg Hx    Stomach cancer Neg Hx    Social History   Socioeconomic History   Marital status: Married    Spouse name: Not on file   Number of children: 2   Years of education: Not on file   Highest education level: High school graduate  Occupational History   Occupation: Retired  Tobacco Use   Smoking status: Every Day    Current packs/day: 0.25    Average  packs/day: 0.3 packs/day for 46.3 years (11.6 ttl pk-yrs)    Types: Cigarettes    Start date: 1979   Smokeless tobacco: Never   Tobacco comments:    Currently smokes about 1 cigarette a day.  Vaping Use   Vaping status: Never Used  Substance and Sexual Activity   Alcohol use: Not Currently    Comment: rare   Drug use: Yes    Types: Codeine   Sexual activity: Yes  Other Topics Concern   Not on file  Social History Narrative   Not on file   Social Drivers of Health   Financial Resource Strain: Low Risk  (03/22/2024)   Overall Financial Resource Strain (CARDIA)    Difficulty of Paying Living Expenses: Not hard at all  Food Insecurity: No Food Insecurity (03/22/2024)   Hunger Vital Sign    Worried About Running Out of Food in the Last Year: Never true    Ran Out of Food in the Last Year: Never true  Transportation Needs: No Transportation Needs (03/22/2024)   PRAPARE - Administrator, Civil Service (Medical): No    Lack of Transportation (Non-Medical): No  Physical Activity: Sufficiently Active (03/22/2024)   Exercise Vital Sign    Days of Exercise per Week: 7 days    Minutes of Exercise per Session: 30 min  Stress: No Stress Concern Present (03/22/2024)   [employer] of Occupational Health - Occupational Stress Questionnaire    Feeling of Stress : Not at all  Social Connections: Moderately Integrated (03/22/2024)   Social Connection and Isolation Panel [NHANES]    Frequency of Communication with Friends and Family: More than three times a week    Frequency of Social Gatherings with Friends and Family: More than three times a week    Attends Religious Services: More than 4 times per year    Active Member of [employer] or Organizations: No    Attends Banker Meetings: Never    Marital Status: Married    Tobacco Counseling Ready to quit: Not Answered Counseling given: Not Answered Tobacco comments: Currently smokes about 1 cigarette a day.    Clinical  Intake:  Pre-visit preparation completed: Yes  Pain : 0-10 Pain Score: 4  Pain Type: Chronic pain Pain Location: Generalized Pain Descriptors / Indicators: Aching Pain Onset: More than a month ago Pain Frequency: Constant     Nutritional Risks: None Diabetes: No  Lab Results  Component Value Date   HGBA1C 5.9 07/02/2021   HGBA1C 5.8 01/31/2021   HGBA1C 6.0 05/18/2020     How often do you need to have someone help  you when you read instructions, pamphlets, or other written materials from your doctor or pharmacy?: 1 - Never  Interpreter Needed?: No  Information entered by :: NAllen LPN   Activities of Daily Living     03/22/2024    2:16 PM  In your present state of health, do you have any difficulty performing the following activities:  Hearing? 0  Vision? 0  Difficulty concentrating or making decisions? 0  Walking or climbing stairs? 1  Dressing or bathing? 0  Doing errands, shopping? 0  Preparing Food and eating ? N  Using the Toilet? N  In the past six months, have you accidently leaked urine? N  Do you have problems with loss of bowel control? N  Managing your Medications? N  Managing your Finances? N  Housekeeping or managing your Housekeeping? N    Patient Care Team: Loyola Mast, MD as PCP - General (Family Medicine) Richarda Overlie, MD as Consulting Physician (Obstetrics and Gynecology)  Indicate any recent Medical Services you may have received from other than [facility] providers in the past year (date may be approximate).     Assessment:   This is a routine wellness examination for Holden.  Hearing/Vision screen Hearing Screening - Comments:: Denies hearing issues Vision Screening - Comments:: Regular eye exams, [place]   Goals Addressed             This Visit's Progress    Patient Stated       03/22/2024, start silver sneakers       Depression Screen     03/22/2024    2:39 PM 11/25/2023    1:49 PM 09/16/2023    4:13 PM 02/26/2023     2:57 PM 03/21/2022    8:55 AM 01/31/2021    9:35 AM 09/02/2019    8:24 AM  PHQ 2/9 Scores  PHQ - 2 Score 1 1 1 1  0 1 0    Fall Risk     03/22/2024    2:37 PM 11/25/2023    1:49 PM 09/16/2023    4:12 PM 02/26/2023    2:57 PM 12/11/2021    3:08 PM  Fall Risk   Falls in the past year? 0 0 0 0 0  Number falls in past yr: 0 0 0 0 0  Injury with Fall? 0 0  0 0  Risk for fall due to : Medication side effect No Fall Risks No Fall Risks No Fall Risks   Follow up Falls prevention discussed;Falls evaluation completed Falls evaluation completed Falls evaluation completed      MEDICARE RISK AT HOME:  Medicare Risk at Home Any stairs in or around the home?: Yes If so, are there any without handrails?: No Home free of loose throw rugs in walkways, pet beds, electrical cords, etc?: Yes Adequate lighting in your home to reduce risk of falls?: Yes Life alert?: No Use of a cane, walker or w/c?: No Grab bars in the bathroom?: No Shower chair or bench in shower?: No Elevated toilet seat or a handicapped toilet?: No  TIMED UP AND GO:  Was the test performed?  Yes  Length of time to ambulate 10 feet: 5 sec Gait steady and fast without use of assistive device  Cognitive Function: Normal: Normal cognitive status assessed by direct observation by this Clinical Health Advisor. No abnormalities found. Patient is able to answer questions in an accurate and timely manner.        Immunizations Immunization History  Administered Date(s) Administered  Influenza,inj,Quad PF,6+ Mos 11/19/2017, 10/01/2018   Moderna Sars-Covid-2 Vaccination 07/17/2020, 08/24/2020   Tdap 11/19/2017    Screening Tests Health Maintenance  Topic Date Due   Zoster Vaccines- Shingrix (1 of 2) Never done   Colonoscopy  10/14/2022   COVID-19 Vaccine (3 - 2024-25 season) 08/17/2023   Pneumonia Vaccine 72+ Years old (1 of 2 - PCV) 07/07/2024 (Originally 12/31/1961)   INFLUENZA VACCINE  07/16/2024   MAMMOGRAM  08/28/2024    Medicare Annual Wellness (AWV)  03/22/2025   DTaP/Tdap/Td (2 - Td or Tdap) 11/20/2027   DEXA SCAN  Completed   Hepatitis C Screening  Completed   HPV VACCINES  Aged Out    Health Maintenance  Health Maintenance Due  Topic Date Due   Zoster Vaccines- Shingrix (1 of 2) Never done   Colonoscopy  10/14/2022   COVID-19 Vaccine (3 - 2024-25 season) 08/17/2023   Health Maintenance Items Addressed: Declines covid  and shingles vaccine.  Additional Screening:  Vision Screening: Recommended annual ophthalmology exams for early detection of glaucoma and other disorders of the eye.  Dental Screening: Recommended annual dental exams for proper oral hygiene  Community Resource Referral / Chronic Care Management: CRR required this visit?  No   CCM required this visit?  No     Plan:     I have personally reviewed and noted the following in the patient's chart:   Medical and social history Use of alcohol, tobacco or illicit drugs  Current medications and supplements including opioid prescriptions. Patient is not currently taking opioid prescriptions. Functional ability and status Nutritional status Physical activity Advanced directives List of other physicians Hospitalizations, surgeries, and ER visits in previous 12 months Vitals Screenings to include cognitive, depression, and falls Referrals and appointments  In addition, I have reviewed and discussed with patient certain preventive protocols, quality metrics, and best practice recommendations. A written personalized care plan for preventive services as well as general preventive health recommendations were provided to patient.     Barb Merino, LPN   07/20/1323   After Visit Summary: (In Person-Printed) AVS printed and given to the patient  Notes: Nothing significant to report at this time.

## 2024-03-25 DIAGNOSIS — Z6825 Body mass index (BMI) 25.0-25.9, adult: Secondary | ICD-10-CM | POA: Diagnosis not present

## 2024-03-25 DIAGNOSIS — M5126 Other intervertebral disc displacement, lumbar region: Secondary | ICD-10-CM | POA: Diagnosis not present

## 2024-03-25 DIAGNOSIS — M5412 Radiculopathy, cervical region: Secondary | ICD-10-CM | POA: Diagnosis not present

## 2024-04-05 ENCOUNTER — Other Ambulatory Visit: Payer: Self-pay | Admitting: Neurosurgery

## 2024-04-05 DIAGNOSIS — M5412 Radiculopathy, cervical region: Secondary | ICD-10-CM

## 2024-04-16 ENCOUNTER — Ambulatory Visit: Payer: Self-pay

## 2024-04-16 NOTE — Telephone Encounter (Signed)
 Copied from CRM [id number]. Topic: Clinical - Medication Refill >> Apr 16, 2024 12:07 PM [place] A wrote: Most Recent Primary Care Visit:  Provider: ALLEN, NICKEAH E  Department: [facility]  Visit Type: ANNUAL WELL VISIT, INITIAL  Date: 03/22/2024  Medication: Xanax    Has the patient contacted their pharmacy? No (Agent: If no, request that the patient contact the pharmacy for the refill. If patient does not wish to contact the pharmacy document the reason why and proceed with request.) (Agent: If yes, when and what did the pharmacy advise?)  Is this the correct pharmacy for this prescription? Yes If no, delete pharmacy and type the correct one.  This is the patient's preferred pharmacy:  [id number] - [place], [place] - [address] [place] [address] Phone: [phone] Fax: [phone]   Has the prescription been filled recently? No  Is the patient out of the medication? Yes  Has the patient been seen for an appointment in the last year OR does the patient have an upcoming appointment? Yes  Can we respond through MyChart? No  Agent: Please be advised that Rx refills may take up to 3 business days. We ask that you follow-up with your pharmacy.   Chief Complaint: medication questions Symptoms: n/a Frequency: n/a Pertinent Negatives: Patient denies . Disposition: [] ED /[] Urgent Care (no appt availability in office) / [] Appointment(In office/virtual)/ []  [place] [facility]/ [] Home Care/ [] Refused Recommended Disposition /[] [place] [facility]/ [x]  Follow-up with PCP Additional Notes: Patient scheduled for MRI on 5/3, requesting 1 time dose for Xanax  for claustrophobia. Pt PCP not in office at this time, call placed to [facility] to see if another provider would order dose, they are unable to do so, pt made aware and verbalized understanding.  Reason for Disposition  [1] Caller has URGENT medicine question about med that PCP or specialist  prescribed AND [2] triager unable to answer question  Answer Assessment - Initial Assessment Questions 1. NAME of MEDICINE: "What medicine(s) are you calling about?"     Xanax     2. QUESTION: "What is your question?" (e.g., double dose of medicine, side effect)     Need medication for MRI on 5/3  3. PRESCRIBER: "Who prescribed the medicine?" Reason: if prescribed by specialist, call should be referred to that group.     Dr. Talia Aron  4. SYMPTOMS: "Do you have any symptoms?" If Yes, ask: "What symptoms are you having?"  "How bad are the symptoms (e.g., mild, moderate, severe)     No  5. PREGNANCY:  "Is there any chance that you are pregnant?" "When was your last menstrual period?"     No  Protocols used: Medication Question Call-A-AH

## 2024-04-16 NOTE — Telephone Encounter (Signed)
 PCP OOO. Only one provider in the office at the moment, with no appointments available. Not able to send in script for Xanax  without pt being seen. Advised against taking expired medication.

## 2024-04-17 ENCOUNTER — Ambulatory Visit
Admission: RE | Admit: 2024-04-17 | Discharge: 2024-04-17 | Disposition: A | Discharge status: Home / Self Care | Source: Ambulatory Visit | Attending: Neurosurgery | Admitting: Neurosurgery

## 2024-04-17 DIAGNOSIS — M4802 Spinal stenosis, cervical region: Secondary | ICD-10-CM | POA: Diagnosis not present

## 2024-04-17 DIAGNOSIS — M5412 Radiculopathy, cervical region: Secondary | ICD-10-CM

## 2024-04-17 DIAGNOSIS — M4722 Other spondylosis with radiculopathy, cervical region: Secondary | ICD-10-CM | POA: Diagnosis not present

## 2024-05-04 DIAGNOSIS — M5412 Radiculopathy, cervical region: Secondary | ICD-10-CM | POA: Diagnosis not present

## 2024-05-05 DIAGNOSIS — H524 Presbyopia: Secondary | ICD-10-CM | POA: Diagnosis not present

## 2024-05-05 DIAGNOSIS — H5203 Hypermetropia, bilateral: Secondary | ICD-10-CM | POA: Diagnosis not present

## 2024-05-05 DIAGNOSIS — H52223 Regular astigmatism, bilateral: Secondary | ICD-10-CM | POA: Diagnosis not present

## 2024-05-05 DIAGNOSIS — H18523 Epithelial (juvenile) corneal dystrophy, bilateral: Secondary | ICD-10-CM | POA: Diagnosis not present

## 2024-05-18 DIAGNOSIS — M5451 Vertebrogenic low back pain: Secondary | ICD-10-CM | POA: Diagnosis not present

## 2024-05-18 DIAGNOSIS — M542 Cervicalgia: Secondary | ICD-10-CM | POA: Diagnosis not present

## 2024-05-20 DIAGNOSIS — M542 Cervicalgia: Secondary | ICD-10-CM | POA: Diagnosis not present

## 2024-05-20 DIAGNOSIS — M5451 Vertebrogenic low back pain: Secondary | ICD-10-CM | POA: Diagnosis not present

## 2024-05-25 DIAGNOSIS — M5451 Vertebrogenic low back pain: Secondary | ICD-10-CM | POA: Diagnosis not present

## 2024-05-25 DIAGNOSIS — M542 Cervicalgia: Secondary | ICD-10-CM | POA: Diagnosis not present

## 2024-05-27 DIAGNOSIS — M542 Cervicalgia: Secondary | ICD-10-CM | POA: Diagnosis not present

## 2024-05-27 DIAGNOSIS — M5451 Vertebrogenic low back pain: Secondary | ICD-10-CM | POA: Diagnosis not present

## 2024-06-07 ENCOUNTER — Encounter: Payer: Self-pay | Admitting: Family Medicine

## 2024-06-07 DIAGNOSIS — M542 Cervicalgia: Secondary | ICD-10-CM | POA: Diagnosis not present

## 2024-06-07 DIAGNOSIS — M5451 Vertebrogenic low back pain: Secondary | ICD-10-CM | POA: Diagnosis not present

## 2024-06-10 DIAGNOSIS — M542 Cervicalgia: Secondary | ICD-10-CM | POA: Diagnosis not present

## 2024-06-10 DIAGNOSIS — M5451 Vertebrogenic low back pain: Secondary | ICD-10-CM | POA: Diagnosis not present

## 2024-06-14 DIAGNOSIS — M542 Cervicalgia: Secondary | ICD-10-CM | POA: Diagnosis not present

## 2024-06-14 DIAGNOSIS — M5451 Vertebrogenic low back pain: Secondary | ICD-10-CM | POA: Diagnosis not present

## 2024-06-17 DIAGNOSIS — M5451 Vertebrogenic low back pain: Secondary | ICD-10-CM | POA: Diagnosis not present

## 2024-06-17 DIAGNOSIS — M542 Cervicalgia: Secondary | ICD-10-CM | POA: Diagnosis not present

## 2024-06-21 ENCOUNTER — Ambulatory Visit: Payer: Self-pay

## 2024-06-21 NOTE — Telephone Encounter (Signed)
    Triage Disposition: See Physician Within 24 Hours Has apt on Friday; soonest available is Friday. Patient/caregiver understands and will follow disposition?: Yes Copied from CRM [id number]. Topic: Clinical - Red Word Triage >> Jun 21, 2024 10:37 AM Robinson DEL wrote: [facility] that prompted transfer to Nurse Triage: Bad diarrhea yesterday and had blood in stool with mucus in it Reason for Disposition  [1] MODERATE diarrhea (e.g., 4-6 times / day more than normal) AND [2] present > 48 hours (2 days)  Answer Assessment - Initial Assessment Questions 1. DIARRHEA SEVERITY: How bad is the diarrhea? How many more stools have you had in the past 24 hours than normal?    - NO DIARRHEA (SCALE 0)   - MILD (SCALE 1-3): Few loose or mushy BMs; increase of 1-3 stools over normal daily number of stools; mild increase in ostomy output.   -  MODERATE (SCALE 4-7): Increase of 4-6 stools daily over normal; moderate increase in ostomy output.   -  SEVERE (SCALE 8-10; OR WORST POSSIBLE): Increase of 7 or more stools daily over normal; moderate increase in ostomy output; incontinence.     2 times, mild 2. ONSET: When did the diarrhea begin?      yesterday 3. BM CONSISTENCY: How loose or watery is the diarrhea?      Bloody with mucous in diarrhea, medium brown in color 4. VOMITING: Are you also vomiting? If Yes, ask: How many times in the past 24 hours?      no 5. ABDOMEN PAIN: Are you having any abdomen pain? If Yes, ask: What does it feel like? (e.g., crampy, dull, intermittent, constant)      denies 6. ABDOMEN PAIN SEVERITY: If present, ask: How bad is the pain?  (e.g., Scale 1-10; mild, moderate, or severe)   - MILD (1-3): doesn't interfere with normal activities, abdomen soft and not tender to touch    - MODERATE (4-7): interferes with normal activities or awakens from sleep, abdomen tender to touch    - SEVERE (8-10): excruciating pain, doubled over, unable to do any normal activities        denies 7. ORAL INTAKE: If vomiting, Have you been able to drink liquids? How much liquids have you had in the past 24 hours?     na 8. HYDRATION: Any signs of dehydration? (e.g., dry mouth [not just dry lips], too weak to stand, dizziness, new weight loss) When did you last urinate?     no 9. EXPOSURE: Have you traveled to a foreign country recently? Have you been exposed to anyone with diarrhea? Could you have eaten any food that was spoiled?     no 10. ANTIBIOTIC USE: Are you taking antibiotics now or have you taken antibiotics in the past 2 months?       no 11. OTHER SYMPTOMS: Do you have any other symptoms? (e.g., fever, blood in stool)       no 12. PREGNANCY: Is there any chance you are pregnant? When was your last menstrual period?       na  Protocols used: [facility]

## 2024-06-21 NOTE — Telephone Encounter (Signed)
 Appointment scheduled 06/25/24. Dm/cma

## 2024-06-22 DIAGNOSIS — M542 Cervicalgia: Secondary | ICD-10-CM | POA: Diagnosis not present

## 2024-06-22 DIAGNOSIS — M5451 Vertebrogenic low back pain: Secondary | ICD-10-CM | POA: Diagnosis not present

## 2024-06-25 ENCOUNTER — Encounter: Payer: Self-pay | Admitting: Family Medicine

## 2024-06-25 ENCOUNTER — Ambulatory Visit (INDEPENDENT_AMBULATORY_CARE_PROVIDER_SITE_OTHER): Admitting: Family Medicine

## 2024-06-25 VITALS — BP 142/78 | HR 64 | Temp 97.9°F | Ht 69.0 in | Wt 170.2 lb

## 2024-06-25 DIAGNOSIS — I10 Essential (primary) hypertension: Secondary | ICD-10-CM

## 2024-06-25 DIAGNOSIS — E785 Hyperlipidemia, unspecified: Secondary | ICD-10-CM

## 2024-06-25 DIAGNOSIS — R739 Hyperglycemia, unspecified: Secondary | ICD-10-CM

## 2024-06-25 DIAGNOSIS — M5451 Vertebrogenic low back pain: Secondary | ICD-10-CM | POA: Diagnosis not present

## 2024-06-25 DIAGNOSIS — M4802 Spinal stenosis, cervical region: Secondary | ICD-10-CM | POA: Diagnosis not present

## 2024-06-25 DIAGNOSIS — M542 Cervicalgia: Secondary | ICD-10-CM | POA: Diagnosis not present

## 2024-06-25 HISTORY — DX: Spinal stenosis, cervical region: M48.02

## 2024-06-25 MED ORDER — EZETIMIBE 10 MG PO TABS: 10.0000 mg | ORAL_TABLET | Freq: Every day | ORAL | 3 refills | Status: DC

## 2024-06-25 NOTE — Assessment & Plan Note (Signed)
 Working with Dr. Onetha (neurosurgery).

## 2024-06-25 NOTE — Assessment & Plan Note (Signed)
 I will reassess lipids. Continue ezetimibe  10 mg daily.

## 2024-06-25 NOTE — Progress Notes (Signed)
 [facility] PRIMARY CARE [facility] [address] [place] [address] Dept: [phone] Dept Fax: [phone]  Chronic Care Office Visit  Subjective:    Patient ID: [name] [name], female    DOB: February 20, 1956, 68 y.o..   MRN: [id number]  Chief Complaint  Patient presents with   Hypertension    4 month f/u HTN.     History of Present Illness:  Patient is in today for reassessment of chronic medical issues.  [name] [name] has a history of hypertension. She is currently off of medication. Her stress level is higher today due to grief over the death of a chil who is friends with her grandson. The funeral was today. She also shares that since her last  visit, her sister died. She feels she is adjusting to these deaths and is working through her grief.   [name] [name] has a history of hyperlipidemia. She is currently managed on Zetia  10 mg daily.   [name] [name] has a history of severe spinal stenosis. More recently, she has noted issues with bilateral numbness of her thumbs and the lateral aspect of the forearm. She is not specifically having neck pain. She does get pains in the upper back on either side of the upper thoracic spine.  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 Dorthula   LAPAROSCOPIC ASSISTED VAGINAL HYSTERECTOMY  01-25-2003   LAPAROSCOPY N/A 04/18/2014   Procedure: LAPAROSCOPY DIAGNOSTIC;  Surgeon: Charlie CHRISTELLA Croak, MD;  Location: [facility];  Service: Gynecology;  Laterality: N/A;   NASAL SEPTUM SURGERY  AGE 3   OPEN REDUCTION INTERNAL FIXATION (ORIF) DISTAL RADIAL FRACTURE Left 09/18/2020   Procedure: OPEN TREATMENT OF LEFT DISTAL RADIUS FRACTURE;  Surgeon: Sebastian Lenis, MD;  Location:  SURGERY CENTER;  Service: Orthopedics;  Laterality: Left;  LENGTH OF SURGERY: 75 MIN  PRE OP BLOCK   SALPINGOOPHORECTOMY Bilateral 04/18/2014   Procedure: SALPINGO OOPHORECTOMY;  Surgeon: Charlie CHRISTELLA Croak, MD;  Location: [facility];  Service: Gynecology;  Laterality: Bilateral;   TONSILLECTOMY  AGE 20   TUBAL LIGATION     Family History  Problem Relation Age of Onset   Diabetes Mother    Heart disease Mother    Cancer Father        Lung   Diabetes Sister    Diabetes Sister    Colon cancer Neg Hx    Stomach cancer Neg Hx    Outpatient Medications Prior to Visit  Medication Sig Dispense Refill   Calcium Carbonate (CALCIUM 600 PO) Take by mouth every other day.     Cholecalciferol (VITAMIN D3) 2000 UNITS TABS  Take 1 capsule by mouth daily.     fluticasone  (FLONASE ) 50 MCG/ACT nasal spray Place 1 spray into both nostrils daily.     guaiFENesin -codeine  100-10 MG/5ML syrup Take 5 mLs by mouth 3 (three) times daily as needed.     Magnesium 250 MG TABS Take 1 tablet by mouth daily.     pseudoephedrine -guaifenesin  (MUCINEX  D) 60-600 MG 12 hr tablet Take 1 tablet by mouth every 12 (twelve) hours. 20 tablet 0   vitamin E 400 UNIT capsule Take 400 Units by mouth daily.      ezetimibe  (ZETIA ) 10 MG tablet TAKE 1 TABLET BY MOUTH EVERY DAY 90 tablet 3   No facility-administered medications  prior to visit.   Allergies  Allergen Reactions   Hydrocodone  Rash   Demerol [Meperidine] Nausea And Vomiting    severe   Molds & Smuts    Sulfa Antibiotics Swelling and Rash   Objective:   Today's Vitals   06/25/24 1501  BP: (!) 142/78  Pulse: 64  Temp: 97.9 F (36.6 C)  TempSrc: Temporal  SpO2: 98%  Weight: 170 lb 3.2 oz (77.2 kg)  Height: 5' 9 (1.753 m)   Body mass index is 25.13 kg/m.      06/25/2024    3:01 PM 03/22/2024    2:13 PM 02/25/2024    3:40 PM  Vitals with BMI  Height 5' 9 5' 9 5' 8  Weight 170 lbs 3 oz 170 lbs 170 lbs  BMI 25.12 25.09 25.85  Systolic 142 136 879  Diastolic 78 64 76  Pulse 64 70 76   General: Well developed, well nourished. No acute distress. Psych: Alert and oriented. Normal mood and affect.  Health Maintenance Due  Topic Date Due   Zoster Vaccines- Shingrix (1 of 2) Never done   Colonoscopy  10/14/2022   Imaging: MR Cervical Spine wo contrast (04/17/2024) IMPRESSION: 1. Moderate canal stenosis at C6-C7 secondary to disc osteophyte complex. Severe foraminal stenoses at this level secondary to uncovertebral joint disease. 2. Severe left foraminal stenosis at C5-C6 secondary to uncovertebral joint disease. 3. Moderate foraminal stenoses bilaterally at C3-C4 secondary to uncovertebral joint disease and facet arthropathy.    Assessment & Plan:   Problem List Items Addressed This Visit       Cardiovascular and Mediastinum   Essential hypertension - Primary   BP is high today, ut his average over the past 3 visits has been at goal. We will continue to monitor this for now.      Relevant Medications   ezetimibe  (ZETIA ) 10 MG tablet   Other Relevant Orders   Basic metabolic panel with GFR     Other   Hyperlipidemia   I will reassess lipids. Continue ezetimibe  10 mg daily.      Relevant Medications   ezetimibe  (ZETIA ) 10 MG tablet   Other Relevant Orders   Lipid panel   Spinal stenosis in cervical region   Working  with Dr. Onetha (neurosurgery).      Other Visit Diagnoses       Hyperglycemia       There have been some recent elevated blood sugars. I will reassess her glucose and A1c.   Relevant Orders   Hemoglobin A1c   Basic metabolic panel with GFR       Return in about 3 months (around 09/25/2024) for Reassessment.   Garnette CHRISTELLA Simpler, MD

## 2024-06-25 NOTE — Assessment & Plan Note (Signed)
 BP is high today, ut his average over the past 3 visits has been at goal. We will continue to monitor this for now.

## 2024-06-28 ENCOUNTER — Ambulatory Visit: Payer: Self-pay | Admitting: Family Medicine

## 2024-06-28 ENCOUNTER — Other Ambulatory Visit (INDEPENDENT_AMBULATORY_CARE_PROVIDER_SITE_OTHER)

## 2024-06-28 DIAGNOSIS — E785 Hyperlipidemia, unspecified: Secondary | ICD-10-CM | POA: Diagnosis not present

## 2024-06-28 DIAGNOSIS — R739 Hyperglycemia, unspecified: Secondary | ICD-10-CM

## 2024-06-28 DIAGNOSIS — I10 Essential (primary) hypertension: Secondary | ICD-10-CM

## 2024-06-28 LAB — LIPID PANEL
Cholesterol: 188 mg/dL (ref 0–200)
HDL: 39.8 mg/dL (ref >39.00)
LDL Cholesterol: 123 mg/dL — ABNORMAL HIGH (ref 0–99)
NonHDL: 148.09
Total CHOL/HDL Ratio: 5
Triglycerides: 125 mg/dL (ref 0.0–149.0)
VLDL: 25 mg/dL (ref 0.0–40.0)

## 2024-06-28 LAB — BASIC METABOLIC PANEL WITH GFR
BUN: 15 mg/dL (ref 6–23)
CO2: 29 meq/L (ref 19–32)
Calcium: 9.5 mg/dL (ref 8.4–10.5)
Chloride: 106 meq/L (ref 96–112)
Creatinine, Ser: 0.82 mg/dL (ref 0.40–1.20)
GFR: 74.5 mL/min (ref >60.00)
Glucose, Bld: 104 mg/dL — ABNORMAL HIGH (ref 70–99)
Potassium: 4.5 meq/L (ref 3.5–5.1)
Sodium: 141 meq/L (ref 135–145)

## 2024-06-28 LAB — HEMOGLOBIN A1C: Hgb A1c MFr Bld: 6.1 % (ref 4.6–6.5)

## 2024-06-29 DIAGNOSIS — M5451 Vertebrogenic low back pain: Secondary | ICD-10-CM | POA: Diagnosis not present

## 2024-06-29 DIAGNOSIS — M542 Cervicalgia: Secondary | ICD-10-CM | POA: Diagnosis not present

## 2024-07-02 DIAGNOSIS — M5451 Vertebrogenic low back pain: Secondary | ICD-10-CM | POA: Diagnosis not present

## 2024-07-02 DIAGNOSIS — M542 Cervicalgia: Secondary | ICD-10-CM | POA: Diagnosis not present

## 2024-07-06 DIAGNOSIS — Z6825 Body mass index (BMI) 25.0-25.9, adult: Secondary | ICD-10-CM | POA: Diagnosis not present

## 2024-07-06 DIAGNOSIS — M542 Cervicalgia: Secondary | ICD-10-CM | POA: Diagnosis not present

## 2024-07-06 DIAGNOSIS — M5451 Vertebrogenic low back pain: Secondary | ICD-10-CM | POA: Diagnosis not present

## 2024-07-06 DIAGNOSIS — M5412 Radiculopathy, cervical region: Secondary | ICD-10-CM | POA: Diagnosis not present

## 2024-07-07 ENCOUNTER — Encounter: Payer: Self-pay | Admitting: Nurse Practitioner

## 2024-07-07 ENCOUNTER — Ambulatory Visit (INDEPENDENT_AMBULATORY_CARE_PROVIDER_SITE_OTHER): Admitting: Nurse Practitioner

## 2024-07-07 VITALS — BP 160/70 | HR 60 | Temp 97.8°F | Ht 69.0 in | Wt 169.0 lb

## 2024-07-07 DIAGNOSIS — E782 Mixed hyperlipidemia: Secondary | ICD-10-CM

## 2024-07-07 DIAGNOSIS — I10 Essential (primary) hypertension: Secondary | ICD-10-CM | POA: Diagnosis not present

## 2024-07-07 DIAGNOSIS — M5412 Radiculopathy, cervical region: Secondary | ICD-10-CM

## 2024-07-07 DIAGNOSIS — M48061 Spinal stenosis, lumbar region without neurogenic claudication: Secondary | ICD-10-CM | POA: Diagnosis not present

## 2024-07-07 MED ORDER — ALPRAZOLAM 0.25 MG PO TABS
0.2500 mg | ORAL_TABLET | Freq: Once | ORAL | 0 refills | Status: AC
Start: 2024-07-07 — End: 2024-07-07

## 2024-07-07 NOTE — Progress Notes (Unsigned)
 Established Patient Office Visit  Subjective   Patient ID: [name] [name], female    DOB: Nov 09, 1956  Age: 68 y.o. MRN: [id number]  Chief Complaint  Patient presents with   Blood Pressure and Imaging    Concerns with blood pressure up and down and questions regarding a CT Scan    HPI Discussed the use of AI scribe software for clinical note transcription with the patient, who gave verbal consent to proceed.  History of Present Illness   [name] [name] is a 68 year old female with hypertension who presents with concerns about fluctuating blood pressure.  She experiences fluctuating blood pressure readings, with a recent high of 203/103 and subsequent readings of 173/70 and 155/50 after physical therapy session. At home, her blood pressure ranges from 179/74 to 129/74 after resting. Her blood pressure is initially high but decreases with relaxation. She experiences anxiety, particularly in medical settings, which may contribute to elevated readings. She denies chest pain but has occasional dull headaches across her forehead.  She has severe spinal stenosis, causing significant discomfort. She is undergoing physical therapy for her neck and back, which provides some relief. She experiences pain in her buttocks and occasional numbness in her toes and thumb. She also has fibromyalgia. She occasionally uses ibuprofen  200 mg for pain. She exercises in her swimming pool and has joined a gym, though some exercises exacerbate her pain.    A family member on her husband's side is terminally ill, adding stress. She previously used Xanax  for anxiety but has not taken it recently. She sometimes struggles with sleep and uses a pillow between her knees to alleviate neck pain.   She is taking zetia  10mg  daily for cholesterol. She does not tolerate statins due to muscle pain. She has a CT heart scheduled but is anxious about the test and would like to have a xanax  to take before the scan. She  said that her husband will drive her to the imaging center.       ROS See pertinent positives and negatives per HPI.    Objective:     BP (!) 160/70 (BP Location: Left Arm, Cuff Size: Normal)   Pulse 60   Temp 97.8 F (36.6 C)   Ht 5' 9 (1.753 m)   Wt 169 lb (76.7 kg)   SpO2 97%   BMI 24.96 kg/m  BP Readings from Last 3 Encounters:  07/07/24 (!) 160/70  06/25/24 (!) 142/78  03/22/24 136/64   Wt Readings from Last 3 Encounters:  07/07/24 169 lb (76.7 kg)  06/25/24 170 lb 3.2 oz (77.2 kg)  03/22/24 170 lb (77.1 kg)      Physical Exam Vitals and nursing note reviewed.  Constitutional:      General: She is not in acute distress.    Appearance: Normal appearance.  HENT:     Head: Normocephalic.  Eyes:     Conjunctiva/sclera: Conjunctivae normal.  Cardiovascular:     Rate and Rhythm: Normal rate and regular rhythm.     Pulses: Normal pulses.     Heart sounds: Normal heart sounds.  Pulmonary:     Effort: Pulmonary effort is normal.     Breath sounds: Normal breath sounds.  Musculoskeletal:     Cervical back: Normal range of motion.  Skin:    General: Skin is warm.  Neurological:     General: No focal deficit present.     Mental Status: She is alert and oriented to person, place, and  time.  Psychiatric:        Mood and Affect: Mood normal.        Behavior: Behavior normal.        Thought Content: Thought content normal.        Judgment: Judgment normal.    The 10-year ASCVD risk score (Arnett DK, et al., 2019) is: 18%    Assessment & Plan:   Problem List Items Addressed This Visit       Cardiovascular and Mediastinum   Essential hypertension   Intermittent elevated systolic blood pressure is influenced by anxiety and pain. She is not on antihypertensive medication currently. Monitor blood pressure daily at home and document readings to identify trends. Consider antihypertensive medication if systolic readings remain elevated. Recent BMP reviewed.          Nervous and Auditory   Cervical radiculopathy at C5 - Primary   Chronic, ongoing. Severe cervical spinal stenosis with a pinched nerve is causing pain and numbness. She declined surgery due to osteoporosis risks and opts for non-surgical management. Continue physical therapy for neck and back. Use acetaminophen  arthritis as needed for pain and consider lidocaine  patches for localized pain. Avoid daily NSAIDs to prevent hypertension. Continue collaboration and recommendations from neurosurgery.         Other   Hyperlipidemia   She has intermediate cardiovascular risk and is intolerant to statins, currently on ezetimibe  10mg  daily. A CT scan for coronary artery calcium score is discussed to guide further treatment. Continue ezetimibe  for cholesterol management and consider omega-3 supplements. Schedule a CT scan for coronary artery calcium score assessment. She has anxiety with imaging and is requesting xanax  before the test. PDMP reviewed. Take xanax  0.25mg  once 30 minutes before CT scan insuring she has someone drive her to and from the imaging center.       Spinal stenosis at L4-L5 level   Chronic, ongoing. Severe cervical spinal stenosis with a pinched nerve is causing pain and numbness. She declined surgery due to osteoporosis risks and opts for non-surgical management. Continue physical therapy for neck and back. Use acetaminophen  arthritis as needed for pain and consider lidocaine  patches for localized pain. Avoid daily NSAIDs to prevent hypertension. Continue collaboration and recommendations from neurosurgery.       Return in about 4 weeks (around 08/04/2024), or if symptoms worsen or fail to improve, for HTN with Dr. Thedora.    Tinnie DELENA Harada, NP

## 2024-07-07 NOTE — Patient Instructions (Signed)
 It was great to see you!  Start checking your blood pressure every morning and write it down  I have sent in xanax  to take 1 tablet before your CT scan, you will need to have someone drive you there  Start tylenol  arthritis as needed for pain or use a lidocaine  patch - you can get this at any pharmacy  Let's follow-up at your next appointment with Dr. Thedora  Take care,  Tinnie Harada, NP

## 2024-07-08 ENCOUNTER — Ambulatory Visit (HOSPITAL_BASED_OUTPATIENT_CLINIC_OR_DEPARTMENT_OTHER)
Admission: RE | Admit: 2024-07-08 | Discharge: 2024-07-08 | Disposition: A | Discharge status: Home / Self Care | Payer: Self-pay | Source: Ambulatory Visit | Attending: Family Medicine | Admitting: Family Medicine

## 2024-07-08 ENCOUNTER — Encounter: Admitting: Family Medicine

## 2024-07-08 DIAGNOSIS — E785 Hyperlipidemia, unspecified: Secondary | ICD-10-CM | POA: Insufficient documentation

## 2024-07-08 NOTE — Assessment & Plan Note (Signed)
 Chronic, ongoing. Severe cervical spinal stenosis with a pinched nerve is causing pain and numbness. She declined surgery due to osteoporosis risks and opts for non-surgical management. Continue physical therapy for neck and back. Use acetaminophen  arthritis as needed for pain and consider lidocaine  patches for localized pain. Avoid daily NSAIDs to prevent hypertension. Continue collaboration and recommendations from neurosurgery.

## 2024-07-08 NOTE — Assessment & Plan Note (Signed)
 She has intermediate cardiovascular risk and is intolerant to statins, currently on ezetimibe  10mg  daily. A CT scan for coronary artery calcium score is discussed to guide further treatment. Continue ezetimibe  for cholesterol management and consider omega-3 supplements. Schedule a CT scan for coronary artery calcium score assessment. She has anxiety with imaging and is requesting xanax  before the test. PDMP reviewed. Take xanax  0.25mg  once 30 minutes before CT scan insuring she has someone drive her to and from the imaging center.

## 2024-07-08 NOTE — Assessment & Plan Note (Signed)
 Intermittent elevated systolic blood pressure is influenced by anxiety and pain. She is not on antihypertensive medication currently. Monitor blood pressure daily at home and document readings to identify trends. Consider antihypertensive medication if systolic readings remain elevated. Recent BMP reviewed.

## 2024-07-09 DIAGNOSIS — M542 Cervicalgia: Secondary | ICD-10-CM | POA: Diagnosis not present

## 2024-07-09 DIAGNOSIS — M5451 Vertebrogenic low back pain: Secondary | ICD-10-CM | POA: Diagnosis not present

## 2024-07-12 ENCOUNTER — Ambulatory Visit: Payer: Self-pay | Admitting: Family Medicine

## 2024-07-13 DIAGNOSIS — M542 Cervicalgia: Secondary | ICD-10-CM | POA: Diagnosis not present

## 2024-07-13 DIAGNOSIS — M5451 Vertebrogenic low back pain: Secondary | ICD-10-CM | POA: Diagnosis not present

## 2024-07-16 DIAGNOSIS — M542 Cervicalgia: Secondary | ICD-10-CM | POA: Diagnosis not present

## 2024-07-16 DIAGNOSIS — M5451 Vertebrogenic low back pain: Secondary | ICD-10-CM | POA: Diagnosis not present

## 2024-07-19 ENCOUNTER — Telehealth: Payer: Self-pay

## 2024-07-19 DIAGNOSIS — Z789 Other specified health status: Secondary | ICD-10-CM

## 2024-07-19 DIAGNOSIS — I2510 Atherosclerotic heart disease of native coronary artery without angina pectoris: Secondary | ICD-10-CM

## 2024-07-19 DIAGNOSIS — E782 Mixed hyperlipidemia: Secondary | ICD-10-CM

## 2024-07-19 DIAGNOSIS — Z720 Tobacco use: Secondary | ICD-10-CM

## 2024-07-19 NOTE — Telephone Encounter (Signed)
 Left VM that results haven't been reviewed as of yet.  Will let provider know and then get back to her after .  Dm/cma

## 2024-07-19 NOTE — Telephone Encounter (Signed)
 Copied from CRM [id number]. Topic: Clinical - Lab/Test Results >> Jul 16, 2024  2:12 PM Robinson H wrote: Reason for CRM: Patient is calling to obtain CT results that was done on 7/24, please reach out, thanks.  [place] [phone]

## 2024-07-20 ENCOUNTER — Ambulatory Visit: Payer: Self-pay

## 2024-07-20 DIAGNOSIS — I2510 Atherosclerotic heart disease of native coronary artery without angina pectoris: Secondary | ICD-10-CM | POA: Insufficient documentation

## 2024-07-20 DIAGNOSIS — Z789 Other specified health status: Secondary | ICD-10-CM | POA: Insufficient documentation

## 2024-07-20 HISTORY — DX: Atherosclerotic heart disease of native coronary artery without angina pectoris: I25.10

## 2024-07-20 HISTORY — DX: Other specified health status: Z78.9

## 2024-07-20 NOTE — Telephone Encounter (Signed)
 Left detailed VM that referral was placed and they will reach out to her. Dm/cma

## 2024-07-20 NOTE — Telephone Encounter (Signed)
 Can you review the CT results.   Thanks Dm/cma

## 2024-07-20 NOTE — Telephone Encounter (Signed)
 Copied from CRM #[id number]. Topic: Referral - Status >> Jul 20, 2024 12:15 PM Jayma L wrote: Reason for CRM: patient called in stated she got her results in the mail from recent CT scan , said she seen in the latter where it states she needs to be seen by a cardiologist and she agrees that is a great idea. Asking to be sent to anyone in her network that takes her insurance pleas. Please callback patient with the correct information about the referral.

## 2024-07-20 NOTE — Addendum Note (Signed)
 Addended by: THEDORA GARNETTE HERO on: 07/20/2024 10:20 AM   Modules accepted: Orders

## 2024-07-20 NOTE — Telephone Encounter (Signed)
 FYI Only or Action Required?: FYI only for provider.  Patient was last seen in primary care on 07/07/2024 by Nedra Tinnie LABOR, NP.  Called Nurse Triage reporting Referral.  Symptoms began today.  Interventions attempted: Nothing.  Symptoms are: stable.  Triage Disposition: Call PCP When Office is Open  Patient/caregiver understands and will follow disposition?: Yes   **See note below**           Copied from CRM #[id number]. Topic: Clinical - Red Word Triage >> Jul 20, 2024 12:23 PM Jayma L wrote: Red Word that prompted transfer to Nurse Triage: patient called in and stated she had to stop physical therapy on Friday due to getting dizzy , said she's been dealing with anxiety as well and not sure if it goes hang and hand but wanted to let her pcp and nurse know. Reason for Disposition  [1] Caller requesting NON-URGENT health information AND [2] PCP's office is the best resource  Answer Assessment - Initial Assessment Questions 1. REASON FOR CALL: What is the main reason for your call? or How can I best help you?    Patient was advised that she would be referred to a cardiologist, she is calling back to get the information for the referral, as she received the letter, but stated no provider was listed. She reported no current symptoms during the call, only wants referral information.  Protocols used: Information Only Call - No Triage-A-AH

## 2024-07-21 DIAGNOSIS — M5451 Vertebrogenic low back pain: Secondary | ICD-10-CM | POA: Diagnosis not present

## 2024-07-21 DIAGNOSIS — M542 Cervicalgia: Secondary | ICD-10-CM | POA: Diagnosis not present

## 2024-07-26 DIAGNOSIS — M5451 Vertebrogenic low back pain: Secondary | ICD-10-CM | POA: Diagnosis not present

## 2024-07-26 DIAGNOSIS — M542 Cervicalgia: Secondary | ICD-10-CM | POA: Diagnosis not present

## 2024-07-27 ENCOUNTER — Other Ambulatory Visit: Payer: Self-pay

## 2024-07-27 DIAGNOSIS — R030 Elevated blood-pressure reading, without diagnosis of hypertension: Secondary | ICD-10-CM | POA: Insufficient documentation

## 2024-07-27 DIAGNOSIS — S8992XA Unspecified injury of left lower leg, initial encounter: Secondary | ICD-10-CM | POA: Insufficient documentation

## 2024-07-27 DIAGNOSIS — R102 Pelvic and perineal pain: Secondary | ICD-10-CM | POA: Insufficient documentation

## 2024-07-27 DIAGNOSIS — M1990 Unspecified osteoarthritis, unspecified site: Secondary | ICD-10-CM | POA: Insufficient documentation

## 2024-07-27 DIAGNOSIS — Z9889 Other specified postprocedural states: Secondary | ICD-10-CM | POA: Insufficient documentation

## 2024-07-27 DIAGNOSIS — R112 Nausea with vomiting, unspecified: Secondary | ICD-10-CM | POA: Insufficient documentation

## 2024-07-28 ENCOUNTER — Other Ambulatory Visit (HOSPITAL_COMMUNITY): Payer: Self-pay

## 2024-07-28 ENCOUNTER — Encounter: Payer: Self-pay | Admitting: Cardiology

## 2024-07-28 ENCOUNTER — Telehealth: Payer: Self-pay | Admitting: Pharmacy Technician

## 2024-07-28 ENCOUNTER — Ambulatory Visit: Attending: Cardiology | Admitting: Cardiology

## 2024-07-28 VITALS — BP 138/52 | HR 59 | Ht 69.0 in | Wt 168.8 lb

## 2024-07-28 DIAGNOSIS — I2510 Atherosclerotic heart disease of native coronary artery without angina pectoris: Secondary | ICD-10-CM

## 2024-07-28 DIAGNOSIS — E782 Mixed hyperlipidemia: Secondary | ICD-10-CM | POA: Diagnosis not present

## 2024-07-28 DIAGNOSIS — I10 Essential (primary) hypertension: Secondary | ICD-10-CM

## 2024-07-28 MED ORDER — METOPROLOL TARTRATE 25 MG PO TABS: 25.0000 mg | ORAL_TABLET | Freq: Once | ORAL | 0 refills | Status: DC

## 2024-07-28 MED ORDER — BEMPEDOIC ACID 180 MG PO TABS: 180.0000 mg | ORAL_TABLET | ORAL | 3 refills | Status: DC

## 2024-07-28 NOTE — Telephone Encounter (Signed)
   Pharmacy Patient Advocate Encounter   Received notification from Onbase that prior authorization for nexletol  is required/requested.   Insurance verification completed.   The patient is insured through [employer] ADVANTAGE/RX ADVANCE .   Per test claim: PA required; PA submitted to above mentioned insurance via Latent Key/confirmation #/EOC [id number] Status is pending   Pharmacy Patient Advocate Encounter  Received notification from [facility] ADVANTAGE/RX ADVANCE that Prior Authorization for nexletol  has been APPROVED from 07/28/24 to 01/24/25  Pharmacy made aware  PA #/Case ID/Reference #: [id number]

## 2024-07-28 NOTE — Patient Instructions (Addendum)
 Medication Instructions:   Your physician has recommended you make the following change in your medication:   START: Bempedoic Acid  180 mg every M - W - F  *If you need a refill on your cardiac medications before your next appointment, please call your pharmacy*  Lab Work: Your physician recommends that you return for lab work in:   Labs today: BMP  If you have labs (blood work) drawn today and your tests are completely normal, you will receive your results only by: MyChart Message (if you have MyChart) OR A paper copy in the mail If you have any lab test that is abnormal or we need to change your treatment, we will call you to review the results.  Testing/Procedures: Vasuscreen    Your cardiac CT will be scheduled at one of the below locations:   [facility] [address] [place], [place] [address] [phone]  OR   [facility] [address] [place], [place] [address] [phone]  OR   [facility] [address] [place], [place] [address] [phone]  OR   [facility] V[facility] Tower [address]  [place], [place] [address]  OR   [facility] [address] [place], [place]  If scheduled at [facility], please arrive at the [facility] and Children's Entrance (Entrance C2) of [facility] 30 minutes prior to test start time. You can use the FREE valet parking offered at entrance C (encouraged to control the heart rate for the test)  Proceed to the [facility] Radiology Department (first floor) to check-in and test prep.  All radiology patients and guests should use entrance C2 at [facility], accessed from [facility], even though the hospital's physical address listed is [address]  If scheduled at the Heart and Vascular Tower at [address], please enter the parking lot using the [address] entrance and use the FREE valet  service at the patient drop-off area. Enter the building and check-in with registration on the main floor.  If scheduled at [facility], please arrive to the Heart and Vascular Center 15 mins early for check-in and test prep.  There is spacious parking and easy access to the radiology department from the [facility] Heart and Vascular entrance. Please enter here and check-in with the desk attendant.   If scheduled at [facility], please arrive 30 minutes early for check-in and test prep.  Please follow these instructions carefully (unless otherwise directed):  On the Night Before the Test: Be sure to Drink plenty of water. Do not consume any caffeinated/decaffeinated beverages or chocolate 12 hours prior to your test. Do not take any antihistamines 12 hours prior to your test.  On the Day of the Test: Drink plenty of water until 1 hour prior to the test. Do not eat any food 1 hour prior to test. You may take your regular medications prior to the test.  Take metoprolol  (Lopressor ) two hours prior to test. Patients who wear a continuous glucose monitor MUST remove the device prior to scanning. FEMALES- please wear underwire-free bra if available, avoid dresses & tight clothing      After the Test: Drink plenty of water. After receiving IV contrast, you may experience a mild flushed feeling. This is normal. On occasion, you may experience a mild rash up to 24 hours after the test. This is not dangerous. If this occurs, you can take  Benadryl 25 mg, Zyrtec, Claritin , or Allegra and increase your fluid intake. (Patients taking Tikosyn should avoid Benadryl, and may take Zyrtec, Claritin , or Allegra) If you experience trouble breathing, this can be serious. If it is severe call 911 IMMEDIATELY. If it is mild, please call our office.  We will call to schedule your test 2-4 weeks out understanding that some insurance companies will need an authorization prior to the service  being performed.   For more information and frequently asked questions, please visit our website : [email or web address]  For non-scheduling related questions, please contact the cardiac imaging nurse navigator should you have any questions/concerns: Cardiac Imaging Nurse Navigators Direct Office Dial: [phone]   For scheduling needs, including cancellations and rescheduling, please call [place], [phone].   Follow-Up: At [facility], you and your health needs are our priority.  As part of our continuing mission to provide you with exceptional heart care, our providers are all part of one team.  This team includes your primary Cardiologist (physician) and Advanced Practice Providers or APPs (Physician Assistants and Nurse Practitioners) who all work together to provide you with the care you need, when you need it.  Your next appointment:   3 month(s)  Provider:   Redell Leiter, MD    We recommend signing up for the patient portal called MyChart.  Sign up information is provided on this After Visit Summary.  MyChart is used to connect with patients for Virtual Visits (Telemedicine).  Patients are able to view lab/test results, encounter notes, upcoming appointments, etc.  Non-urgent messages can be sent to your provider as well.   To learn more about what you can do with MyChart, go to [email or web address].   Other Instructions Purchase a new [employer] blood pressure cuff    [employer]  Tips to measure your blood pressure correctly  To determine whether you have hypertension, a medical professional will take a blood pressure reading. How you prepare for the test, the position of your arm, and other factors can change a blood pressure reading by 10% or more. That could be enough to hide high blood pressure, start you on a drug you don't really need, or lead your doctor to incorrectly adjust your medications. National and international guidelines offer  specific instructions for measuring blood pressure. If a doctor, nurse, or medical assistant isn't doing it right, don't hesitate to ask him or her to get with the guidelines. Here's what you can do to ensure a correct reading:  Don't drink a caffeinated beverage or smoke during the 30 minutes before the test.  Sit quietly for five minutes before the test begins.  During the measurement, sit in a chair with your feet on the floor and your arm supported so your elbow is at about heart level.  The inflatable part of the cuff should completely cover at least 80% of your upper arm, and the cuff should be placed on bare skin, not over a shirt.  Don't talk during the measurement.  Have your blood pressure measured twice, with a brief break in between. If the readings are different by 5 points or more, have it done a third time. There are times to break these rules. If you sometimes feel lightheaded when getting out of bed in the morning or when you stand after sitting, you should have your blood pressure checked while seated and then while standing to see if it falls from one position to the next. Because blood pressure varies throughout the day, your  doctor will rarely diagnose hypertension on the basis of a single reading. Instead, he or she will want to confirm the measurements on at least two occasions, usually within a few weeks of one another. The exception to this rule is if you have a blood pressure reading of 180/110 mm Hg or higher. A result this high usually calls for prompt treatment. It's also a good idea to have your blood pressure measured in both arms at least once, since the reading in one arm (usually the right) may be higher than that in the left. A 2014 study in The American Journal of Medicine of nearly 3,400 people found average arm- to-arm differences in systolic blood pressure of about 5 points. The higher number should be used to make treatment decisions. In 2017, new guidelines from  the American Heart Association, the [employer] of Cardiology, and nine other health organizations lowered the diagnosis of high blood pressure to 130/80 mm Hg or higher for all adults. The guidelines also redefined the various blood pressure categories to now include normal, elevated, Stage 1 hypertension, Stage 2 hypertension, and hypertensive crisis (see Blood pressure categories). Blood pressure categories  Blood pressure category SYSTOLIC (upper number)  DIASTOLIC (lower number)  Normal Less than 120 mm Hg and Less than 80 mm Hg  Elevated 120-129 mm Hg and Less than 80 mm Hg  High blood pressure: Stage 1 hypertension 130-139 mm Hg or 80-89 mm Hg  High blood pressure: Stage 2 hypertension 140 mm Hg or higher or 90 mm Hg or higher  Hypertensive crisis (consult your doctor immediately) Higher than 180 mm Hg and/or Higher than 120 mm Hg  Source: American Heart Association and American Stroke Association. For more on getting your blood pressure under control, buy Controlling Your Blood Pressure, a Special Health Report from [facility].

## 2024-07-28 NOTE — Progress Notes (Signed)
 Cardiology Office Note:    Date:  07/28/2024   ID:  [name], DOB November 17, 1956, MRN [id number]  PCP:  Thedora Garnette HERO, MD  Cardiologist:  Redell Leiter, MD   Referring MD: Thedora Garnette HERO, MD  ASSESSMENT:    1. Coronary artery calcification seen on CT scan   2. Mixed hyperlipidemia   3. Essential hypertension    PLAN:    In order of problems listed above:  The utility of the test is to confirm that she requires effective lipid-lowering therapy she will not achieve target with Zetia  alone she is hesitant to take another statin start bempedoic acid  goal LDL less than 70 if tolerated 3 days a week go to daily to achieve target With localization of her coronary calcification the left anterior descending coronary artery and with chest pain complaints cardiac CTA Vascular screen Currently not on antihypertensive agents I asked her to purchase a validated Om[employer] device gave her education and instructions and validated technique she will record and bring to her PCP for follow-up  Next appointment 3 months, if studies are unremarkable I will make further follow-up as needed   Medication Adjustments/Labs and Tests Ordered: Current medicines are reviewed at length with the patient today.  Concerns regarding medicines are outlined above.  Orders Placed This Encounter  Procedures   CT CORONARY MORPH W/CTA COR W/SCORE W/CA W/CM &/OR WO/CM   Basic Metabolic Panel (BMET)   EKG 12-Lead   VAS US  VASCUSCREEN   Meds ordered this encounter  Medications   Bempedoic Acid  180 MG TABS    Sig: Take 1 tablet (180 mg total) by mouth every Monday, Wednesday, and Friday.    Dispense:  40 tablet    Refill:  3   metoprolol  tartrate (LOPRESSOR ) 25 MG tablet    Sig: Take 1 tablet (25 mg total) by mouth once for 1 dose. Please take this medication 2 hours before CT    Dispense:  1 tablet    Refill:  0     I am concerned he may have heart disease  History of Present Illness:    [name] [name] is a  68 y.o. female with a history of hypertension and hyperlipidemia who is being seen today for the evaluation of coronary artery calcification CT scan at the request of Rudd, Stephen M, MD.  A coronary calcium score performed on the 27th with a score of 12/24/1986 87th percentile age sex and ethnicity Recent lipid profile cholesterol 188 LDL 123 non-HDL cholesterol elevated 140 H  Although I cannot find documentation in the chart she tells me she was intolerant to both lovastatin  and simvastatin  with muscle symptoms she is also extremely reluctant to consider taking a statin again We reviewed the utility of a coronary calcium score indicating she would benefit from lipid-lowering therapy her score is in the high range but not in the range typically associated with severe obstructive CAD greater than 300-400 however her coronary calcification is  localized predominantly to the left anterior descending coronary artery which always gathers my attention.  She also has having soreness and discomfort in the left chest and left breast she is concerned she may have unrecognized heart and carotid disease with pain in the right neck history of smoking and a father with a history of carotid endarterectomy.  I gave her a goal LDL less than 70 ideally less than 60 told her she should be taking a second agent either another statin or bempedoic acid  along with  Zetia  and she agrees to try bempedoic acid .  Other options would include the injectable PCSK9 inhibitor.  To address her concerns about other vascular disease and with a history of cigarette smoking we will do a vascular screen that will look at her carotids abdominal aorta and ABIs for peripheral arterial disease  She shows me home blood pressures a great deal of variability she has a normal cuff she also is using a wrist cuff however the numbers are mostly in the 120-140/70-80 range I asked her to purchase a new validated Omron device educated and gave her  instructions for good technique to limit variability in her blood pressure measurements currently not on antihypertensive therapy  Past Medical History:  Diagnosis Date   Adjustment disorder with mixed anxiety and depressed mood 07/04/2015   Allergic rhinitis 07/04/2015   Anxiety    Arthritis    HANDS,  KNEES   Bone tumor (benign)    RIGHT FEMUR   Borderline hypertension    Cervical radiculopathy at C5 02/25/2024   Coronary artery calcification seen on CT scan 07/20/2024   COVID-19 12/04/2023   Degeneration of spine 04/24/2021   Eczema 11/25/2023   Essential hypertension 03/24/2015   Female pelvic pain    Fibromyalgia    Hyperlipidemia 12/25/2021   Left knee injury    TENDONDINITIS/  CHONDRAMALIA   Neuroforaminal stenosis of lumbar spine 02/26/2023   Osteoporosis 09/02/2019   PONV (postoperative nausea and vomiting)    Scoliosis deformity of spine 04/24/2021   Spinal stenosis at L4-L5 level 02/26/2023   Spinal stenosis in cervical region 06/25/2024   Statin intolerance 07/20/2024   Subcutaneous nodule of neck 02/25/2024   Tobacco abuse 09/01/2012    Past Surgical History:  Procedure Laterality Date   DILATION AND CURETTAGE OF UTERUS  1986  &  1988   RETAINED PLACENTA   jaw tumor Right 2020   right lower jaw tumor removed 2020 by Dr. Dorthula   LAPAROSCOPIC ASSISTED VAGINAL HYSTERECTOMY  01-25-2003   LAPAROSCOPY N/A 04/18/2014   Procedure: LAPAROSCOPY DIAGNOSTIC;  Surgeon: Charlie CHRISTELLA Croak, MD;  Location: [facility];  Service: Gynecology;  Laterality: N/A;   NASAL SEPTUM SURGERY  AGE 68   OPEN REDUCTION INTERNAL FIXATION (ORIF) DISTAL RADIAL FRACTURE Left 09/18/2020   Procedure: OPEN TREATMENT OF LEFT DISTAL RADIUS FRACTURE;  Surgeon: Sebastian Lenis, MD;  Location: [facility];  Service: Orthopedics;  Laterality: Left;  LENGTH OF SURGERY: 75 MIN  PRE OP BLOCK   SALPINGOOPHORECTOMY Bilateral 04/18/2014   Procedure: SALPINGO OOPHORECTOMY;   Surgeon: Charlie CHRISTELLA Croak, MD;  Location[facility];  Service: Gynecology;  Laterality: Bilateral;   TONSILLECTOMY  AGE 66   TUBAL LIGATION      Current Medications: Current Meds  Medication Sig   Bempedoic Acid  180 MG TABS Take 1 tablet (180 mg total) by mouth every Monday, Wednesday, and Friday.   Cholecalciferol (VITAMIN D3) 2000 UNITS TABS Take 1 capsule by mouth daily.   ezetimibe  (ZETIA ) 10 MG tablet Take 1 tablet (10 mg total) by mouth daily.   fluticasone  (FLONASE ) 50 MCG/ACT nasal spray Place 1 spray into both nostrils daily.   Magnesium 250 MG TABS Take 1 tablet by mouth daily.   metoprolol  tartrate (LOPRESSOR ) 25 MG tablet Take 1 tablet (25 mg total) by mouth once for 1 dose. Please take this medication 2 hours before CT   Omega-3 Fatty Acids (OMEGA 3 FISH OIL PO) Take by mouth daily.  Allergies:   Hydrocodone , Demerol [meperidine], Molds & smuts, and Sulfa antibiotics   Social History   Socioeconomic History   Marital status: Married    Spouse name: Not on file   Number of children: 2   Years of education: Not on file   Highest education level: High school graduate  Occupational History   Occupation: Retired  Tobacco Use   Smoking status: Every Day    Current packs/day: 0.25    Average packs/day: 0.3 packs/day for 46.6 years (11.7 ttl pk-yrs)    Types: Cigarettes    Start date: 1979   Smokeless tobacco: Never   Tobacco comments:    Currently smokes about 1 cigarette a day.  Vaping Use   Vaping status: Never Used  Substance and Sexual Activity   Alcohol use: Not Currently    Comment: rare   Drug use: Yes    Types: Codeine    Sexual activity: Yes  Other Topics Concern   Not on file  Social History Narrative   Not on file   Social Drivers of Health   Financial Resource Strain: Low Risk  (03/22/2024)   Overall Financial Resource Strain (CARDIA)    Difficulty of Paying Living Expenses: Not hard at all  Food Insecurity: No Food  Insecurity (03/22/2024)   Hunger Vital Sign    Worried About Running Out of Food in the Last Year: Never true    Ran Out of Food in the Last Year: Never true  Transportation Needs: No Transportation Needs (03/22/2024)   PRAPARE - Administrator, Civil Service (Medical): No    Lack of Transportation (Non-Medical): No  Physical Activity: Sufficiently Active (03/22/2024)   Exercise Vital Sign    Days of Exercise per Week: 7 days    Minutes of Exercise per Session: 30 min  Stress: No Stress Concern Present (03/22/2024)   [employer] of Occupational Health - Occupational Stress Questionnaire    Feeling of Stress : Not at all  Social Connections: Moderately Integrated (03/22/2024)   Social Connection and Isolation Panel    Frequency of Communication with Friends and Family: More than three times a week    Frequency of Social Gatherings with Friends and Family: More than three times a week    Attends Religious Services: More than 4 times per year    Active Member of [employer] or Organizations: No    Attends Banker Meetings: Never    Marital Status: Married     Family History: The patient's family history includes Cancer in her father; Diabetes in her mother, sister, and sister; Heart disease in her mother; Kidney disease in her sister. There is no history of Colon cancer or Stomach cancer.  ROS:   ROS Please see the history of present illness.     All other systems reviewed and are negative.  EKGs/Labs/Other Studies Reviewed:    EKG Interpretation Date/Time:  Wednesday July 28 2024 09:54:20 EDT Ventricular Rate:  59 PR Interval:  142 QRS Duration:  80 QT Interval:  404 QTC Calculation: 399 R Axis:   60  Text Interpretation: Sinus bradycardia with 1 APC When compared with ECG of 25-Jan-2018 02:04, No significant changes Confirmed by Monetta Rogue ([id number]) on 07/28/2024 10:01:03 AM    Cardiac Studies & Procedures    ______________________________________________________________________________________________          CT SCANS  CT CARDIAC SCORING (SELF PAY ONLY) 07/08/2024  Addendum 07/14/2024  1:34 AM ADDENDUM REPORT: 07/14/2024 01:31  EXAM: OVER-READ INTERPRETATION  CT CHEST  The following report is an over-read performed by radiologist Dr. Suzen Dials of [facility] on 07/14/2024. This over-read does not include interpretation of cardiac or coronary anatomy or pathology. The coronary calcium score/coronary CTA interpretation by the cardiologist is attached.  COMPARISON:  None.  FINDINGS: Cardiovascular: There are no significant extracardiac vascular findings.  Mediastinum/Nodes: There are no enlarged lymph nodes within the visualized mediastinum.  Lungs/Pleura: There is no pleural effusion. The visualized lungs appear clear.  Upper abdomen: No significant findings in the visualized upper abdomen.  Musculoskeletal/Chest wall: No chest wall mass or suspicious osseous findings within the visualized chest.  IMPRESSION: No significant extracardiac findings within the visualized chest.   Electronically Signed By: Suzen Dials [name].D. On: 07/14/2024 01:31  Narrative : CLINICAL DATA:  Cardiovascular Disease Risk stratification  EXAM:  Coronary Calcium Score  TECHNIQUE:  A gated, non-contrast computed tomography scan of the heart was  performed using 3mm slice thickness. Axial images were analyzed on a  dedicated workstation. Calcium scoring of the coronary arteries was  performed using the Agatston method.  FINDINGS:  Coronary Calcium Score:  Left main: 0  Left anterior descending artery: 159  Left circumflex artery: 23.8  Right coronary artery: 15.1  Total: 198  Pericardium: Normal.  Ascending Aorta: Normal caliber.  Pulmonary artery: Normal caliber  Non-cardiac: See separate report from [facility].  IMPRESSION:  Coronary calcium score of 198. This was 5 percentile for age-, race-,  and sex-matched controls.  RECOMMENDATIONS:  Coronary artery calcium (CAC) score is a strong predictor of  incident coronary heart disease (CHD) and provides predictive  information beyond traditional risk factors. CAC scoring is  reasonable to use in the decision to withhold, postpone, or initiate  statin therapy in intermediate-risk or selected borderline-risk  asymptomatic adults (age 28-75 years and LDL-C >=70 to <190 mg/dL)  who do not have diabetes or established atherosclerotic  cardiovascular disease (ASCVD).* In intermediate-risk (10-year ASCVD  risk >=7.5% to <20%) adults or selected borderline-risk (10-year  ASCVD risk >=5% to <7.5%) adults in whom a CAC score is measured for  the purpose of making a treatment decision the following  recommendations have been made:  If CAC=0, it is reasonable to withhold statin therapy and reassess  in 5 to 10 years, as long as higher risk conditions are absent  (diabetes mellitus, family history of premature CHD in first degree  relatives (males <55 years; females <65 years), cigarette smoking,  or LDL >=190 mg/dL).  If CAC is 1 to 99, it is reasonable to initiate statin therapy for  patients >=56 years of age.  If CAC is >=100 or >=75th percentile, it is reasonable to initiate  statin therapy at any age.  Cardiology referral should be considered for patients with CAC  scores >=400 or >=75th percentile.  *2018 AHA/ACC/AACVPR/AAPA/ABC/ACPM/ADA/AGS/APhA/ASPC/NLA/PCNA  Guideline on the Management of Blood Cholesterol: A Report of the  American College of Cardiology/American Heart Association Task Force  on Clinical Practice Guidelines. J Am Coll Cardiol.  2019;73(24):3168-3209.  Electronically Signed: By: Lamar Fitch [name].D. On: 07/12/2024 08:04      ______________________________________________________________________________________________     EKG Interpretation Date/Time:  Wednesday July 28 2024 09:54:20 EDT Ventricular Rate:  59 PR Interval:  142 QRS Duration:  80 QT Interval:  404 QTC Calculation: 399 R Axis:   60  Text Interpretation: Sinus bradycardia with 1 APC When compared with ECG of 25-Jan-2018 02:04, No significant changes Confirmed by Monetta Rogue ([id number]) on  07/28/2024 10:01:03 AM    Recent Labs: 10/15/2023: ALT 18; Magnesium 1.9 06/28/2024: BUN 15; Creatinine, Ser 0.82; Potassium 4.5; Sodium 141  Recent Lipid Panel    Component Value Date/Time   CHOL 188 06/28/2024 1003   TRIG 125.0 06/28/2024 1003   HDL 39.80 06/28/2024 1003   CHOLHDL 5 06/28/2024 1003   VLDL 25.0 06/28/2024 1003   LDLCALC 123 (H) 06/28/2024 1003   LDLDIRECT 136.0 07/08/2023 0951    Physical Exam:    VS:  BP (!) 138/52 (BP Location: Right Arm, Patient Position: Sitting)   Pulse (!) 59   Ht 5' 9 (1.753 [name])   Wt 168 lb 12.8 oz (76.6 kg)   SpO2 96%   BMI 24.93 kg/[name]     Wt Readings from Last 3 Encounters:  07/28/24 168 lb 12.8 oz (76.6 kg)  07/07/24 169 lb (76.7 kg)  06/25/24 170 lb 3.2 oz (77.2 kg)     GEN: She has no xanthoma or xanthelasma well nourished, well developed in no acute distress HEENT: Normal NECK: No JVD; No carotid bruits LYMPHATICS: No lymphadenopathy CARDIAC: RRR, no murmurs, rubs, gallops RESPIRATORY:  Clear to auscultation without rales, wheezing or rhonchi  ABDOMEN: Soft, non-tender, non-distended MUSCULOSKELETAL:  No edema; No deformity  SKIN: Warm and dry NEUROLOGIC:  Alert and oriented x 3 PSYCHIATRIC:  Normal affect     Signed, Redell Leiter, MD  07/28/2024 10:54 AM    [facility]

## 2024-07-29 ENCOUNTER — Ambulatory Visit: Payer: Self-pay | Admitting: Cardiology

## 2024-07-29 DIAGNOSIS — M542 Cervicalgia: Secondary | ICD-10-CM | POA: Diagnosis not present

## 2024-07-29 DIAGNOSIS — M5451 Vertebrogenic low back pain: Secondary | ICD-10-CM | POA: Diagnosis not present

## 2024-07-29 LAB — BASIC METABOLIC PANEL WITH GFR
BUN/Creatinine Ratio: 20 (ref 12–28)
BUN: 16 mg/dL (ref 8–27)
CO2: 22 mmol/L (ref 20–29)
Calcium: 10.2 mg/dL (ref 8.7–10.3)
Chloride: 103 mmol/L (ref 96–106)
Creatinine, Ser: 0.81 mg/dL (ref 0.57–1.00)
Glucose: 101 mg/dL — ABNORMAL HIGH (ref 70–99)
Potassium: 5.2 mmol/L (ref 3.5–5.2)
Sodium: 140 mmol/L (ref 134–144)
eGFR: 80 mL/min/1.73 (ref >59)

## 2024-08-04 ENCOUNTER — Ambulatory Visit (INDEPENDENT_AMBULATORY_CARE_PROVIDER_SITE_OTHER): Admitting: Family Medicine

## 2024-08-04 ENCOUNTER — Telehealth: Payer: Self-pay | Admitting: Cardiology

## 2024-08-04 ENCOUNTER — Encounter: Payer: Self-pay | Admitting: Family Medicine

## 2024-08-04 VITALS — BP 132/70 | HR 63 | Temp 97.5°F | Ht 69.0 in | Wt 170.6 lb

## 2024-08-04 DIAGNOSIS — E782 Mixed hyperlipidemia: Secondary | ICD-10-CM | POA: Diagnosis not present

## 2024-08-04 DIAGNOSIS — Z720 Tobacco use: Secondary | ICD-10-CM | POA: Diagnosis not present

## 2024-08-04 DIAGNOSIS — I2510 Atherosclerotic heart disease of native coronary artery without angina pectoris: Secondary | ICD-10-CM | POA: Diagnosis not present

## 2024-08-04 DIAGNOSIS — I10 Essential (primary) hypertension: Secondary | ICD-10-CM | POA: Diagnosis not present

## 2024-08-04 NOTE — Assessment & Plan Note (Signed)
 Engaged with cardiology. Dr. Monetta has scheduled a CTA with coronary morphology. She is finding bempedoic acid  unaffordable. Follow-up with cardiology.

## 2024-08-04 NOTE — Progress Notes (Signed)
 [facility] [address] [place] [address] Dept: [phone] Dept Fax: [phone]  Office Visit  Subjective:    Patient ID: [name] [name] Server, female    DOB: 06/23/1956, 68 y.o..   MRN: [id number]  Chief Complaint  Patient presents with   Hypertension    4 week f/u HTN.   Home BP averaging 124/70- 130/80, 153/83   History of Present Illness:  Patient is in today for follow-up of her hypertension. She was seen on 7/23 with sign sof cervical radiculopathy. At the time, her blood pressure was quite high (160/70).  Ms. Nedra recommended she monitor this at home. She saw Dr. Monetta (cardiology) on 8/13. Her blood pressure was better that day. He has reinforced her monitoring this for now.  Ms. Antwi forgot to bring her BP log in today. She reports recent values of 124-153/70-83. She continues to smoke 3-4 cigarettes a day.   Ms. Grace has a history of hyperlipidemia. She is currently managed on Zetia  10 mg daily. I had referred her to cardiology regarding an elevated CAC score, with calcifications esp. in the LAD. Dr. Monetta plans further testing to evaluate her coronary artery flow. In the meantime, he had prescribed bempedoic acid . Ms. Deutschman notes her co-pay for a 90-day supply was going to be $220. She did not feels he could afford this. She did get in touch with the pharmacist and nurse at Dr. Leandrew office. They are checking with him regarding other alternatives.  Past Medical History: Patient Active Problem List   Diagnosis Date Noted   PONV (postoperative nausea and vomiting)    Left knee injury    Female pelvic pain    Borderline hypertension    Arthritis    Statin intolerance 07/20/2024   Coronary artery calcification seen on CT scan 07/20/2024   Spinal stenosis in cervical region 06/25/2024   Subcutaneous nodule of neck 02/25/2024   Cervical radiculopathy at C5 02/25/2024   COVID-19 12/04/2023   Eczema  11/25/2023   Spinal stenosis at L4-L5 level 02/26/2023   Neuroforaminal stenosis of lumbar spine 02/26/2023   Hyperlipidemia 12/25/2021   Scoliosis deformity of spine 04/24/2021   Degeneration of spine 04/24/2021   Anxiety 04/24/2021   Osteoporosis 09/02/2019   Adjustment disorder with mixed anxiety and depressed mood 07/04/2015   Allergic rhinitis 07/04/2015   Essential hypertension 03/24/2015   Tobacco abuse 09/01/2012   Fibromyalgia    Bone tumor (benign) 04/07/2012   Past Surgical History:  Procedure Laterality Date   DILATION AND CURETTAGE OF UTERUS  1986  &  1988   RETAINED PLACENTA   jaw tumor Right 2020   right lower jaw tumor removed 2020 by Dr. Dorthula   LAPAROSCOPIC ASSISTED VAGINAL HYSTERECTOMY  01-25-2003   LAPAROSCOPY N/A 04/18/2014   Procedure: LAPAROSCOPY DIAGNOSTIC;  Surgeon: Charlie CHRISTELLA Croak, MD;  Location: [facility];  Service: Gynecology;  Laterality: N/A;   NASAL SEPTUM SURGERY  AGE 64   OPEN REDUCTION INTERNAL FIXATION (ORIF) DISTAL RADIAL FRACTURE Left 09/18/2020   Procedure: OPEN TREATMENT OF LEFT DISTAL RADIUS FRACTURE;  Surgeon: Sebastian Lenis, MD;  Location: [facility];  Service: Orthopedics;  Laterality: Left;  LENGTH OF SURGERY: 75 MIN  PRE OP BLOCK   SALPINGOOPHORECTOMY Bilateral 04/18/2014   Procedure: SALPINGO OOPHORECTOMY;  Surgeon: Charlie CHRISTELLA Croak, MD;  [facility]: [facility];  Service: Gynecology;  Laterality: Bilateral;   TONSILLECTOMY  AGE 61   TUBAL LIGATION  Family History  Problem Relation Age of Onset   Diabetes Mother    Heart disease Mother    Cancer Father        Lung   Kidney disease Sister    Diabetes Sister    Diabetes Sister    Colon cancer Neg Hx    Stomach cancer Neg Hx    Outpatient Medications Prior to Visit  Medication Sig Dispense Refill   Cholecalciferol (VITAMIN D3) 2000 UNITS TABS Take 1 capsule by mouth daily.     ezetimibe  (ZETIA ) 10 MG tablet Take 1 tablet (10 mg  total) by mouth daily. 90 tablet 3   fluticasone  (FLONASE ) 50 MCG/ACT nasal spray Place 1 spray into both nostrils daily.     Magnesium 250 MG TABS Take 1 tablet by mouth daily.     metoprolol  tartrate (LOPRESSOR ) 25 MG tablet Take 1 tablet (25 mg total) by mouth once for 1 dose. Please take this medication 2 hours before CT 1 tablet 0   Omega-3 Fatty Acids (OMEGA 3 FISH OIL PO) Take by mouth daily.     vitamin E 180 MG (400 UNITS) capsule Take 400 Units by mouth daily.     Bempedoic Acid  180 MG TABS Take 1 tablet (180 mg total) by mouth every Monday, Wednesday, and Friday. (Patient not taking: Reported on 08/04/2024) 40 tablet 3   No facility-administered medications prior to visit.   Allergies  Allergen Reactions   Hydrocodone  Rash   Demerol [Meperidine] Nausea And Vomiting    severe   Molds & Smuts    Sulfa Antibiotics Swelling and Rash     Objective:   Today's Vitals   08/04/24 1023  BP: 132/70  Pulse: 63  Temp: (!) 97.5 F (36.4 C)  TempSrc: Temporal  SpO2: 97%  Weight: 170 lb 9.6 oz (77.4 kg)  Height: 5' 9 (1.753 m)   Body mass index is 25.19 kg/m.   General: Well developed, well nourished. No acute distress. Psych: Alert and oriented. Normal mood and affect.  Health Maintenance Due  Topic Date Due   Pneumococcal Vaccine: 50+ Years (1 of 2 - PCV) Never done   Zoster Vaccines- Shingrix (1 of 2) Never done   Colonoscopy  10/14/2022     Assessment & Plan:   Problem List Items Addressed This Visit       Cardiovascular and Mediastinum   Coronary artery calcification seen on CT scan   Engaged with cardiology. Dr. Monetta has scheduled a CTA with coronary morphology. She is finding bempedoic acid  unaffordable. Follow-up with cardiology.      Essential hypertension - Primary   BP has a borderline high systolic with a normal diastolic pressure. Her reported home BPs are doing okay. We will continue to monitor this for now.        Other   Hyperlipidemia   As  above. Continue ezetimibe  10 mg daily. Follow up with cardiology regarding other therapeutic options.      Tobacco abuse   Continue to advise that she stop smoking. Discussed where she might access 7 mg/24 hr nicotine  patches.  I spent 3 minutes counseling the patient about tobacco cessation.        Return in about 3 months (around 11/04/2024) for Reassessment.   Garnette CHRISTELLA Simpler, MD

## 2024-08-04 NOTE — Assessment & Plan Note (Signed)
 As above. Continue ezetimibe  10 mg daily. Follow up with cardiology regarding other therapeutic options.

## 2024-08-04 NOTE — Telephone Encounter (Signed)
 Pt c/o medication issue:  1. Name of Medication:   Bempedoic Acid  180 MG TABS    2. How are you currently taking this medication (dosage and times per day)?    3. Are you having a reaction (difficulty breathing--STAT)? no  4. What is your medication issue? Patient states the medication is too expensive. Please advise

## 2024-08-04 NOTE — Assessment & Plan Note (Signed)
 BP has a borderline high systolic with a normal diastolic pressure. Her reported home BPs are doing okay. We will continue to monitor this for now.

## 2024-08-04 NOTE — Assessment & Plan Note (Signed)
 Continue to advise that she stop smoking. Discussed where she might access 7 mg/24 hr nicotine  patches.  I spent 3 minutes counseling the patient about tobacco cessation.

## 2024-08-05 ENCOUNTER — Other Ambulatory Visit (HOSPITAL_COMMUNITY): Payer: Self-pay

## 2024-08-05 ENCOUNTER — Telehealth: Payer: Self-pay

## 2024-08-05 ENCOUNTER — Other Ambulatory Visit: Payer: Self-pay

## 2024-08-05 NOTE — Telephone Encounter (Signed)
 Enrolled in [employer] grant, info and instructions have been faxed to [facility] pharmacy.

## 2024-08-05 NOTE — Telephone Encounter (Signed)
 Patient Advocate Encounter   The patient was approved for a [employer] grant that will help cover the cost of NEXLETOL  Total amount awarded, $2,500.  Effective: 07/06/24 - 07/05/25   APW:[id number] ERW:[id number] Hmnle:[id number] PI:[id number]   Pharmacy provided with approval and processing information.   Ileana Lehmann, CPhT  Pharmacy Patient Advocate Specialist  Direct Number: [phone] Fax: [phone]

## 2024-08-05 NOTE — Telephone Encounter (Signed)
 Called the patient and informed her of the message from the prescription assistance team below:  Enrolled in [employer] grant, info and instructions have been faxed to [facility] pharmacy.  Patient verbalized understanding and had no further questions at this time.

## 2024-08-10 DIAGNOSIS — M5451 Vertebrogenic low back pain: Secondary | ICD-10-CM | POA: Diagnosis not present

## 2024-08-10 DIAGNOSIS — M542 Cervicalgia: Secondary | ICD-10-CM | POA: Diagnosis not present

## 2024-08-13 ENCOUNTER — Encounter (HOSPITAL_COMMUNITY): Payer: Self-pay

## 2024-08-17 ENCOUNTER — Ambulatory Visit (HOSPITAL_BASED_OUTPATIENT_CLINIC_OR_DEPARTMENT_OTHER)
Admission: RE | Admit: 2024-08-17 | Discharge: 2024-08-17 | Disposition: A | Discharge status: Home / Self Care | Source: Ambulatory Visit | Attending: Cardiology | Admitting: Cardiology

## 2024-08-17 DIAGNOSIS — E782 Mixed hyperlipidemia: Secondary | ICD-10-CM | POA: Insufficient documentation

## 2024-08-17 DIAGNOSIS — I10 Essential (primary) hypertension: Secondary | ICD-10-CM | POA: Insufficient documentation

## 2024-08-17 DIAGNOSIS — R931 Abnormal findings on diagnostic imaging of heart and coronary circulation: Secondary | ICD-10-CM | POA: Insufficient documentation

## 2024-08-17 DIAGNOSIS — I2510 Atherosclerotic heart disease of native coronary artery without angina pectoris: Secondary | ICD-10-CM | POA: Diagnosis not present

## 2024-08-17 MED ORDER — IOHEXOL 350 MG/ML SOLN
100.0000 mL | Freq: Once | INTRAVENOUS | Status: AC | PRN
  Administered 2024-08-17: 95 mL via INTRAVENOUS

## 2024-08-17 MED ORDER — NITROGLYCERIN 0.4 MG SL SUBL
0.8000 mg | SUBLINGUAL_TABLET | Freq: Once | SUBLINGUAL | Status: AC
  Administered 2024-08-17: 0.8 mg via SUBLINGUAL

## 2024-08-18 ENCOUNTER — Other Ambulatory Visit: Payer: Self-pay | Admitting: Cardiology

## 2024-08-18 ENCOUNTER — Ambulatory Visit (HOSPITAL_BASED_OUTPATIENT_CLINIC_OR_DEPARTMENT_OTHER)
Admission: RE | Admit: 2024-08-18 | Discharge: 2024-08-18 | Disposition: A | Discharge status: Home / Self Care | Source: Ambulatory Visit | Attending: Cardiology | Admitting: Cardiology

## 2024-08-18 DIAGNOSIS — R931 Abnormal findings on diagnostic imaging of heart and coronary circulation: Secondary | ICD-10-CM | POA: Diagnosis not present

## 2024-08-18 NOTE — Progress Notes (Signed)
 FFR Order

## 2024-08-19 ENCOUNTER — Ambulatory Visit: Payer: Self-pay | Admitting: Cardiology

## 2024-08-19 ENCOUNTER — Other Ambulatory Visit: Payer: Self-pay

## 2024-08-19 DIAGNOSIS — M5451 Vertebrogenic low back pain: Secondary | ICD-10-CM | POA: Diagnosis not present

## 2024-08-19 DIAGNOSIS — M542 Cervicalgia: Secondary | ICD-10-CM | POA: Diagnosis not present

## 2024-08-19 MED ORDER — NITROGLYCERIN 0.4 MG SL SUBL: 0.4000 mg | SUBLINGUAL_TABLET | SUBLINGUAL | 1 refills | Status: AC | PRN

## 2024-08-22 ENCOUNTER — Other Ambulatory Visit: Payer: Self-pay | Admitting: Family Medicine

## 2024-08-22 DIAGNOSIS — E785 Hyperlipidemia, unspecified: Secondary | ICD-10-CM

## 2024-08-24 ENCOUNTER — Encounter: Payer: Self-pay | Admitting: Cardiology

## 2024-08-24 ENCOUNTER — Ambulatory Visit: Attending: Cardiology

## 2024-08-24 DIAGNOSIS — E782 Mixed hyperlipidemia: Secondary | ICD-10-CM

## 2024-08-24 DIAGNOSIS — I10 Essential (primary) hypertension: Secondary | ICD-10-CM

## 2024-08-24 DIAGNOSIS — I2510 Atherosclerotic heart disease of native coronary artery without angina pectoris: Secondary | ICD-10-CM

## 2024-08-26 ENCOUNTER — Telehealth: Payer: Self-pay | Admitting: Cardiology

## 2024-08-26 ENCOUNTER — Encounter (HOSPITAL_COMMUNITY): Payer: Self-pay | Admitting: Cardiology

## 2024-08-26 ENCOUNTER — Other Ambulatory Visit: Payer: Self-pay

## 2024-08-26 ENCOUNTER — Telehealth: Payer: Self-pay

## 2024-08-26 DIAGNOSIS — M542 Cervicalgia: Secondary | ICD-10-CM | POA: Diagnosis not present

## 2024-08-26 DIAGNOSIS — M5451 Vertebrogenic low back pain: Secondary | ICD-10-CM | POA: Diagnosis not present

## 2024-08-26 NOTE — Telephone Encounter (Signed)
 Patient returned our call and she was informed of Dr. Leandrew recommendation below:   Lets go a different direction stop Zetia  start Repatha 140 twice daily and I would wait until after 4 doses before rechecking the labs within April B.   Patient was very hesitant regarding starting Repatha. After some time on the phone with the patient explaining about Repatha she stated that she wanted to research the medication over the weekend and she would get back to us  early next week. She was also concerned about the cost of the medication so I explained that I would send a message to our prescription assistance team to see if there was any grants or assistance she was eligible for to help her be able to afford this medication. Patient verbalized understanding and was thankful for the help. Patient had no further questions at this time.

## 2024-08-26 NOTE — Telephone Encounter (Signed)
 Pt returning call about test results. Please advise.

## 2024-08-26 NOTE — Telephone Encounter (Signed)
 This patient was asking if there were any grants or patient assistance  that she was eligible for to help her be able to afford her Repatha. She is currently researching the medication side effects but also wanted information on the cost. Thanks

## 2024-08-27 ENCOUNTER — Telehealth: Payer: Self-pay | Admitting: Pharmacy Technician

## 2024-08-27 ENCOUNTER — Other Ambulatory Visit (HOSPITAL_COMMUNITY): Payer: Self-pay

## 2024-08-27 NOTE — Telephone Encounter (Signed)
 Per pt calls      Repatha needs a prior auth. Pa approved. I lmom for patient to let her know the hw grant for nexletol  will work for repatha and seeing where she wants the grant. She got the nexletol  at [facility] last   Pharmacy Patient Advocate Encounter   Received notification from Pt Calls Messages that prior authorization for REPATHA is required/requested.   Insurance verification completed.   The patient is insured through [facility] ADVANTAGE/RX ADVANCE .   Per test claim: PA required; PA submitted to above mentioned insurance via Latent Key/confirmation #/EOC [id number] Status is pending   Pharmacy Patient Advocate Encounter  Received notification from [facility] ADVANTAGE/RX ADVANCE that Prior Authorization for repatha has been APPROVED from 08/27/24 to 02/23/25  Per test claim copay 47.00  PA #/Case ID/Reference #: [id number]

## 2024-08-27 NOTE — Telephone Encounter (Signed)
 Hi, the patient has some questions about the repatha and nexletol  and zetia . She has only been on nexletol  3 weeks. She also my need to talk to a pharmacist about the possible side effects of repatha after talking to you about the medication change

## 2024-08-27 NOTE — Telephone Encounter (Signed)
 Patient called back and she said she is still thinking about repatha and she is aware the grant is at [facility] and If she wants it somewhere else we can send that grant there

## 2024-08-30 NOTE — Telephone Encounter (Signed)
 Called the patient and she had multiple concerns regarding her testing and questions regarding her medications. Patient requested an appointment with Dr. Monetta to answer her questions. An appointment was scheduled for the patient to see Dr. Monetta on 09/10/24 at 3:40 pm. Patient verbalized understanding and had no further questions at this time.

## 2024-09-02 DIAGNOSIS — M542 Cervicalgia: Secondary | ICD-10-CM | POA: Diagnosis not present

## 2024-09-02 DIAGNOSIS — M5451 Vertebrogenic low back pain: Secondary | ICD-10-CM | POA: Diagnosis not present

## 2024-09-02 DIAGNOSIS — R030 Elevated blood-pressure reading, without diagnosis of hypertension: Secondary | ICD-10-CM | POA: Diagnosis not present

## 2024-09-06 ENCOUNTER — Telehealth: Payer: Self-pay

## 2024-09-06 NOTE — Telephone Encounter (Signed)
 Called patient in regarding her elevated BP on Thursday.  She did go and get checked out at Urgent Care in [place].  211/? BP.    They prescribed her Lisinopril  5 mg.  She is schedule to see Cardiology on 09/10/24.  If they don't want to give a refill she will call back to schedule.  Dm/cma

## 2024-09-09 ENCOUNTER — Other Ambulatory Visit: Payer: Self-pay

## 2024-09-09 NOTE — Progress Notes (Unsigned)
 Cardiology Office Note:    Date:  09/10/2024   ID:  [name] [name], DOB January 01, 1956, MRN [id number]  PCP:  Thedora Garnette HERO, MD  Cardiologist:  Redell Leiter, MD    Referring MD: Thedora Garnette HERO, MD    ASSESSMENT:    1. Coronary artery disease of native artery of native heart with stable angina pectoris   2. Essential hypertension   3. Mixed hyperlipidemia   4. Hyperlipidemia, unspecified hyperlipidemia type    PLAN:    In order of problems listed above:  Stable she has moderate stenosis not flow-limiting but would benefit from medical therapy including aspirin  and lipid-lowering combined low intensity statin and Zetia .  If intolerant Repatha is appropriate Instructed to take aspirin  we will call make sure she is taking if not start 81 mg/day She has as needed nitroglycerin  Continue her current antihypertensive She plans to follow-up with her PCP I told her she can either see me in a year in follow-up or I will see her back in the future as needed   Next appointment: To be decided   Medication Adjustments/Labs and Tests Ordered: Current medicines are reviewed at length with the patient today.  Concerns regarding medicines are outlined above.  No orders of the defined types were placed in this encounter.  Meds ordered this encounter  Medications   pravastatin  (PRAVACHOL ) 20 MG tablet    Sig: Take 1 tablet (20 mg total) by mouth 2 (two) times a week.    Dispense:  45 tablet    Refill:  3   ezetimibe  (ZETIA ) 10 MG tablet    Sig: Take 1 tablet (10 mg total) by mouth daily.    Dispense:  90 tablet    Refill:  3     History of Present Illness:    [name] [name] is a 68 y.o. female with a hx of coronary calcification seen on CT scan hyperlipidemia hypertension coronary calcium score 197/87th percentile and coronary artery disease on cardiac CTA moderate stenosis 50 to 69% mid left circumflex coronary artery last seen 07/28/2024.  Compliance with diet, lifestyle and  medications: Yes  She has multiple concerns she has been to go over the results of her CTA with her and we reviewed her vascular screen  She thinks she is intolerant of bempedoic acid  that is causing joint pain and muscle discomfort.  We are to discontinue wait 2 weeks go back on her previous study and add a minimum dose of the low intensity statin 2 days a week that can be quite effective in combination. She goes to physical therapy when she does her blood pressure is high it interferes with that she is back on lisinopril  and her home blood pressures are actually quite good less than 130-140 systolic with a validated device. Past Medical History:  Diagnosis Date   Adjustment disorder with mixed anxiety and depressed mood 07/04/2015   Allergic rhinitis 07/04/2015   Anxiety    Arthritis    HANDS,  KNEES   Bone tumor (benign)    RIGHT FEMUR   Borderline hypertension    Cervical radiculopathy at C5 02/25/2024   Coronary artery calcification seen on CT scan 07/20/2024   COVID-19 12/04/2023   Degeneration of spine 04/24/2021   Eczema 11/25/2023   Essential hypertension 03/24/2015   Female pelvic pain    Fibromyalgia    Hyperlipidemia 12/25/2021   Left knee injury    TENDONDINITIS/  CHONDRAMALIA   Neuroforaminal stenosis of lumbar spine 02/26/2023  Osteoporosis 09/02/2019   PONV (postoperative nausea and vomiting)    Scoliosis deformity of spine 04/24/2021   Spinal stenosis at L4-L5 level 02/26/2023   Spinal stenosis in cervical region 06/25/2024   Statin intolerance 07/20/2024   Subcutaneous nodule of neck 02/25/2024   Tobacco abuse 09/01/2012    Current Medications: Current Meds  Medication Sig   Cholecalciferol (VITAMIN D3) 2000 UNITS TABS Take 1 capsule by mouth daily.   fluticasone  (FLONASE ) 50 MCG/ACT nasal spray Place 1 spray into both nostrils daily.   lisinopril  (ZESTRIL ) 5 MG tablet Take 5 mg by mouth daily.   Magnesium 250 MG TABS Take 1 tablet by mouth daily.    nitroGLYCERIN  (NITROSTAT ) 0.4 MG SL tablet Place 1 tablet (0.4 mg total) under the tongue every 5 (five) minutes as needed.   Omega-3 Fatty Acids (OMEGA 3 FISH OIL PO) Take by mouth daily.   [START ON 09/13/2024] pravastatin  (PRAVACHOL ) 20 MG tablet Take 1 tablet (20 mg total) by mouth 2 (two) times a week.   vitamin E 180 MG (400 UNITS) capsule Take 400 Units by mouth daily.   [DISCONTINUED] ezetimibe  (ZETIA ) 10 MG tablet TAKE 1 TABLET BY MOUTH EVERY DAY      EKGs/Labs/Other Studies Reviewed:    The following studies were reviewed today:  Cardiac Studies & Procedures   ______________________________________________________________________________________________          CT SCANS  CT CORONARY MORPH W/CTA COR W/SCORE 08/17/2024  Addendum 08/22/2024  8:55 AM ADDENDUM REPORT: 08/22/2024 08:53  EXAM: OVER-READ INTERPRETATION  CT CHEST  The following report is an over-read performed by radiologist Dr. Fonda Mom [facility] on 08/22/2024. This over-read does not include interpretation of cardiac or coronary anatomy or pathology. The coronary CTA interpretation by the cardiologist is attached.  COMPARISON:  07/08/2024.  FINDINGS: Cardiovascular:  See findings discussed in the body of the report.  Mediastinum/Nodes: No suspicious adenopathy identified. Imaged mediastinal structures are unremarkable.  Lungs/Pleura: There is dependent basilar subsegmental atelectasis. No pneumonia or pulmonary edema. No pleural effusion or pneumothorax.  Upper Abdomen: No acute abnormality.  Musculoskeletal: No chest wall abnormality. No acute osseous findings.  IMPRESSION: No acute extracardiac incidental findings.   Electronically Signed By: Fonda Field M.D. On: 08/22/2024 08:53  Narrative CLINICAL DATA:  CP  EXAM: Cardiac/Coronary  CTA  TECHNIQUE: The patient was scanned on a GE Apex scanner.  FINDINGS: A 120 kV prospective scan was triggered in the  descending thoracic aorta at 111 HU's. Axial non-contrast 3 mm slices were carried out through the heart. The data set was analyzed on a dedicated work station and scored using the Agatson method. Gantry rotation speed was 250 msecs and collimation was .6 mm. No beta blockade and 0.8 mg of sl NTG was given. The 3D data set was reconstructed at 75% of the R-R cycle. Diastolic phases were analyzed on a dedicated work station using MPR, MIP and VRT modes. The patient received 80 cc of contrast.  Aorta: Normal size.  No calcifications.  No dissection.  Aortic Valve:  Trileaflet.  No calcifications.  Coronary Arteries:  Normal coronary origin.  Right dominance.  RCA is a large dominant artery that gives rise to PDA and PLA. There is mixed plaque in the mid portion of this artery with mild stenosis of 25-49%.  Left main is a large artery that gives rise to LAD and LCX arteries.  LAD is a large vessel that has few mixed plaques in its proximal and mid portion  with mild stenosis of 25-49%. This artery gives rise to small D1, D2 and moderate size D3.  LCX is a non-dominant artery that gives rise to one large OM1 branch. There is mostly soft plaque in the mid portion of this artery with moderate stenosis of 50-69%.  Other findings:  Normal pulmonary vein drainage into the left atrium.  Normal left atrial appendage without a thrombus.  Normal size of the pulmonary artery.  IMPRESSION: 1. Coronary calcium score of 197. This was 71 percentile for age and sex matched control.  2. Normal coronary origin with right dominance.  3. CAD-RADS 3. Moderate stenosis. (50-69%) mid CX. Consider symptom-guided anti-ischemic pharmacotherapy as well as risk factor modification per guideline directed care. Additional analysis with CT FFR will be submitted.  4. Plaque analysis: TPV 246 mm3, 57th percentile, calcified plaque 35 mm3, non calcified plaque 211 mm3. Moderate TPV.  Electronically  Signed: By: Lamar Fitch M.D. On: 08/18/2024 17:20   CT SCANS  CT CARDIAC SCORING (SELF PAY ONLY) 07/08/2024  Addendum 07/14/2024  1:34 AM ADDENDUM REPORT: 07/14/2024 01:31  EXAM: OVER-READ INTERPRETATION  CT CHEST  The following report is an over-read performed by radiologist Dr. Suzen Dials of [facility] on 07/14/2024. This over-read does not include interpretation of cardiac or coronary anatomy or pathology. The coronary calcium score/coronary CTA interpretation by the cardiologist is attached.  COMPARISON:  None.  FINDINGS: Cardiovascular: There are no significant extracardiac vascular findings.  Mediastinum/Nodes: There are no enlarged lymph nodes within the visualized mediastinum.  Lungs/Pleura: There is no pleural effusion. The visualized lungs appear clear.  Upper abdomen: No significant findings in the visualized upper abdomen.  Musculoskeletal/Chest wall: No chest wall mass or suspicious osseous findings within the visualized chest.  IMPRESSION: No significant extracardiac findings within the visualized chest.   Electronically Signed By: Suzen Dials M.D. On: 07/14/2024 01:31  Narrative : CLINICAL DATA:  Cardiovascular Disease Risk stratification  EXAM:  Coronary Calcium Score  TECHNIQUE:  A gated, non-contrast computed tomography scan of the heart was  performed using 3mm slice thickness. Axial images were analyzed on a  dedicated workstation. Calcium scoring of the coronary arteries was  performed using the Agatston method.  FINDINGS:  Coronary Calcium Score:  Left main: 0  Left anterior descending artery: 159  Left circumflex artery: 23.8  Right coronary artery: 15.1  Total: 198  Pericardium: Normal.  Ascending Aorta: Normal caliber.  Pulmonary artery: Normal caliber  Non-cardiac: See separate report from [facility].  IMPRESSION:  Coronary calcium score of 198. This was 35  percentile for age-, race-,  and sex-matched controls.  RECOMMENDATIONS:  Coronary artery calcium (CAC) score is a strong predictor of  incident coronary heart disease (CHD) and provides predictive  information beyond traditional risk factors. CAC scoring is  reasonable to use in the decision to withhold, postpone, or initiate  statin therapy in intermediate-risk or selected borderline-risk  asymptomatic adults (age 1-75 years and LDL-C >=70 to <190 mg/dL)  who do not have diabetes or established atherosclerotic  cardiovascular disease (ASCVD).* In intermediate-risk (10-year ASCVD  risk >=7.5% to <20%) adults or selected borderline-risk (10-year  ASCVD risk >=5% to <7.5%) adults in whom a CAC score is measured for  the purpose of making a treatment decision the following  recommendations have been made:  If CAC=0, it is reasonable to withhold statin therapy and reassess  in 5 to 10 years, as long as higher risk conditions are absent  (diabetes mellitus, family history  of premature CHD in first degree  relatives (males <55 years; females <65 years), cigarette smoking,  or LDL >=190 mg/dL).  If CAC is 1 to 99, it is reasonable to initiate statin therapy for  patients >=35 years of age.  If CAC is >=100 or >=75th percentile, it is reasonable to initiate  statin therapy at any age.  Cardiology referral should be considered for patients with CAC  scores >=400 or >=75th percentile.  *2018 AHA/ACC/AACVPR/AAPA/ABC/ACPM/ADA/AGS/APhA/ASPC/NLA/PCNA  Guideline on the Management of Blood Cholesterol: A Report of the  American College of Cardiology/American Heart Association Task Force  on Clinical Practice Guidelines. J Am Coll Cardiol.  2019;73(24):3168-3209.  Electronically Signed: By: Lamar Fitch M.D. On: 07/12/2024 08:04     ______________________________________________________________________________________________          Recent  Labs: 10/15/2023: ALT 18; Magnesium 1.9 07/28/2024: BUN 16; Creatinine, Ser 0.81; Potassium 5.2; Sodium 140  Recent Lipid Panel    Component Value Date/Time   CHOL 188 06/28/2024 1003   TRIG 125.0 06/28/2024 1003   HDL 39.80 06/28/2024 1003   CHOLHDL 5 06/28/2024 1003   VLDL 25.0 06/28/2024 1003   LDLCALC 123 (H) 06/28/2024 1003   LDLDIRECT 136.0 07/08/2023 0951    Physical Exam:    VS:  BP (!) 160/62   Pulse 70   Ht 5' 8 (1.727 m)   Wt 170 lb 6.4 oz (77.3 kg)   SpO2 99%   BMI 25.91 kg/m     Wt Readings from Last 3 Encounters:  09/10/24 170 lb 6.4 oz (77.3 kg)  08/04/24 170 lb 9.6 oz (77.4 kg)  07/28/24 168 lb 12.8 oz (76.6 kg)     GEN:  Well nourished, well developed in no acute distress HEENT: Normal NECK: No JVD; No carotid bruits LYMPHATICS: No lymphadenopathy CARDIAC: RRR, no murmurs, rubs, gallops RESPIRATORY:  Clear to auscultation without rales, wheezing or rhonchi  ABDOMEN: Soft, non-tender, non-distended MUSCULOSKELETAL:  No edema; No deformity  SKIN: Warm and dry NEUROLOGIC:  Alert and oriented x 3 PSYCHIATRIC:  Normal affect    Signed, Redell Leiter, MD  09/10/2024 4:50 PM    [facility]

## 2024-09-10 ENCOUNTER — Other Ambulatory Visit: Payer: Self-pay

## 2024-09-10 ENCOUNTER — Ambulatory Visit: Attending: Cardiology | Admitting: Cardiology

## 2024-09-10 ENCOUNTER — Telehealth: Payer: Self-pay

## 2024-09-10 VITALS — BP 160/62 | HR 70 | Ht 68.0 in | Wt 170.4 lb

## 2024-09-10 DIAGNOSIS — I10 Essential (primary) hypertension: Secondary | ICD-10-CM | POA: Diagnosis not present

## 2024-09-10 DIAGNOSIS — I25118 Atherosclerotic heart disease of native coronary artery with other forms of angina pectoris: Secondary | ICD-10-CM | POA: Diagnosis not present

## 2024-09-10 DIAGNOSIS — E785 Hyperlipidemia, unspecified: Secondary | ICD-10-CM | POA: Diagnosis not present

## 2024-09-10 DIAGNOSIS — E782 Mixed hyperlipidemia: Secondary | ICD-10-CM | POA: Diagnosis not present

## 2024-09-10 MED ORDER — PRAVASTATIN SODIUM 20 MG PO TABS: 20.0000 mg | ORAL_TABLET | ORAL | 3 refills | Status: AC

## 2024-09-10 MED ORDER — ASPIRIN 81 MG PO TBEC: 81.0000 mg | DELAYED_RELEASE_TABLET | Freq: Every day | ORAL | Status: AC

## 2024-09-10 MED ORDER — EZETIMIBE 10 MG PO TABS: 10.0000 mg | ORAL_TABLET | Freq: Every day | ORAL | 3 refills | Status: AC

## 2024-09-10 NOTE — Telephone Encounter (Signed)
 Received the following message from Dr. Monetta below:  Please be sure she is taking 81 mg of aspirin  a day.  Called the patient and informed her of Dr. Leandrew  recommendation to start taking a baby Aspirin . Patient verbalized understanding and had no further questions at this time.

## 2024-09-10 NOTE — Patient Instructions (Signed)
 Medication Instructions:  Your physician has recommended you make the following change in your medication:   START: Pravastatin  20 mg two times weekly START: Zetia  10 mg daily  *If you need a refill on your cardiac medications before your next appointment, please call your pharmacy*  Lab Work: None If you have labs (blood work) drawn today and your tests are completely normal, you will receive your results only by: MyChart Message (if you have MyChart) OR A paper copy in the mail If you have any lab test that is abnormal or we need to change your treatment, we will call you to review the results.  Testing/Procedures: None  Follow-Up: At [facility], you and your health needs are our priority.  As part of our continuing mission to provide you with exceptional heart care, our providers are all part of one team.  This team includes your primary Cardiologist (physician) and Advanced Practice Providers or APPs (Physician Assistants and Nurse Practitioners) who all work together to provide you with the care you need, when you need it.  Your next appointment:   1 year(s)  Provider:   Redell Leiter, MD    We recommend signing up for the patient portal called MyChart.  Sign up information is provided on this After Visit Summary.  MyChart is used to connect with patients for Virtual Visits (Telemedicine).  Patients are able to view lab/test results, encounter notes, upcoming appointments, etc.  Non-urgent messages can be sent to your provider as well.   To learn more about what you can do with MyChart, go to [email or web address].   Other Instructions None

## 2024-09-24 ENCOUNTER — Ambulatory Visit: Payer: Self-pay

## 2024-09-24 NOTE — Telephone Encounter (Signed)
 FYI Only or Action Required?: Action required by provider: clinical question for provider.  Patient was last seen in primary care on 08/04/2024 by Thedora Garnette HERO, MD.  Called Nurse Triage reporting Advice Only.  Symptoms began several days ago.  Interventions attempted: OTC medications: flonase ; zyrtec.  Symptoms are: unchanged.  Triage Disposition: Call PCP When Office is Open  Patient/caregiver understands and will follow disposition?: Yes  Copied from CRM [id number]. Topic: Clinical - Red Word Triage >> Sep 24, 2024 11:42 AM Suzen RAMAN wrote: Red Word that prompted transfer to Nurse Triage: worsening productive cough, would like recommendation on OTC medication Reason for Disposition  [1] Caller requesting NON-URGENT health information AND [2] PCP's office is the best resource  Answer Assessment - Initial Assessment Questions Pt calls in and states her grandsons cologne set off her allergies. Sneeze cough, runny nose. Has been taking zyrtec, flonase , Mucinex  cough drops. Has gotten better, but the last 3-4 days feels nothing is helping as much. Only wants OTC recommendations from RN and PCP. Recent dx of CAD, RN explained most antihistimines are safe, but to stay away from decongestants/NSAIDS. Pt would also like recommendations from PCP.   1. REASON FOR CALL: What is the main reason for your call? or How can I best help you?     OTC recommendations 2. SYMPTOMS : Do you have any symptoms?      Cough; sneeze, runny nose 3. OTHER QUESTIONS: Do you have any other questions?     Denies  Protocols used: Information Only Call - No Triage-A-AH

## 2024-09-27 ENCOUNTER — Ambulatory Visit: Admitting: Family Medicine

## 2024-09-27 NOTE — Telephone Encounter (Signed)
 Called patient and she will continue taking Mucinex  DM and Flonase . To get to feeling better. She will call and schedule later if she needs too. Dm/cma

## 2024-09-27 NOTE — Telephone Encounter (Signed)
Left VM to rtn call. Dm/cma       

## 2024-09-27 NOTE — Telephone Encounter (Signed)
 Copied from CRM [id number]. Topic: Clinical - Red Word Triage >> Sep 24, 2024 11:42 AM Suzen RAMAN wrote: Red Word that prompted transfer to Nurse Triage: worsening productive cough, would like recommendation on OTC medication >> Sep 27, 2024  4:43 PM Drema MATSU wrote: Patient is returning a call from [place]. She ask that you call her landline [phone].  Pt would like to speak back with Karna

## 2024-09-29 DIAGNOSIS — M5451 Vertebrogenic low back pain: Secondary | ICD-10-CM | POA: Diagnosis not present

## 2024-09-29 DIAGNOSIS — M542 Cervicalgia: Secondary | ICD-10-CM | POA: Diagnosis not present

## 2024-10-01 ENCOUNTER — Ambulatory Visit: Payer: Self-pay

## 2024-10-01 ENCOUNTER — Other Ambulatory Visit: Payer: Self-pay | Admitting: Family Medicine

## 2024-10-01 MED ORDER — LISINOPRIL 5 MG PO TABS: 5.0000 mg | ORAL_TABLET | Freq: Every day | ORAL | 3 refills | Status: DC

## 2024-10-01 NOTE — Telephone Encounter (Signed)
Left VM that RX was sent to the pharmacy.  Dm/cma

## 2024-10-01 NOTE — Telephone Encounter (Signed)
 FYI Only or Action Required?: Action required by provider: request for appointment, clinical question for provider, and update on patient condition.  Patient was last seen in primary care on 08/04/2024 by Thedora Garnette HERO, MD.  Called Nurse Triage reporting Hypertension.  Symptoms began N/A.  Interventions attempted: Rest, hydration, or home remedies.  Symptoms are: unchanged.  Triage Disposition: See PCP Within 2 Weeks  Patient/caregiver understands and will follow disposition?: No, refuses disposition    Message from [place] S sent at 10/01/2024  2:27 PM EDT  Summary: elevated bp on left side vs right side   Reason for Triage: 161/? right side BP reading always lower on the left side      Reason for Disposition  [1] Patient reports BP difference of 15 mmHg or more between left and right arms AND [2] difference never confirmed by a doctor (or NP/PA)  Answer Assessment - Initial Assessment Questions Additional info:  1) Getting calls from [facility] for Lisinopril  5mg  refill, she should still be on this? 2) Follow by cardiology  3) Back on lisinopril  she does skip doses when her pressure is in the 110's, she is wondering if Dr. Thedora can give her parameters.  4) She has pcp follow up appointment 11/04/24 and is requesting blood work to check everything.  5) She is worried about taking calcium supplement due to build up in her blood 6) She is requesting to see pcp only, next available is Nov 11, can she be worked into his schedule sooner.  7) She is wondering if she needs referral to ENT for persistent lump on her neck.   1. BLOOD PRESSURE: What is your blood pressure? Did you take at least two measurements 5 minutes apart?     Has systolic pressure reading only. 161 on right, 122 on left 2. ONSET: When did you take your blood pressure?     Today  3. HOW: How did you take your blood pressure? (e.g., automatic home BP monitor, visiting nurse)     cuff 4. HISTORY: Do you  have a history of high blood pressure?     yes 5. MEDICINES: Are you taking any medicines for blood pressure? Have you missed any doses recently?     Skips when systolic pressure is in the 110's 6. OTHER SYMPTOMS: Do you have any symptoms? (e.g., blurred vision, chest pain, difficulty breathing, headache, weakness)      Blood pressure discrepancy. She has lump on her side neck that has been evaluated by pcp and has 4 month follow up, it is not worse but not better, she is wondering if she needs an ENT referral. Asymptomatic at this time.  Protocols used: Blood Pressure - High-A-AH

## 2024-10-01 NOTE — Telephone Encounter (Unsigned)
 Copied from CRM [id number]. Topic: Clinical - Request for Lab/Test Order >> Oct 01, 2024  2:19 PM Suzen RAMAN wrote: Reason for CRM: patient would like order placed for a complete blood panel to check her overall levels to include cholesterol and thyroid  levels.

## 2024-10-01 NOTE — Telephone Encounter (Signed)
 Left VM that we can do the labs at her upcoming appointment and advised to call if any other questions.  Dm/cma

## 2024-10-01 NOTE — Telephone Encounter (Signed)
 Copied from CRM #[id number]. Topic: Clinical - Medication Refill >> Oct 01, 2024  2:17 PM Suzen RAMAN wrote: Medication: lisinopril  (ZESTRIL ) 5 MG tablet   Has the patient contacted their pharmacy? Yes   This is the patient's preferred pharmacy:  [id number] - [place], [place] - [address] [facility] [place] [address] Phone: [phone] Fax: [phone]  Is this the correct pharmacy for this prescription? Yes If no, delete pharmacy and type the correct one.   Has the prescription been filled recently? No  Is the patient out of the medication? Yes  Has the patient been seen for an appointment in the last year OR does the patient have an upcoming appointment? Yes  Can we respond through MyChart? No  Agent: Please be advised that Rx refills may take up to 3 business days. We ask that you follow-up with your pharmacy.

## 2024-10-04 DIAGNOSIS — M542 Cervicalgia: Secondary | ICD-10-CM | POA: Diagnosis not present

## 2024-10-04 DIAGNOSIS — M5451 Vertebrogenic low back pain: Secondary | ICD-10-CM | POA: Diagnosis not present

## 2024-10-05 ENCOUNTER — Telehealth: Payer: Self-pay

## 2024-10-05 NOTE — Telephone Encounter (Signed)
 Copied from CRM [id number]. Topic: Clinical - Request for Lab/Test Order >> Oct 01, 2024  2:19 PM Suzen RAMAN wrote: Reason for CRM: patient would like order placed for a complete blood panel to check her overall levels to include cholesterol and thyroid  levels. >> Oct 05, 2024 12:41 PM Robinson H wrote: Patient following up on message sent regarding having labs done, agent advised patient of message from [facility] stating labs can be done at upcoming appointment and patient acknowledged and wants to know if she can have labs done prior to appointment so that she can go over results with provider at appointment.  Kesa [phone]

## 2024-10-05 NOTE — Telephone Encounter (Signed)
 Patient will wait to get labs done at her appointment on 11/04/24. Dm/cma

## 2024-10-07 DIAGNOSIS — M5451 Vertebrogenic low back pain: Secondary | ICD-10-CM | POA: Diagnosis not present

## 2024-10-07 DIAGNOSIS — M542 Cervicalgia: Secondary | ICD-10-CM | POA: Diagnosis not present

## 2024-10-12 DIAGNOSIS — M5451 Vertebrogenic low back pain: Secondary | ICD-10-CM | POA: Diagnosis not present

## 2024-10-12 DIAGNOSIS — M542 Cervicalgia: Secondary | ICD-10-CM | POA: Diagnosis not present

## 2024-10-14 DIAGNOSIS — M542 Cervicalgia: Secondary | ICD-10-CM | POA: Diagnosis not present

## 2024-10-14 DIAGNOSIS — M5451 Vertebrogenic low back pain: Secondary | ICD-10-CM | POA: Diagnosis not present

## 2024-10-20 DIAGNOSIS — M5451 Vertebrogenic low back pain: Secondary | ICD-10-CM | POA: Diagnosis not present

## 2024-10-20 DIAGNOSIS — M542 Cervicalgia: Secondary | ICD-10-CM | POA: Diagnosis not present

## 2024-10-26 DIAGNOSIS — M542 Cervicalgia: Secondary | ICD-10-CM | POA: Diagnosis not present

## 2024-10-26 DIAGNOSIS — M5451 Vertebrogenic low back pain: Secondary | ICD-10-CM | POA: Diagnosis not present

## 2024-10-28 ENCOUNTER — Ambulatory Visit: Admitting: Cardiology

## 2024-10-28 DIAGNOSIS — L821 Other seborrheic keratosis: Secondary | ICD-10-CM | POA: Diagnosis not present

## 2024-10-28 DIAGNOSIS — D485 Neoplasm of uncertain behavior of skin: Secondary | ICD-10-CM | POA: Diagnosis not present

## 2024-10-28 DIAGNOSIS — H02824 Cysts of left upper eyelid: Secondary | ICD-10-CM | POA: Diagnosis not present

## 2024-11-02 DIAGNOSIS — M5451 Vertebrogenic low back pain: Secondary | ICD-10-CM | POA: Diagnosis not present

## 2024-11-02 DIAGNOSIS — M542 Cervicalgia: Secondary | ICD-10-CM | POA: Diagnosis not present

## 2024-11-03 NOTE — Assessment & Plan Note (Signed)
 BP is in normal range. Continue to monitor and manage with lifestyle approaches.

## 2024-11-03 NOTE — Progress Notes (Signed)
 [facility] [address] [place] [address] Dept: [phone] Dept Fax: [phone]  Chronic Care Office Visit  Subjective:    Patient ID: [name] [name], female    DOB: 1956/11/02, 68 y.o..   MRN: [id number]  No chief complaint on file.  History of Present Illness:  Patient is in today for reassessment of chronic medical conditions.   [name] [name] has a history of hypertension. She has recently been started on aspirin  82 mg daily by her cardiologist, Dr. Monetta. She continues to smoke 3-4 cigarettes a day.   [name] [name] has a history of hyperlipidemia. She is currently managed on Zetia  10 mg daily and pravastatin  20 mg daily. I had referred her to cardiology regarding an elevated CAC score, with calcifications esp. in the LAD. Dr. Monetta plans further testing to evaluate her coronary artery flow.    Past Medical History: Patient Active Problem List   Diagnosis Date Noted   PONV (postoperative nausea and vomiting)    Left knee injury    Female pelvic pain    Borderline hypertension    Arthritis    Statin intolerance 07/20/2024   Coronary artery calcification seen on CT scan 07/20/2024   Spinal stenosis in cervical region 06/25/2024   Subcutaneous nodule of neck 02/25/2024   Cervical radiculopathy at C5 02/25/2024   COVID-19 12/04/2023   Eczema 11/25/2023   Spinal stenosis at L4-L5 level 02/26/2023   Neuroforaminal stenosis of lumbar spine 02/26/2023   Hyperlipidemia 12/25/2021   Scoliosis deformity of spine 04/24/2021   Degeneration of spine 04/24/2021   Anxiety 04/24/2021   Osteoporosis 09/02/2019   Adjustment disorder with mixed anxiety and depressed mood 07/04/2015   Allergic rhinitis 07/04/2015   Essential hypertension 03/24/2015   Tobacco abuse 09/01/2012   Fibromyalgia    Bone tumor (benign) 04/07/2012   Past Surgical History:  Procedure Laterality Date   DILATION AND CURETTAGE OF UTERUS  1986  &   1988   RETAINED PLACENTA   jaw tumor Right 2020   right lower jaw tumor removed 2020 by Dr. Dorthula   LAPAROSCOPIC ASSISTED VAGINAL HYSTERECTOMY  01-25-2003   LAPAROSCOPY N/A 04/18/2014   Procedure: LAPAROSCOPY DIAGNOSTIC;  Surgeon: Charlie CHRISTELLA Croak, MD;  Location: [facility];  Service: Gynecology;  Laterality: N/A;   NASAL SEPTUM SURGERY  AGE 9   OPEN REDUCTION INTERNAL FIXATION (ORIF) DISTAL RADIAL FRACTURE Left 09/18/2020   Procedure: OPEN TREATMENT OF LEFT DISTAL RADIUS FRACTURE;  Surgeon: Sebastian Lenis, MD;  Location: [facility];  Service: Orthopedics;  Laterality: Left;  LENGTH OF SURGERY: 75 MIN  PRE OP BLOCK   SALPINGOOPHORECTOMY Bilateral 04/18/2014   Procedure: SALPINGO OOPHORECTOMY;  Surgeon: Charlie CHRISTELLA Croak, MD;  Location: [facility];  Service: Gynecology;  Laterality: Bilateral;   TONSILLECTOMY  AGE 32   TUBAL LIGATION     Family History  Problem Relation Age of Onset   Diabetes Mother    Heart disease Mother    Cancer Father        Lung   Kidney disease Sister    Diabetes Sister    Diabetes Sister    Colon cancer Neg Hx    Stomach cancer Neg Hx    Outpatient Medications Prior to Visit  Medication Sig Dispense Refill   aspirin  EC 81 MG tablet Take 1 tablet (81 mg total) by mouth daily. Swallow whole.     Cholecalciferol (VITAMIN D3) 2000 UNITS TABS Take 1 capsule  by mouth daily.     ezetimibe  (ZETIA ) 10 MG tablet Take 1 tablet (10 mg total) by mouth daily. 90 tablet 3   fluticasone  (FLONASE ) 50 MCG/ACT nasal spray Place 1 spray into both nostrils daily.     lisinopril  (ZESTRIL ) 5 MG tablet Take 1 tablet (5 mg total) by mouth daily. 90 tablet 3   Magnesium 250 MG TABS Take 1 tablet by mouth daily.     nitroGLYCERIN  (NITROSTAT ) 0.4 MG SL tablet Place 1 tablet (0.4 mg total) under the tongue every 5 (five) minutes as needed. 25 tablet 1   Omega-3 Fatty Acids (OMEGA 3 FISH OIL PO) Take by mouth daily.     pravastatin   (PRAVACHOL ) 20 MG tablet Take 1 tablet (20 mg total) by mouth 2 (two) times a week. 45 tablet 3   vitamin E 180 MG (400 UNITS) capsule Take 400 Units by mouth daily.     No facility-administered medications prior to visit.   Allergies  Allergen Reactions   Hydrocodone  Rash   Demerol [Meperidine] Nausea And Vomiting    severe   Molds & Smuts    Sulfa Antibiotics Swelling and Rash     Objective:   There were no vitals filed for this visit. There is no height or weight on file to calculate BMI.   General: Well developed, well nourished. No acute distress. HEENT: Normocephalic, non-traumatic. PERRL, EOMI. Conjunctiva clear. External ears normal. EAC and TMs normal bilaterally. Nose    clear without congestion or rhinorrhea. Mucous membranes moist. Oropharynx clear. Good dentition. Neck: Supple. No lymphadenopathy. No thyromegaly. Lungs: Clear to auscultation bilaterally. No wheezing, rales or rhonchi. CV: RRR without murmurs or rubs. Pulses 2+ bilaterally. Abdomen: Soft, non-tender. Bowel sounds positive, normal pitch and frequency. No hepatosplenomegaly. No rebound or guarding. Back: Straight. No CVA tenderness bilaterally. Extremities: Full ROM. No joint swelling or tenderness. No edema noted. Skin: Warm and dry. No rashes. Neuro: CN II-XII intact. Normal sensation and DTR bilaterally. Psych: Alert and oriented. Normal mood and affect.  Health Maintenance Due  Topic Date Due   Pneumococcal Vaccine: 50+ Years (1 of 2 - PCV) Never done   Zoster Vaccines- Shingrix (1 of 2) Never done   Colonoscopy  10/14/2022   Influenza Vaccine  07/16/2024   COVID-19 Vaccine (3 - 2025-26 season) 08/16/2024   Mammogram  08/28/2024    Imaging VAS US   Result Date: 08/24/2024 SUMMARY:  Right Carotid: Mild: plaque visualized in the right internal carotid artery.  Left Carotid: Mild: plaque visualized in the left internal carotid artery.   Right ABI: The right ankle brachial index is within normal  limits, suggesting the absence of significant arterial obstruction at rest.  Left ABI: The left ankle brachial index is within normal limits, suggesting the absence of significant arterial obstruction at rest.   Aorta: There is no evidence of an abdominal aortic aneurysm.     Result Date: 08/22/2024 Cardiac/Coronary  CTA  IMPRESSION:  1. Coronary calcium score of 197. This was 41 percentile for age and sex matched control.  2. Normal coronary origin with right dominance.  3. CAD-RADS 3. Moderate stenosis. (50-69%) mid CX. Consider symptom-guided anti-ischemic pharmacotherapy as well as risk factor modification per guideline directed care. Additional analysis with CT FFR will be submitted.  4. Plaque analysis: TPV 246 mm3, 57th percentile, calcified plaque 35 mm3, non calcified plaque 211 mm3. Moderate TPV.  5. No acute extracardiac incidental findings.    CT CORONARY FRACTIONAL FLOW RESERVE FLUID ANALYSIS Result  Date: 08/18/2024 IMPRESSION:  This study demonstrates LOW likelihood of hemodynamically significant stenosis.     Lab Results {Labs (Optional):29002}    Assessment & Plan:   Problem List Items Addressed This Visit   None   No follow-ups on file.   Garnette CHRISTELLA Simpler, MD  I,Emily Lagle,acting as a scribe for Garnette CHRISTELLA Simpler, MD.,have documented all relevant documentation on the behalf of Garnette CHRISTELLA Simpler, MD.  I, Garnette CHRISTELLA Simpler, MD, have reviewed all documentation for this visit. The documentation on 11/04/2024 for the exam, diagnosis, procedures, and orders are all accurate and complete.

## 2024-11-03 NOTE — Assessment & Plan Note (Signed)
 I will recheck lipids today. Continue ezetimibe  10 mg daily and pravastatin  20 mg daily.

## 2024-11-03 NOTE — Assessment & Plan Note (Signed)
 No evidence of a decrease in coronary blood flow. Take an aspirin  81 mg daily. Continue to work at smoking cessation and lipid management.

## 2024-11-03 NOTE — Assessment & Plan Note (Signed)
 Continue to advise that she stop smoking. At current low level of tobacco use, medication would not be indicated.  I spent 3 minutes counseling the patient about tobacco cessation.

## 2024-11-04 ENCOUNTER — Encounter: Payer: Self-pay | Admitting: Family Medicine

## 2024-11-04 ENCOUNTER — Ambulatory Visit: Admitting: Family Medicine

## 2024-11-04 VITALS — BP 124/70 | HR 79 | Temp 98.1°F | Ht 68.0 in | Wt 167.2 lb

## 2024-11-04 DIAGNOSIS — E782 Mixed hyperlipidemia: Secondary | ICD-10-CM | POA: Diagnosis not present

## 2024-11-04 DIAGNOSIS — I10 Essential (primary) hypertension: Secondary | ICD-10-CM

## 2024-11-04 DIAGNOSIS — I2510 Atherosclerotic heart disease of native coronary artery without angina pectoris: Secondary | ICD-10-CM | POA: Diagnosis not present

## 2024-11-04 DIAGNOSIS — Z720 Tobacco use: Secondary | ICD-10-CM | POA: Diagnosis not present

## 2024-11-04 LAB — LIPID PANEL
Cholesterol: 161 mg/dL (ref 0–200)
HDL: 37.3 mg/dL — ABNORMAL LOW (ref >39.00)
LDL Cholesterol: 95 mg/dL (ref 0–99)
NonHDL: 123.86
Total CHOL/HDL Ratio: 4
Triglycerides: 145 mg/dL (ref 0.0–149.0)
VLDL: 29 mg/dL (ref 0.0–40.0)

## 2024-11-04 LAB — COMPREHENSIVE METABOLIC PANEL WITH GFR
ALT: 16 U/L (ref 0–35)
AST: 16 U/L (ref 0–37)
Albumin: 4.2 g/dL (ref 3.5–5.2)
Alkaline Phosphatase: 83 U/L (ref 39–117)
BUN: 17 mg/dL (ref 6–23)
CO2: 30 meq/L (ref 19–32)
Calcium: 9.7 mg/dL (ref 8.4–10.5)
Chloride: 105 meq/L (ref 96–112)
Creatinine, Ser: 0.79 mg/dL (ref 0.40–1.20)
GFR: 77.71 mL/min (ref >60.00)
Glucose, Bld: 106 mg/dL — ABNORMAL HIGH (ref 70–99)
Potassium: 4.6 meq/L (ref 3.5–5.1)
Sodium: 140 meq/L (ref 135–145)
Total Bilirubin: 0.5 mg/dL (ref 0.2–1.2)
Total Protein: 6.9 g/dL (ref 6.0–8.3)

## 2024-11-05 ENCOUNTER — Ambulatory Visit: Payer: Self-pay | Admitting: Family Medicine

## 2024-12-15 ENCOUNTER — Ambulatory Visit: Admitting: Nurse Practitioner

## 2024-12-15 ENCOUNTER — Encounter: Payer: Self-pay | Admitting: Nurse Practitioner

## 2024-12-15 VITALS — BP 164/78 | HR 60 | Temp 97.5°F | Ht 68.0 in | Wt 171.0 lb

## 2024-12-15 DIAGNOSIS — J301 Allergic rhinitis due to pollen: Secondary | ICD-10-CM | POA: Diagnosis not present

## 2024-12-15 DIAGNOSIS — R229 Localized swelling, mass and lump, unspecified: Secondary | ICD-10-CM | POA: Insufficient documentation

## 2024-12-15 DIAGNOSIS — I10 Essential (primary) hypertension: Secondary | ICD-10-CM | POA: Diagnosis not present

## 2024-12-15 LAB — CBC WITH DIFFERENTIAL/PLATELET
Absolute Lymphocytes: 2153 {cells}/uL (ref 850–3900)
Absolute Monocytes: 756 {cells}/uL (ref 200–950)
Basophils Absolute: 84 {cells}/uL (ref 0–200)
Basophils Relative: 0.8 %
Eosinophils Absolute: 893 {cells}/uL — ABNORMAL HIGH (ref 15–500)
Eosinophils Relative: 8.5 %
HCT: 44.6 % (ref 35.9–46.0)
Hemoglobin: 14.3 g/dL (ref 11.7–15.5)
MCH: 30.2 pg (ref 27.0–33.0)
MCHC: 32.1 g/dL (ref 31.6–35.4)
MCV: 94.1 fL (ref 81.4–101.7)
MPV: 11.4 fL (ref 7.5–12.5)
Monocytes Relative: 7.2 %
Neutro Abs: 6615 {cells}/uL (ref 1500–7800)
Neutrophils Relative %: 63 %
Platelets: 286 Thousand/uL (ref 140–400)
RBC: 4.74 Million/uL (ref 3.80–5.10)
RDW: 12 % (ref 11.0–15.0)
Total Lymphocyte: 20.5 %
WBC: 10.5 Thousand/uL (ref 3.8–10.8)

## 2024-12-15 LAB — TSH: TSH: 1.47 m[IU]/L (ref 0.40–4.50)

## 2024-12-15 NOTE — Assessment & Plan Note (Signed)
 Blood pressure is elevated at 164/78. Previously managed with lisinopril , which was discontinued due to low readings. Blood pressure should be monitored at home reach out to office if consistently greater than 140 systolic. Continue smoking cessation efforts with nicotine  patches.

## 2024-12-15 NOTE — Progress Notes (Signed)
 "  Acute Office Visit  Subjective:     Patient ID: [name] [name], female    DOB: [birth year], 68 y.o.   MRN: [id number]  Chief Complaint  Patient presents with   Neck Pain    Right side of neck pain and has a knot, concerns with left ear pain    HPI Discussed the use of AI scribe software for clinical note transcription with the patient, who gave verbal consent to proceed.  History of Present Illness   [name] [name] is a 68 year old female with coronary artery disease and osteoporosis who presents with neck pain and a lump on the side of her neck.  She reports several months of pain on the side of her neck with a noticeable sore knot. The area feels tense and tighter than the other side. She denies recent fevers. She previously had a pinched nerve in her upper back treated with physical therapy and has osteoporosis in her neck.  She has left ear pain, worse in the mornings, with nasal congestion. She has not been using Flonase  recently.  She is concerned about her thyroid  since it has not been checked recently. Her concern is partly due to her daughters past elevated prolactin levels, which initially raised fertility concerns.  She has high blood pressure that she monitors at home. She stopped lisinopril  about 2 to 3 months ago because her readings were well controlled and is considering resuming more regular monitoring.     ROS See pertinent positives and negatives per HPI.     Objective:    BP (!) 164/78 (BP Location: Left Arm, Cuff Size: Normal)   Pulse 60   Temp (!) 97.5 F (36.4 C)   Ht 5' 8 (1.727 m)   Wt 171 lb (77.6 kg)   SpO2 98%   BMI 26.00 kg/m    Physical Exam Vitals and nursing note reviewed.  Constitutional:      General: She is not in acute distress.    Appearance: Normal appearance.  HENT:     Head: Normocephalic.     Right Ear: Tympanic membrane, ear canal and external ear normal.     Left Ear: Tympanic membrane, ear canal and  external ear normal.     Mouth/Throat:     Mouth: Mucous membranes are moist.     Pharynx: Posterior oropharyngeal erythema present. No oropharyngeal exudate.  Eyes:     Conjunctiva/sclera: Conjunctivae normal.  Cardiovascular:     Rate and Rhythm: Normal rate and regular rhythm.     Pulses: Normal pulses.     Heart sounds: Normal heart sounds.  Pulmonary:     Effort: Pulmonary effort is normal.     Breath sounds: Normal breath sounds.  Musculoskeletal:     Cervical back: Normal range of motion and neck supple. No tenderness.  Lymphadenopathy:     Cervical: Cervical adenopathy present.  Skin:    General: Skin is warm.  Neurological:     General: No focal deficit present.     Mental Status: She is alert and oriented to person, place, and time.  Psychiatric:        Mood and Affect: Mood normal.        Behavior: Behavior normal.        Thought Content: Thought content normal.        Judgment: Judgment normal.       Assessment & Plan:   Problem List Items Addressed This Visit  Cardiovascular and Mediastinum   Essential hypertension   Blood pressure is elevated at 164/78. Previously managed with lisinopril , which was discontinued due to low readings. Blood pressure should be monitored at home reach out to office if consistently greater than 140 systolic. Continue smoking cessation efforts with nicotine  patches.        Respiratory   Allergic rhinitis   Intermittent left ear pain and congestion may have an allergic component. Flonase  use should continue, and cetirizine may be considered for allergy symptoms.         Other   Lump of skin - Primary   Persistent neck swelling with soreness suggests a thyroid  nodule or swollen lymph node, though no significant changes are noted. An ultrasound of the neck is ordered, and thyroid  levels and CBC are checked. Warm compresses are advised for pain relief, and alternating acetaminophen  and ibuprofen  is recommended for pain  management.      Relevant Orders   US  Soft Tissue Head/Neck (NON-THYROID )   CBC with Differential/Platelet   TSH    No orders of the defined types were placed in this encounter.   Return if symptoms worsen or fail to improve.  [name] DELENA Harada, NP   "

## 2024-12-15 NOTE — Assessment & Plan Note (Signed)
 Persistent neck swelling with soreness suggests a thyroid  nodule or swollen lymph node, though no significant changes are noted. An ultrasound of the neck is ordered, and thyroid  levels and CBC are checked. Warm compresses are advised for pain relief, and alternating acetaminophen  and ibuprofen  is recommended for pain management.

## 2024-12-15 NOTE — Assessment & Plan Note (Signed)
 Intermittent left ear pain and congestion may have an allergic component. Flonase  use should continue, and cetirizine may be considered for allergy symptoms.

## 2024-12-15 NOTE — Patient Instructions (Signed)
 It was great to see you!  You can use warm compresses as needed along with tylenol  and ibuprofen   I have ordered an ultrasound of your neck  We are checking your labs today and will let you know the results via mychart/phone.   Let's follow-up based on results   Take care,  Tinnie Harada, NP

## 2024-12-17 ENCOUNTER — Ambulatory Visit: Payer: Self-pay | Admitting: Nurse Practitioner

## 2024-12-27 ENCOUNTER — Ambulatory Visit
Admission: RE | Admit: 2024-12-27 | Discharge: 2024-12-27 | Disposition: A | Source: Ambulatory Visit | Attending: Nurse Practitioner | Admitting: Nurse Practitioner

## 2024-12-27 DIAGNOSIS — R229 Localized swelling, mass and lump, unspecified: Secondary | ICD-10-CM

## 2025-02-01 ENCOUNTER — Ambulatory Visit: Admitting: Family Medicine

## 2025-02-02 ENCOUNTER — Ambulatory Visit: Admitting: Family Medicine

## 2025-03-10 ENCOUNTER — Ambulatory Visit: Payer: Self-pay | Admitting: Cardiology

## 2025-03-28 ENCOUNTER — Ambulatory Visit
